# Patient Record
Sex: Male | Born: 1935 | Race: White | Hispanic: No | Marital: Married | Smoking: Former smoker
Health system: Southern US, Community
[De-identification: ages and names within clinical notes are randomized; demographics above are authoritative.]

## PROBLEM LIST (undated history)

## (undated) DIAGNOSIS — Z8619 Personal history of other infectious and parasitic diseases: Secondary | ICD-10-CM

## (undated) DIAGNOSIS — I10 Essential (primary) hypertension: Secondary | ICD-10-CM

## (undated) DIAGNOSIS — Z8781 Personal history of (healed) traumatic fracture: Secondary | ICD-10-CM

## (undated) DIAGNOSIS — K629 Disease of anus and rectum, unspecified: Secondary | ICD-10-CM

## (undated) DIAGNOSIS — Z0000 Encounter for general adult medical examination without abnormal findings: Principal | ICD-10-CM

## (undated) DIAGNOSIS — K219 Gastro-esophageal reflux disease without esophagitis: Secondary | ICD-10-CM

## (undated) DIAGNOSIS — T7840XA Allergy, unspecified, initial encounter: Secondary | ICD-10-CM

## (undated) DIAGNOSIS — Z86010 Personal history of colonic polyps: Secondary | ICD-10-CM

## (undated) DIAGNOSIS — C61 Malignant neoplasm of prostate: Secondary | ICD-10-CM

## (undated) DIAGNOSIS — E785 Hyperlipidemia, unspecified: Secondary | ICD-10-CM

## (undated) HISTORY — DX: Encounter for general adult medical examination without abnormal findings: Z00.00

## (undated) HISTORY — DX: Hyperlipidemia, unspecified: E78.5

## (undated) HISTORY — DX: Personal history of other infectious and parasitic diseases: Z86.19

## (undated) HISTORY — DX: Gastro-esophageal reflux disease without esophagitis: K21.9

## (undated) HISTORY — DX: Personal history of (healed) traumatic fracture: Z87.81

## (undated) HISTORY — DX: Personal history of colonic polyps: Z86.010

## (undated) HISTORY — DX: Essential (primary) hypertension: I10

## (undated) HISTORY — PX: TONSILLECTOMY AND ADENOIDECTOMY: SHX28

## (undated) HISTORY — DX: Malignant neoplasm of prostate: C61

## (undated) HISTORY — DX: Disease of anus and rectum, unspecified: K62.9

## (undated) HISTORY — DX: Allergy, unspecified, initial encounter: T78.40XA

---

## 2004-02-03 LAB — HM COLONOSCOPY: HM Colonoscopy: NORMAL

## 2004-05-19 ENCOUNTER — Encounter: Payer: Self-pay | Admitting: Internal Medicine

## 2005-09-23 ENCOUNTER — Ambulatory Visit: Payer: Self-pay | Admitting: Internal Medicine

## 2006-03-19 ENCOUNTER — Ambulatory Visit: Payer: Self-pay | Admitting: Internal Medicine

## 2006-08-26 DIAGNOSIS — Z860100 Personal history of colon polyps, unspecified: Secondary | ICD-10-CM | POA: Insufficient documentation

## 2006-08-26 DIAGNOSIS — K629 Disease of anus and rectum, unspecified: Secondary | ICD-10-CM

## 2006-08-26 HISTORY — DX: Personal history of colonic polyps: Z86.010

## 2006-08-26 HISTORY — DX: Disease of anus and rectum, unspecified: K62.9

## 2006-08-26 HISTORY — DX: Personal history of colon polyps, unspecified: Z86.0100

## 2006-09-20 ENCOUNTER — Ambulatory Visit: Payer: Self-pay | Admitting: Internal Medicine

## 2006-09-20 LAB — CONVERTED CEMR LAB
ALT: 20 units/L (ref 0–53)
AST: 21 units/L (ref 0–37)
Albumin: 3.9 g/dL (ref 3.5–5.2)
Alkaline Phosphatase: 56 units/L (ref 39–117)
BUN: 7 mg/dL (ref 6–23)
Basophils Absolute: 0 10*3/uL (ref 0.0–0.1)
Basophils Relative: 0.4 % (ref 0.0–1.0)
Bilirubin Urine: NEGATIVE
Bilirubin, Direct: 0.1 mg/dL (ref 0.0–0.3)
CO2: 31 meq/L (ref 19–32)
Calcium: 9.8 mg/dL (ref 8.4–10.5)
Chloride: 107 meq/L (ref 96–112)
Cholesterol: 274 mg/dL (ref 0–200)
Creatinine, Ser: 0.8 mg/dL (ref 0.4–1.5)
Direct LDL: 204.1 mg/dL
Eosinophils Absolute: 0.3 10*3/uL (ref 0.0–0.6)
Eosinophils Relative: 4.7 % (ref 0.0–5.0)
GFR calc Af Amer: 123 mL/min
GFR calc non Af Amer: 102 mL/min
Glucose, Bld: 122 mg/dL — ABNORMAL HIGH (ref 70–99)
Glucose, Urine, Semiquant: NEGATIVE
HCT: 43.4 % (ref 39.0–52.0)
HDL: 42.7 mg/dL (ref >39.0)
Hemoglobin: 14.9 g/dL (ref 13.0–17.0)
Ketones, urine, test strip: NEGATIVE
Lymphocytes Relative: 24.3 % (ref 12.0–46.0)
MCHC: 34.4 g/dL (ref 30.0–36.0)
MCV: 91.7 fL (ref 78.0–100.0)
Monocytes Absolute: 0.6 10*3/uL (ref 0.2–0.7)
Monocytes Relative: 10 % (ref 3.0–11.0)
Neutro Abs: 3.3 10*3/uL (ref 1.4–7.7)
Neutrophils Relative %: 60.6 % (ref 43.0–77.0)
Nitrite: NEGATIVE
PSA: 3.68 ng/mL (ref 0.10–4.00)
Platelets: 190 10*3/uL (ref 150–400)
Potassium: 3.8 meq/L (ref 3.5–5.1)
Protein, U semiquant: NEGATIVE
RBC: 4.73 M/uL (ref 4.22–5.81)
RDW: 12.6 % (ref 11.5–14.6)
Sodium: 143 meq/L (ref 135–145)
Specific Gravity, Urine: 1.015
TSH: 1.55 microintl units/mL (ref 0.35–5.50)
Total Bilirubin: 0.8 mg/dL (ref 0.3–1.2)
Total CHOL/HDL Ratio: 6.4
Total Protein: 6.6 g/dL (ref 6.0–8.3)
Triglycerides: 95 mg/dL (ref 0–149)
Urobilinogen, UA: NEGATIVE
VLDL: 19 mg/dL (ref 0–40)
WBC Urine, dipstick: NEGATIVE
WBC: 5.5 10*3/uL (ref 4.5–10.5)
pH: 7

## 2006-09-27 ENCOUNTER — Ambulatory Visit: Payer: Self-pay | Admitting: Internal Medicine

## 2006-09-28 ENCOUNTER — Ambulatory Visit: Payer: Self-pay | Admitting: Internal Medicine

## 2006-09-28 LAB — CONVERTED CEMR LAB
ALT: 25 units/L (ref 0–53)
AST: 28 units/L (ref 0–37)
Albumin: 3.7 g/dL (ref 3.5–5.2)
Alkaline Phosphatase: 49 units/L (ref 39–117)
BUN: 8 mg/dL (ref 6–23)
Bilirubin, Direct: 0.1 mg/dL (ref 0.0–0.3)
CO2: 30 meq/L (ref 19–32)
Calcium: 9.4 mg/dL (ref 8.4–10.5)
Chloride: 107 meq/L (ref 96–112)
Cholesterol: 266 mg/dL (ref 0–200)
Creatinine, Ser: 0.8 mg/dL (ref 0.4–1.5)
Direct LDL: 192.4 mg/dL
GFR calc Af Amer: 123 mL/min
GFR calc non Af Amer: 102 mL/min
Glucose, Bld: 111 mg/dL — ABNORMAL HIGH (ref 70–99)
HDL: 40.9 mg/dL (ref >39.0)
Potassium: 3.6 meq/L (ref 3.5–5.1)
Sodium: 143 meq/L (ref 135–145)
Total Bilirubin: 0.7 mg/dL (ref 0.3–1.2)
Total CHOL/HDL Ratio: 6.5
Total Protein: 6.4 g/dL (ref 6.0–8.3)
Triglycerides: 186 mg/dL — ABNORMAL HIGH (ref 0–149)
VLDL: 37 mg/dL (ref 0–40)

## 2006-09-29 ENCOUNTER — Encounter: Payer: Self-pay | Admitting: Internal Medicine

## 2006-11-03 ENCOUNTER — Ambulatory Visit: Payer: Self-pay | Admitting: Internal Medicine

## 2006-11-03 LAB — CONVERTED CEMR LAB
ALT: 19 units/L (ref 0–53)
AST: 20 units/L (ref 0–37)
Albumin: 3.8 g/dL (ref 3.5–5.2)
Alkaline Phosphatase: 60 units/L (ref 39–117)
Bilirubin, Direct: 0.2 mg/dL (ref 0.0–0.3)
Cholesterol: 216 mg/dL (ref 0–200)
Direct LDL: 151.6 mg/dL
HDL: 46.3 mg/dL (ref >39.0)
Total Bilirubin: 0.9 mg/dL (ref 0.3–1.2)
Total CHOL/HDL Ratio: 4.7
Total Protein: 6.4 g/dL (ref 6.0–8.3)
Triglycerides: 96 mg/dL (ref 0–149)
VLDL: 19 mg/dL (ref 0–40)

## 2006-11-09 ENCOUNTER — Ambulatory Visit: Payer: Self-pay | Admitting: Internal Medicine

## 2006-11-09 DIAGNOSIS — E785 Hyperlipidemia, unspecified: Secondary | ICD-10-CM

## 2006-11-09 HISTORY — DX: Hyperlipidemia, unspecified: E78.5

## 2006-11-22 ENCOUNTER — Telehealth: Payer: Self-pay | Admitting: Internal Medicine

## 2007-02-02 ENCOUNTER — Telehealth: Payer: Self-pay | Admitting: Internal Medicine

## 2007-02-03 HISTORY — PX: PROSTATECTOMY: SHX69

## 2007-02-15 ENCOUNTER — Telehealth: Payer: Self-pay | Admitting: Internal Medicine

## 2007-02-23 ENCOUNTER — Ambulatory Visit: Payer: Self-pay | Admitting: Internal Medicine

## 2007-04-18 ENCOUNTER — Ambulatory Visit: Payer: Self-pay | Admitting: Internal Medicine

## 2007-04-18 LAB — CONVERTED CEMR LAB
Bilirubin, Direct: 0.1 mg/dL (ref 0.0–0.3)
Cholesterol: 206 mg/dL (ref 0–200)
Direct LDL: 148.1 mg/dL
HDL: 38.2 mg/dL — ABNORMAL LOW (ref >39.0)
Total CHOL/HDL Ratio: 5.4
Total Protein: 6.3 g/dL (ref 6.0–8.3)
Triglycerides: 89 mg/dL (ref 0–149)
VLDL: 18 mg/dL (ref 0–40)

## 2007-04-25 ENCOUNTER — Ambulatory Visit: Payer: Self-pay | Admitting: Internal Medicine

## 2007-04-25 DIAGNOSIS — K219 Gastro-esophageal reflux disease without esophagitis: Secondary | ICD-10-CM

## 2007-04-25 HISTORY — DX: Gastro-esophageal reflux disease without esophagitis: K21.9

## 2007-04-27 ENCOUNTER — Telehealth: Payer: Self-pay | Admitting: Internal Medicine

## 2007-05-03 ENCOUNTER — Telehealth: Payer: Self-pay | Admitting: Internal Medicine

## 2007-05-12 ENCOUNTER — Ambulatory Visit: Payer: Self-pay | Admitting: Internal Medicine

## 2007-05-17 LAB — CONVERTED CEMR LAB: PSA: 4.61 ng/mL — ABNORMAL HIGH (ref 0.10–4.00)

## 2007-05-24 ENCOUNTER — Encounter: Payer: Self-pay | Admitting: Internal Medicine

## 2007-09-26 ENCOUNTER — Encounter: Payer: Self-pay | Admitting: Internal Medicine

## 2007-10-11 ENCOUNTER — Ambulatory Visit: Admission: RE | Admit: 2007-10-11 | Discharge: 2007-10-18 | Payer: Self-pay | Admitting: Radiation Oncology

## 2007-10-12 ENCOUNTER — Encounter: Payer: Self-pay | Admitting: Internal Medicine

## 2007-10-19 ENCOUNTER — Ambulatory Visit: Payer: Self-pay | Admitting: Internal Medicine

## 2007-10-19 LAB — CONVERTED CEMR LAB
AST: 17 units/L (ref 0–37)
Albumin: 3.9 g/dL (ref 3.5–5.2)
Alkaline Phosphatase: 61 units/L (ref 39–117)
Bilirubin, Direct: 0.1 mg/dL (ref 0.0–0.3)
Cholesterol: 191 mg/dL (ref 0–200)
Total Protein: 6.4 g/dL (ref 6.0–8.3)

## 2007-10-26 ENCOUNTER — Ambulatory Visit: Payer: Self-pay | Admitting: Internal Medicine

## 2007-10-26 DIAGNOSIS — C61 Malignant neoplasm of prostate: Secondary | ICD-10-CM

## 2007-10-26 DIAGNOSIS — Z8546 Personal history of malignant neoplasm of prostate: Secondary | ICD-10-CM | POA: Insufficient documentation

## 2007-10-26 HISTORY — DX: Malignant neoplasm of prostate: C61

## 2007-12-07 ENCOUNTER — Inpatient Hospital Stay (HOSPITAL_COMMUNITY): Admission: RE | Admit: 2007-12-07 | Discharge: 2007-12-08 | Payer: Self-pay | Admitting: Urology

## 2007-12-07 ENCOUNTER — Encounter (INDEPENDENT_AMBULATORY_CARE_PROVIDER_SITE_OTHER): Payer: Self-pay | Admitting: Urology

## 2007-12-08 ENCOUNTER — Emergency Department (HOSPITAL_BASED_OUTPATIENT_CLINIC_OR_DEPARTMENT_OTHER): Admission: EM | Admit: 2007-12-08 | Discharge: 2007-12-08 | Payer: Self-pay | Admitting: Emergency Medicine

## 2008-01-09 ENCOUNTER — Telehealth: Payer: Self-pay | Admitting: Internal Medicine

## 2008-03-26 ENCOUNTER — Telehealth: Payer: Self-pay | Admitting: Internal Medicine

## 2008-03-27 ENCOUNTER — Ambulatory Visit: Payer: Self-pay | Admitting: Internal Medicine

## 2008-04-04 ENCOUNTER — Ambulatory Visit: Payer: Self-pay | Admitting: Internal Medicine

## 2008-04-06 LAB — CONVERTED CEMR LAB
ALT: 29 units/L (ref 0–53)
AST: 26 units/L (ref 0–37)
Bilirubin, Direct: 0.2 mg/dL (ref 0.0–0.3)
Direct LDL: 164.3 mg/dL
HDL: 42.2 mg/dL (ref >39.0)
Total Bilirubin: 0.8 mg/dL (ref 0.3–1.2)
Total CHOL/HDL Ratio: 5.3
Triglycerides: 67 mg/dL (ref 0–149)

## 2008-06-19 ENCOUNTER — Encounter: Payer: Self-pay | Admitting: Internal Medicine

## 2008-07-17 ENCOUNTER — Ambulatory Visit: Payer: Self-pay | Admitting: Internal Medicine

## 2008-07-17 LAB — CONVERTED CEMR LAB
ALT: 19 units/L (ref 0–53)
Bilirubin, Direct: 0.1 mg/dL (ref 0.0–0.3)
Cholesterol: 177 mg/dL (ref 0–200)
HDL: 36.5 mg/dL — ABNORMAL LOW (ref >39.00)
LDL Cholesterol: 115 mg/dL — ABNORMAL HIGH (ref 0–99)
Total Protein: 7 g/dL (ref 6.0–8.3)
VLDL: 25.2 mg/dL (ref 0.0–40.0)

## 2008-07-24 ENCOUNTER — Ambulatory Visit: Payer: Self-pay | Admitting: Internal Medicine

## 2008-08-01 ENCOUNTER — Telehealth: Payer: Self-pay | Admitting: Internal Medicine

## 2008-10-12 ENCOUNTER — Ambulatory Visit: Payer: Self-pay | Admitting: Internal Medicine

## 2009-01-11 ENCOUNTER — Ambulatory Visit: Payer: Self-pay | Admitting: Internal Medicine

## 2009-01-14 LAB — CONVERTED CEMR LAB
ALT: 19 units/L (ref 0–53)
Alkaline Phosphatase: 74 units/L (ref 39–117)
Bilirubin, Direct: 0 mg/dL (ref 0.0–0.3)
CO2: 31 meq/L (ref 19–32)
Chloride: 106 meq/L (ref 96–112)
Creatinine, Ser: 1 mg/dL (ref 0.4–1.5)
LDL Cholesterol: 127 mg/dL — ABNORMAL HIGH (ref 0–99)
Potassium: 4.2 meq/L (ref 3.5–5.1)
Sodium: 142 meq/L (ref 135–145)
Total Bilirubin: 0.7 mg/dL (ref 0.3–1.2)
Total CHOL/HDL Ratio: 5

## 2009-03-04 ENCOUNTER — Telehealth: Payer: Self-pay | Admitting: Internal Medicine

## 2009-03-06 ENCOUNTER — Ambulatory Visit: Payer: Self-pay | Admitting: Internal Medicine

## 2009-04-08 ENCOUNTER — Encounter: Payer: Self-pay | Admitting: Internal Medicine

## 2009-04-10 ENCOUNTER — Telehealth: Payer: Self-pay | Admitting: Internal Medicine

## 2009-04-12 ENCOUNTER — Encounter: Payer: Self-pay | Admitting: Internal Medicine

## 2009-04-23 ENCOUNTER — Encounter: Payer: Self-pay | Admitting: Internal Medicine

## 2009-04-24 ENCOUNTER — Ambulatory Visit: Payer: Self-pay | Admitting: Internal Medicine

## 2009-05-13 ENCOUNTER — Telehealth: Payer: Self-pay | Admitting: Internal Medicine

## 2009-07-18 ENCOUNTER — Encounter: Payer: Self-pay | Admitting: Internal Medicine

## 2009-08-28 ENCOUNTER — Telehealth: Payer: Self-pay | Admitting: Internal Medicine

## 2009-08-29 ENCOUNTER — Ambulatory Visit: Payer: Self-pay | Admitting: Internal Medicine

## 2009-09-01 ENCOUNTER — Emergency Department (HOSPITAL_BASED_OUTPATIENT_CLINIC_OR_DEPARTMENT_OTHER): Admission: EM | Admit: 2009-09-01 | Discharge: 2009-09-01 | Payer: Self-pay | Admitting: Emergency Medicine

## 2009-09-01 ENCOUNTER — Ambulatory Visit: Payer: Self-pay | Admitting: Diagnostic Radiology

## 2009-10-30 ENCOUNTER — Ambulatory Visit: Payer: Self-pay | Admitting: Internal Medicine

## 2009-11-01 LAB — CONVERTED CEMR LAB
ALT: 18 units/L (ref 0–53)
AST: 18 units/L (ref 0–37)
Basophils Relative: 0.5 % (ref 0.0–3.0)
Cholesterol: 187 mg/dL (ref 0–200)
HCT: 43.4 % (ref 39.0–52.0)
Hemoglobin: 14.9 g/dL (ref 13.0–17.0)
LDL Cholesterol: 117 mg/dL — ABNORMAL HIGH (ref 0–99)
Lymphocytes Relative: 15.8 % (ref 12.0–46.0)
MCHC: 34.3 g/dL (ref 30.0–36.0)
Monocytes Relative: 8.1 % (ref 3.0–12.0)
Neutro Abs: 6.4 10*3/uL (ref 1.4–7.7)
RBC: 4.84 M/uL (ref 4.22–5.81)
Total Bilirubin: 0.8 mg/dL (ref 0.3–1.2)
Total CHOL/HDL Ratio: 4
Total Protein: 7 g/dL (ref 6.0–8.3)

## 2010-01-20 ENCOUNTER — Encounter: Payer: Self-pay | Admitting: Internal Medicine

## 2010-02-24 ENCOUNTER — Ambulatory Visit
Admission: RE | Admit: 2010-02-24 | Discharge: 2010-02-24 | Payer: Self-pay | Source: Home / Self Care | Attending: Internal Medicine | Admitting: Internal Medicine

## 2010-02-25 ENCOUNTER — Ambulatory Visit: Admit: 2010-02-25 | Payer: Self-pay | Admitting: Internal Medicine

## 2010-02-26 ENCOUNTER — Encounter: Payer: Self-pay | Admitting: Internal Medicine

## 2010-02-26 ENCOUNTER — Ambulatory Visit
Admission: RE | Admit: 2010-02-26 | Discharge: 2010-02-26 | Payer: Self-pay | Source: Home / Self Care | Attending: Internal Medicine | Admitting: Internal Medicine

## 2010-03-01 ENCOUNTER — Encounter: Payer: Self-pay | Admitting: Internal Medicine

## 2010-03-04 NOTE — Assessment & Plan Note (Signed)
Summary: 3 month rov/njr/pt rescd from bump//ccm   Vital Signs:  Patient profile:   75 year old male Height:      68 inches Weight:      196 pounds BMI:     29.91 Temp:     96.0 degrees F oral BP sitting:   160 / 90  (left arm) Cuff size:   regular  Vitals Entered By: Kern Reap CMA Duncan Dull) (April 24, 2009 9:52 AM)  Serial Vital Signs/Assessments:  Time      Position  BP       Pulse  Resp  Temp     By                     138/88                         Birdie Sons MD  CC: follow-up visit Is Patient Diabetic? No   CC:  follow-up visit.  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 One By Mouth Daily 2)  Simvastatin 40 Mg Tabs (Simvastatin) .... Take One Tablet At Bedtime 3)  Omeprazole 20 Mg Cpdr (Omeprazole) .... One By Mouth Daily 4)  Vitamin C 500 Mg Tabs (Ascorbic Acid) .... Once Daily 5)  Meloxicam 15 Mg Tabs (Meloxicam) .... Take One Tab Once Daily  Allergies (verified): 1)  ! Penicillin 2)  ! Erythromycin 3)  ! Sulfa 4)  ! Levaquin 5)  ! Tetracycline Hcl (Tetracycline Hcl) 6)  ! * Mycins  Past History:  Past Medical History: Last updated: 04/25/2007 Colonic polyps, hx of Hyperlipidemia GERD  Past Surgical History: Last updated: 01/11/2009 Tonsillectomy, adenoidectomy limb fx Prostatectomy---2009  Family History: Last updated: 09/27/2006 father RMSF mother--96 yo--dementia  Social History: Last updated: 09/27/2006 wife with aphasia Married  Risk Factors: Smoking Status: quit > 6 months  (03/06/2009)  Physical Exam  General:  alert, well-developed, well-nourished, and well-hydrated, no distress Head:  normocephalic and atraumatic.   Eyes:  pupils equal and pupils round.   Ears:  R ear normal and L ear normal.   Neck:  supple, full ROM, and no masses.   Lungs:  normal respiratory effort.   Heart:  normal rate and regular rhythm.   Abdomen:  soft, non-tender, normal bowel sounds, no distention, no masses, no guarding, no rigidity, and no rebound tenderness.   Msk:  normal ROM and no joint tenderness.   Neurologic:  cranial nerves II-XII intact and gait normal.     Impression & Recommendations:  Problem # 1:  HYPERLIPIDEMIA (ICD-272.4) well controlled previously labs next visit.  His updated medication list for this problem includes:    Simvastatin 40 Mg Tabs (Simvastatin) .Marland Kitchen... Take one tablet at bedtime  Labs Reviewed: SGOT: 19 (01/11/2009)   SGPT: 19 (01/11/2009)   HDL:34.60 (01/11/2009), 36.50 (07/17/2008)  LDL:127 (01/11/2009), 115 (07/17/2008)  Chol:181 (01/11/2009), 177 (07/17/2008)  Trig:99.0 (01/11/2009), 126.0 (07/17/2008)  Problem # 2:  GERD (ICD-530.81) well controlled.  His updated medication list for this problem includes:    Omeprazole  20 Mg Cpdr (Omeprazole) ..... One by mouth daily  Labs Reviewed: Hgb: 14.9 (09/20/2006)   Hct: 43.4 (09/20/2006)  Problem # 3:  ADENOCARCINOMA, PROSTATE (ICD-185) no known recurrence sees urology regularly recent uri--taking pseudofed. ? related to BP---see serial assessment.   Complete Medication List: 1)  Diasense Multivitamin Tabs (Multiple vitamins-minerals) .... One by mouth daily 2)  Simvastatin 40 Mg Tabs (Simvastatin) .... Take one tablet at bedtime 3)  Omeprazole 20 Mg Cpdr (Omeprazole) .... One by mouth daily 4)  Vitamin C 500 Mg Tabs (Ascorbic acid) .... Once daily 5)  Meloxicam 15 Mg Tabs (Meloxicam) .... Take one tab once daily

## 2010-03-04 NOTE — Letter (Signed)
Summary: [facility]  [facility]   Imported By: Maryln Gottron 04/30/2009 14:21:03  _____________________________________________________________________  External Attachment:    Type:   Image     Comment:   External Document

## 2010-03-04 NOTE — Progress Notes (Signed)
Summary: request  Phone Note Other Incoming Call back at [phone] x [phone]   Caller: Ed Blalock, [facility] live call Summary of Call: Pt called them for PT for right hand pain and right knee pain.  Request order for same faxed to [phone] Initial call taken by: Gladis Riffle, RN,  March 04, 2009 3:47 PM  Follow-up for Phone Call        called pt.  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  He describes this as dull, constant pain x 2 years.  Wonders if has twisted right knee from kicking ball to dog.  Now too painful to walk treadmill and has been x 3-4 weeks. Follow-up by: Gladis Riffle, RN,  March 04, 2009 3:52 PM  Additional Follow-up for Phone Call Additional follow up Details #1::        sounds like he should be seen. i'll see him at 65 on Wed Additional Follow-up by: Birdie Sons MD,  March 04, 2009 8:50 PM    Additional Follow-up for Phone Call Additional follow up Details #2::    appt made with pt as directed Follow-up by: Gladis Riffle, RN,  March 05, 2009 8:22 AM

## 2010-03-04 NOTE — Assessment & Plan Note (Signed)
Summary: right hand and knee pain/et--coming at 7:45 per MD   Vital Signs:  Patient profile:   75 year old male Weight:      196 pounds Temp:     97.5 degrees F Pulse rate:   72 / minute Resp:     12 per minute BP sitting:   140 / 84  (left arm)  Vitals Entered By: Gladis Riffle, RN (March 06, 2009 7:58 AM) CC: c/o right thumb pain and right knee pain, requests PT Is Patient Diabetic? No   CC:  c/o right thumb pain and right knee pain and requests PT.  History of Present Illness: base r thumb pain--progressive for 3 years---he thinks related to fly fishing. no swelling, no erythema  Right knee pain---ongoing for months---progressively worse...has noted since kicking ball with medial aspect of foot. no swelling , no erythema. he has noted that after 15 minute walk on treadmill caused significant r knee pain.  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 One By Mouth Daily 2)  Simvastatin 40 Mg Tabs (Simvastatin) .... Take One Tablet At Bedtime 3)  Omeprazole 20 Mg Cpdr (Omeprazole) .... One By Mouth Daily 4)  Vitamin C 500 Mg Tabs (Ascorbic Acid) .... Once Daily  Current Medications (verified): 1)  Diasense Multivitamin   Tabs (Multiple Vitamins-Minerals) .... One By Mouth Daily 2)  Simvastatin 40 Mg Tabs (Simvastatin) .... Take One Tablet At Bedtime 3)  Omeprazole 20 Mg Cpdr (Omeprazole) .... One By Mouth Daily 4)  Vitamin C 500 Mg Tabs (Ascorbic Acid) .... Once Daily  Allergies: 1)  ! Penicillin 2)  ! Erythromycin 3)  ! Sulfa 4)   ! Levaquin 5)  ! Tetracycline Hcl (Tetracycline Hcl) 6)  ! * Mycins  Comments:  Nurse/Medical Assistant: c/o right thumb and right knee pain  The patient's medications and allergies were reviewed with the patient and were updated in the Medication and Allergy Lists. Gladis Riffle, RN (March 06, 2009 8:00 AM)  Past History:  Past Medical History: Last updated: 04/25/2007 Colonic polyps, hx of Hyperlipidemia GERD  Past Surgical History: Last updated: 01/11/2009 Tonsillectomy, adenoidectomy limb fx Prostatectomy---2009  Family History: Last updated: 09/27/2006 father RMSF mother--96 yo--dementia  Social History: Last updated: 09/27/2006 wife with aphasia Married  Risk Factors: Smoking Status: quit > 6 months (03/06/2009)  Review of Systems       All other systems reviewed and were negative   Physical Exam  General:  alert, well-developed, well-nourished, and well-hydrated, no distress Head:  normocephalic and atraumatic.   Msk:  FROM both knees without effusion  FROM both wrists and thumbs no swelling or erythema Pulses:  R radial normal and L radial normal.   Extremities:  No clubbing, cyanosis, edema, or deformity noted  Neurologic:  cranial nerves II-XII intact and gait normal.     Impression & Recommendations:  Problem # 1:  HAND PAIN, RIGHT (ICD-729.5) suspect tendonitis---ok to go to PT Orders: T-Knee Right 2 view ([id number]) Prescription Created Electronically [id number]) Physical Therapy Referral (  PT)  Problem # 2:  KNEE PAIN, RIGHT (ICD-719.46) ? cause check xray order PT side effects of meds discussed Orders: T-Knee Right 2 view ([id number]) Physical Therapy Referral (PT)  His updated medication list for this problem includes:    Meloxicam 7.5 Mg Tabs (Meloxicam) ..... One by mouth daily with food  Complete Medication List: 1)  Diasense Multivitamin Tabs (Multiple vitamins-minerals) .... One by mouth daily 2)  Simvastatin 40 Mg Tabs  (Simvastatin) .... Take one tablet at bedtime 3)  Omeprazole 20 Mg Cpdr (Omeprazole) .... One by mouth daily 4)  Vitamin C 500 Mg Tabs (Ascorbic acid) .... Once daily 5)  Meloxicam 7.5 Mg Tabs (Meloxicam) .... One by mouth daily with food Prescriptions: MELOXICAM 7.5 MG  TABS (MELOXICAM) one by mouth daily with food  #30 x 0   Entered and Authorized by:   Birdie Sons MD   Signed by:   Birdie Sons MD on 03/06/2009   Method used:   Electronically to        [facility] [address] (retail)       [address]       [place], [place]  [address]       Ph: [phone] or [phone]       Fax: [phone]   RxID:   [id number]

## 2010-03-04 NOTE — Progress Notes (Signed)
Summary: [facility] 08/28/2009  Phone Note Call from Patient   Caller: Patient Call For: Birdie Sons MD Summary of Call: Pt is complaining of chronic hoarseness.  LMTCB Initial call taken by: Lynann Beaver CMA,  August 28, 2009 1:11 PM  Follow-up for Phone Call        Appt scheduled with Dr. Kirtland Bouchard tomorrow. Follow-up by: Lynann Beaver CMA,  August 28, 2009 1:18 PM

## 2010-03-04 NOTE — Progress Notes (Signed)
Summary: referral  Phone Note Call from Patient   Caller: Patient Call For: Birdie Sons MD Reason for Call: Talk to Doctor Summary of Call: has been going to PT x 2 weeks for knee problem. Had xrays. PT is recommending ortho because of a ligament problem. Asking for referral to Dr Darden Palmer in [facility] or anyone you would recommend. Initial call taken by: Raechel Ache, RN,  April 10, 2009 1:37 PM  Follow-up for Phone Call        i am not familiar with that orthopedist but it is ok with me if he goes.   i would recommend beane, supple, landau, caffrey, lucey, [facility]field Follow-up by: Birdie Sons MD,  April 10, 2009 2:01 PM  Additional Follow-up for Phone Call Additional follow up Details #1::        Phone Call Completed, sent to Terri Additional Follow-up by: Raechel Ache, RN,  April 10, 2009 2:13 PM  New Problems: KNEE PAIN, RIGHT (ICD-719.46)   New Problems: KNEE PAIN, RIGHT (ICD-719.46)

## 2010-03-04 NOTE — Assessment & Plan Note (Signed)
Summary: hoarseness/dm   Vital Signs:  Patient profile:   75 year old male Weight:      194 pounds Temp:     98.4 degrees F oral BP sitting:   118 / 70  (right arm) Cuff size:   regular  Vitals Entered By: Duard Brady LPN (August 29, 2009 4:15 PM) CC: c/o hoarsness , post nasal drip Is Patient Diabetic? No   CC:  c/o hoarsness  and post nasal drip.  History of Present Illness: 75 year old patient who presents with the chief complaint of hoarseness and postnasal drip.  this has been an intermittent problem for some time.  He states in the spring.  He also has mild related symptoms.  He feels that his present symptoms are often exacerbated by the heating and cooling system at his retirement facility.  He denies any fever, productive cough, headache, shortness or breath.  Preventive Screening-Counseling & Management  Alcohol-Tobacco     Smoking Status: quit  Allergies: 1)  ! Penicillin 2)  ! Erythromycin 3)  ! Sulfa 4)  ! Levaquin 5)  ! Tetracycline Hcl (Tetracycline Hcl) 6)  ! * Mycins  Past History:  Past Medical History: Reviewed history from 04/25/2007 and no changes required. Colonic polyps, hx of Hyperlipidemia GERD  Social History: Smoking Status:  quit  Review of Systems       The patient complains of hoarseness.  The patient denies anorexia, fever, weight loss, weight gain, vision loss, decreased hearing, chest pain, syncope, dyspnea on exertion, peripheral edema, prolonged cough, headaches, hemoptysis, abdominal pain, melena, hematochezia, severe indigestion/heartburn, hematuria, incontinence, genital sores, muscle weakness, suspicious skin lesions, transient blindness, difficulty walking, depression, unusual weight change, abnormal bleeding, enlarged lymph nodes, angioedema, breast masses, and testicular masses.    Physical Exam  General:  Well-developed,well-nourished,in no acute distress; alert,appropriate and cooperative throughout examination Head:   Normocephalic and atraumatic without obvious abnormalities. No apparent alopecia or balding. Eyes:  No corneal or conjunctival inflammation noted. EOMI. Perrla. Funduscopic exam benign, without hemorrhages, exudates or papilledema. Vision grossly normal. Ears:  External ear exam shows no significant lesions or deformities.  Otoscopic examination reveals clear canals, tympanic membranes are intact bilaterally without bulging, retraction, inflammation or discharge. Hearing is grossly normal bilaterally. Nose:  External nasal examination shows no deformity or inflammation. Nasal mucosa are pink and moist without lesions or exudates. Mouth:  Oral mucosa and oropharynx without lesions or exudates.  Teeth in good repair. Neck:  No deformities, masses, or tenderness noted. Lungs:  Normal respiratory effort, chest expands symmetrically. Lungs are clear to auscultation, no crackles or wheezes. Heart:  Normal rate and regular rhythm. S1 and S2 normal without gallop, murmur, click, rub or other extra sounds.   Impression & Recommendations:  Problem # 1:  HOARSENESS (ICD-784.42) salve for Clarinex, and Nasonex, dispensed  Complete Medication List: 1)  Diasense Multivitamin Tabs (Multiple vitamins-minerals) .... One by mouth daily 2)  Simvastatin 40 Mg Tabs (Simvastatin) .... Take one tablet at bedtime 3)  Omeprazole 20 Mg Cpdr (Omeprazole) .... One by mouth daily 4)  Vitamin C 500 Mg Tabs (Ascorbic acid) .... Once daily  Patient Instructions: 1)  take medications as directed 2)  call if  unimproved 3)  saline irrigation as needed

## 2010-03-04 NOTE — Miscellaneous (Signed)
Summary: Order,Evaluation, Discharge/[facility], Discharge/[facility]   Imported By: Maryln Gottron 04/30/2009 14:18:20  _____________________________________________________________________  External Attachment:    Type:   Image     Comment:   External Document

## 2010-03-04 NOTE — Progress Notes (Signed)
  Phone Note Call from Patient   Caller: Patient Call For: Birdie Sons MD Summary of Call: Pt is complaining of seasnal allergies and would like an OTC recommendatons.  Suggested Zyrtec, Claritin, Alegra, etc per Dr. Cato Mulligan. Initial call taken by: Lynann Beaver CMA,  May 13, 2009 9:57 AM

## 2010-03-04 NOTE — Miscellaneous (Signed)
Summary: Orders, Initial Evaluation/[facility]  Orders, Initial Evaluation/[facility]   Imported By: Maryln Gottron 04/11/2009 13:27:25  _____________________________________________________________________  External Attachment:    Type:   Image     Comment:   External Document

## 2010-03-04 NOTE — Assessment & Plan Note (Signed)
Summary: 6 month rov/njr rsc bmp/njr   Vital Signs:  Patient profile:   75 year old male Height:      68 inches (172.72 cm) Weight:      200 pounds (90.91 kg) BMI:     30.52 Temp:     98.5 degrees F (36.94 degrees C) oral BP sitting:   140 / 90  (left arm) Cuff size:   regular  Vitals Entered By: Lucious Groves CMA (October 30, 2009 8:52 AM) CC: 6 mo rtn ov./kb Is Patient Diabetic? No Pain Assessment Patient in pain? no      Comments No refills needed at this time./kb   CC:  6 mo rtn ov./kb.  History of Present Illness:  Follow-Up Visit      This is a 75 year old man who presents for Follow-up visit.  The patient denies chest pain and palpitations.  Since the last visit the patient notes no new problems or concerns.  The patient reports taking meds as prescribed.  When questioned about possible medication side effects, the patient notes none.  Sxs of GERD resolved on PPI  All other systems reviewed and were negative   Current Problems (verified): 1)  Adenocarcinoma, Prostate  (ICD-185) 2)  Gerd  (ICD-530.81) 3)  Hyperlipidemia  (ICD-272.4) 4)  Well Adult Exam  (ICD-V70.0) 5)  Colonic Polyps, Hx of  (ICD-V12.72)  Current Medications (verified): 1)  Diasense Multivitamin   Tabs (Multiple Vitamins-Minerals) .... One By Mouth Daily 2)  Simvastatin 40 Mg Tabs (Simvastatin) .... Take One Tablet At Bedtime 3)  Omeprazole 20 Mg Cpdr (Omeprazole) .... One By Mouth Daily 4)  Vitamin C 500 Mg Tabs (Ascorbic Acid) .... Once Daily  Allergies (verified): 1)  ! Penicillin 2)  ! Erythromycin 3)  ! Sulfa 4)  ! Levaquin 5)  ! Tetracycline Hcl (Tetracycline Hcl) 6)  ! * Mycins  Past History:  Past Medical History: Last updated: 04/25/2007 Colonic polyps, hx of Hyperlipidemia GERD  Past Surgical History: Last updated: 01/11/2009 Tonsillectomy, adenoidectomy limb fx Prostatectomy---2009  Family History: Last updated: 09/27/2006 father RMSF mother--96  yo--dementia  Social History: Last updated: 09/27/2006 wife with aphasia Married  Risk Factors: Smoking Status: quit (08/29/2009)  Physical Exam  General:  alert and well-developed.   Head:  normocephalic and atraumatic.   Eyes:  pupils equal and pupils round.   Neck:  No deformities, masses, or tenderness noted. Chest Wall:  No deformities, masses, tenderness or gynecomastia noted. Lungs:  Normal respiratory effort, chest expands symmetrically. Lungs are clear to auscultation, no crackles or wheezes. Heart:  normal rate and regular rhythm.   Abdomen:  soft, non-tender, normal bowel sounds, no distention, no masses, no guarding, no rigidity, and no rebound tenderness.   Skin:  turgor normal and color normal.   Psych:  good eye contact and not anxious appearing.     Impression & Recommendations:  Problem # 1:  GERD (ICD-530.81)  controlled His updated medication list for this problem includes:    Omeprazole 20 Mg Cpdr (Omeprazole) ..... One by mouth daily  Labs Reviewed: Hgb: 14.9 (09/20/2006)   Hct: 43.4 (09/20/2006)  Orders: TLB-CBC Platelet - w/Differential (85025-CBCD)  Problem # 2:  HYPERLIPIDEMIA (ICD-272.4)  continue current medications  His updated medication list for this problem includes:    Simvastatin 40 Mg Tabs (Simvastatin) .Marland Kitchen... Take one tablet at bedtime  Labs Reviewed: SGOT: 19 (01/11/2009)   SGPT: 19 (01/11/2009)   HDL:34.60 (01/11/2009), 36.50 (07/17/2008)  LDL:127 (01/11/2009), 115 (07/17/2008)  Chol:181 (01/11/2009), 177 (07/17/2008)  Trig:99.0 (01/11/2009), 126.0 (07/17/2008)  Orders: Venipuncture (16109) TLB-Hepatic/Liver Function Pnl (80076-HEPATIC) TLB-Lipid Panel (80061-LIPID)  Problem # 3:  ADENOCARCINOMA, PROSTATE (ICD-185) has f/u with urology note BP  will follow  Complete Medication List: 1)  Diasense Multivitamin Tabs (Multiple vitamins-minerals) .... One by mouth daily 2)  Simvastatin 40 Mg Tabs (Simvastatin) .... Take one  tablet at bedtime 3)  Omeprazole 20 Mg Cpdr (Omeprazole) .... One by mouth daily 4)  Vitamin C 500 Mg Tabs (Ascorbic acid) .... Once daily  Patient Instructions: 1)  Please schedule a follow-up appointment in 4 months.  Preventive Care Screening  Last Flu Shot:    Date:  09/02/2009    Results:  Historical    Appended Document: Orders Update     Clinical Lists Changes  Orders: Added new Service order of Specimen Handling ([id number]) - Signed

## 2010-03-04 NOTE — Letter (Signed)
Summary: [facility]  [facility]   Imported By: Maryln Gottron 07/29/2009 14:54:40  _____________________________________________________________________  External Attachment:    Type:   Image     Comment:   External Document

## 2010-03-05 ENCOUNTER — Ambulatory Visit (INDEPENDENT_AMBULATORY_CARE_PROVIDER_SITE_OTHER): Payer: Medicare Other | Admitting: Internal Medicine

## 2010-03-05 ENCOUNTER — Encounter: Payer: Self-pay | Admitting: Internal Medicine

## 2010-03-05 DIAGNOSIS — K219 Gastro-esophageal reflux disease without esophagitis: Secondary | ICD-10-CM

## 2010-03-05 DIAGNOSIS — C61 Malignant neoplasm of prostate: Secondary | ICD-10-CM

## 2010-03-05 DIAGNOSIS — E785 Hyperlipidemia, unspecified: Secondary | ICD-10-CM

## 2010-03-05 LAB — BASIC METABOLIC PANEL WITH GFR
BUN: 15 mg/dL (ref 6–23)
CO2: 32 meq/L (ref 19–32)
Calcium: 9.9 mg/dL (ref 8.4–10.5)
Chloride: 106 meq/L (ref 96–112)
Creatinine, Ser: 0.9 mg/dL (ref 0.4–1.5)
GFR: 92.76 mL/min
Glucose, Bld: 113 mg/dL — ABNORMAL HIGH (ref 70–99)
Potassium: 4.9 meq/L (ref 3.5–5.1)
Sodium: 143 meq/L (ref 135–145)

## 2010-03-05 LAB — CBC WITH DIFFERENTIAL/PLATELET
Basophils Absolute: 0 10*3/uL (ref 0.0–0.1)
Eosinophils Absolute: 0.2 10*3/uL (ref 0.0–0.7)
HCT: 43.3 % (ref 39.0–52.0)
Hemoglobin: 14.7 g/dL (ref 13.0–17.0)
Lymphs Abs: 1.6 10*3/uL (ref 0.7–4.0)
MCHC: 33.9 g/dL (ref 30.0–36.0)
Monocytes Absolute: 0.6 10*3/uL (ref 0.1–1.0)
Neutro Abs: 4.5 10*3/uL (ref 1.4–7.7)
RDW: 14.2 % (ref 11.5–14.6)

## 2010-03-05 LAB — HEPATIC FUNCTION PANEL
ALT: 19 U/L (ref 0–53)
AST: 20 U/L (ref 0–37)
Alkaline Phosphatase: 68 U/L (ref 39–117)
Bilirubin, Direct: 0.1 mg/dL (ref 0.0–0.3)
Total Protein: 6.9 g/dL (ref 6.0–8.3)

## 2010-03-05 LAB — LIPID PANEL
Cholesterol: 203 mg/dL — ABNORMAL HIGH (ref 0–200)
HDL: 46.4 mg/dL
Total CHOL/HDL Ratio: 4
Triglycerides: 128 mg/dL (ref 0.0–149.0)
VLDL: 25.6 mg/dL (ref 0.0–40.0)

## 2010-03-05 LAB — LDL CHOLESTEROL, DIRECT: Direct LDL: 143.7 mg/dL

## 2010-03-05 NOTE — Assessment & Plan Note (Signed)
Patient  With history of gastroesophageal reflux disease. Well controlled on current proton pump inhibitor. We'll check CBC.

## 2010-03-05 NOTE — Assessment & Plan Note (Signed)
Reviewed flow sheet of lipid panels. Needs followup lipid and liver panel. We'll draw that today. I'll call him a couple of days.

## 2010-03-05 NOTE — Progress Notes (Signed)
  Subjective:    Patient ID: Matthew [name], male    DOB: 1935/04/15, 75 y.o.   MRN: [id number]  HPI  patient is here for followup. He has a history of adenocarcinoma of the prostate. He is followed at [facility].    patient has hyperlipidemia. Needs followup. He is tolerating medications without difficulty.  Patient has gastroesophageal reflux disease. He is doing well on current proton pump inhibitor. No GI blood loss.   Review of Systems  patient denies any chest pain, shortness of breath, PND, abdominal pain, change in appetite. No other complaints on complete review of systems. He remains active with walking daily.    Objective:   Physical Exam Well-developed male in no acute distress. HEENT exam atraumatic, normocephalic, extraocular muscles are intact. Conjunctivae are pink without exudate. Neck is supple without thyromegaly, jugular venous distention. Chest is clear to auscultation without increased work of breathing. Cardiac exam S1-S2 are regular. The PMI is normal. No significant murmurs or gallops. Abdominal exam active bowel sounds, soft, nontender. No abdominal bruits. Extremities no  edema. Peripheral pulses are normal without bruits. Neurologic exam alert and oriented without any motor or sensory deficits.      Assessment & Plan:

## 2010-03-05 NOTE — Assessment & Plan Note (Signed)
He has regular followup with [facility]. He sees Dr. Earlene Plater every 6 months.

## 2010-03-06 NOTE — Assessment & Plan Note (Addendum)
Summary: tb reading//ccm   Nurse Visit   Allergies: 1)  ! Penicillin 2)  ! Erythromycin 3)  ! Sulfa 4)  ! Levaquin 5)  ! Tetracycline Hcl (Tetracycline Hcl) 6)  ! * Mycins  PPD Results    Date of reading: 02/26/2010    Results: < 5mm    Interpretation: negative

## 2010-03-06 NOTE — Letter (Signed)
Summary: [facility]  [facility]   Imported By: Maryln Gottron 01/29/2010 10:35:53  _____________________________________________________________________  External Attachment:    Type:   Image     Comment:   External Document

## 2010-03-06 NOTE — Assessment & Plan Note (Addendum)
Summary: tb skin test//ccm   Nurse Visit   Allergies: 1)  ! Penicillin 2)  ! Erythromycin 3)  ! Sulfa 4)  ! Levaquin 5)  ! Tetracycline Hcl (Tetracycline Hcl) 6)  ! * Mycins   Appended Document: tb skin test//ccm     Nurse Visit   Allergies: 1)  ! Penicillin 2)  ! Erythromycin 3)  ! Sulfa 4)  ! Levaquin 5)  ! Tetracycline Hcl (Tetracycline Hcl) 6)  ! * Mycins  Immunizations Administered:  PPD Skin Test:    Vaccine Type: PPD    Site: left forearm    Mfr: Sanofi Pasteur    Dose: 0.1 ml    Route: ID    Given by: Alfred Levins, CMA    Exp. Date: 10/04/2011    Lot #: [id number]  Orders Added: 1)  TB Skin Test [86580] 2)  Admin 1st Vaccine [phone]

## 2010-03-12 NOTE — Miscellaneous (Signed)
Summary: TB Skin Test  TB Skin Test   Imported By: Maryln Gottron 03/05/2010 11:03:04  _____________________________________________________________________  External Attachment:    Type:   Image     Comment:   External Document

## 2010-06-17 NOTE — Discharge Summary (Signed)
NAMECOREE, BRAME                ACCOUNT NO.:  [id number]   MEDICAL RECORD NO.:  [id number]          PATIENT TYPE:  INP   LOCATION:  1432                         FACILITY:  [facility]   PHYSICIAN:  Ronald L. Earlene Plater, M.D.  DATE OF BIRTH:  Jun 12, 1935   DATE OF ADMISSION:  12/07/2007  DATE OF DISCHARGE:  12/08/2007                               DISCHARGE SUMMARY   ADMISSION DIAGNOSIS:  Adenocarcinoma of prostate.   DISCHARGE DIAGNOSIS:  Adenocarcinoma of prostate.   PROCEDURES:  1. Robotic-assisted laparoscopic radical prostatectomy, bilateral      nerve sparing.  2. Bilateral pelvic lymphadenectomy.   HISTORY AND PHYSICAL:  For full details, please see admission history  and physical.  Briefly, Mr. Matthew [name] is a 75 year old white male who  was found to have clinically localized adenocarcinoma of the prostate.  After careful consideration regarding management options for treatment,  he elected to proceed with surgical therapy and robotic-assisted  laparoscopic radical prostatectomy.   HOSPITAL COURSE:  On December 07, 2007, he was taken to the operating  room and underwent a robotic-assisted laparoscopic radical prostatectomy  which he tolerated well and without complications.  Postoperatively, he  was able to be transferred to a regular hospital room following recovery  from anesthesia.  He was able to begin ambulation that evening.  He  remained hemodynamically stable.  His postoperative hematocrit was 41.5.  On the morning of postoperative day one, his hematocrit was also found  to be stable at 36.8.  He maintained excellent urine output.  He was  placed on a clear liquid diet and continued to ambulate.  He was re-  evaluated on the afternoon of postoperative day one.  His urine output,  blood pressure and pulse all remained stable.  His pelvic drain was  removed.  He was also able to tolerate his clear liquid diet without  nausea or vomiting.  Therefore, he was felt to be stable  for discharge  and had met all discharge criteria.   DISPOSITION:  Home.   DISCHARGE MEDICATIONS:  He was instructed to resume his home medications  consisting of simvastatin and potassium chloride.  He was instructed to  hold his multivitamins and over-the-counter herbal supplements for 10  days.  In addition, he was provided a prescription for Vicodin to take  as needed for pain and was told to use Colace as a stool softener.  He  was also provided a prescription for Cipro.   DISCHARGE INSTRUCTIONS:  He was instructed to be ambulatory but was  specifically told to refrain from heavy lifting, strenuous activity or  driving.  He was given instructions on routine Foley catheter care.   FOLLOWUP:  He will follow up in one week for removal of skin staples and  Foley catheter.      Delia Chimes, NP      Lucrezia Starch. Earlene Plater, M.D.  Electronically Signed    MA/MEDQ  D:  12/08/2007  T:  12/09/2007  Job:  [id number]

## 2010-06-17 NOTE — Op Note (Signed)
Matthew [name], Matthew [name]                ACCOUNT NO.:  [id number]   MEDICAL RECORD NO.:  [id number]          PATIENT TYPE:  INP   LOCATION:  1432                         FACILITY:  [facility]   PHYSICIAN:  Ronald L. Earlene Plater, M.D.  DATE OF BIRTH:  11/20/1935   DATE OF PROCEDURE:  12/07/2007  DATE OF DISCHARGE:                               OPERATIVE REPORT   DIAGNOSIS:  Adenocarcinoma of the prostate.   OPERATIVE PROCEDURE:  Robotic assisted laparoscopic radical  prostatectomy with bilateral pelvic lymphadenectomy.   SURGEON:  Dr. Gaynelle Arabian.   ASSISTANT:  Delia Chimes, NPC.   ANESTHESIA:  General endotracheal.   ESTIMATED BLOOD LOSS:  250 mL.   TUBES:  A 20-French coude catheter, a large round Blake drain.   COMPLICATIONS:  None.   INDICATIONS FOR PROCEDURE:  Matthew [name] is a very nice 75 year old white  male who was found have an elevated PSA of 5.8.  He subsequently  underwent ultrasound biopsy of the prostate which revealed a Gleason  score 7, which was 3+4 adenocarcinoma and 10% of the biopsy from the  right lateral base, from 30% of the biopsy from the right mid prostate,  from 30% of the biopsy from the right lateral apex and from 40% of the  biopsy from left lateral apex mid.  His metastatic workup was negative.  After understanding risks, benefits and alternatives, he has elected to  proceed with the above procedure.   PROCEDURE IN DETAIL:  The patient was placed in a supine position.  After proper general endotracheal anesthesia, he was placed in the  dorsal lithotomy position, exaggerated lithotomy and prepped and draped  with Betadine in a sterile fashion.  A 24-French 30 mL balloon Foley  catheter was placed and the bladder was drained.  A periumbilical 12-mm  port was placed under direct vision with an incision, and the abdomen  was insufflated with carbon dioxide.  Inspection revealed there were no  significant abnormalities.  Under direct vision, the right and left  robotic arm ports were placed with 8 mm ports.  After it had been  localized with the needle, a fourth arm port was placed to the left and  5 mm and 12 mm working ports were placed on the right side of the  abdomen in appropriate position.  Dissection was begun.  The median and  medial umbilical ligaments were taken down sharply.  The space of  Retzius was entered and dissection was begun utilizing both ERBE bipolar  and hot shears on the right side.  The endopelvic fascia was taken down  bilaterally.  The dorsal vein complex was clipped with an endovascular  stapler and the prostate was mobilized.  It was noted that there were  two large lateral lobes.  The bladder neck was taken from the prostate  carefully and significant bilobar hypertrophy was noted and was taken  down posteriorly.  A 30 degree down lens was used at this point.  The  seminal vesicles were then dissected free.  Good hemostasis was  obtained.  The ampulla of the vas deferens were incised bilaterally  and  taken down posteriorly.  Denonvilliers fascia was then taken down to the  apex of prostate.  A bilateral nerve spare was performed well away from  the prostate, however.  The vascular pedicles were taken in serial  packets, clipped with Hem-o-lok clips and dissection was carried down to  the apex of the prostate.  The urethra was then sharply incised.  The  prostate and structures were removed and placed in the right lower  quadrant.  A bilateral lymphadenectomy was then performed.  Both the  obturator and external iliac lymph nodes were taken down.  The obturator  nerve was identified and protected bilaterally.  All lymphatics were  clipped with Hem-o-lok clips.  Next, the anastomosis was approached.  The bladder neck was somewhat widened, therefore a figure-of-eight 4-0  Vicryl suture was placed in the 12 o'clock position to tighten it  slightly.  Indigo carmine had been given IV, and there was good dye  color urine  exiting from the bladder.  A holding stitch of 2-0 Vicryl  was placed in the 6 o'clock position and the urethra was approximated to  the bladder neck.  The anastomosis was then performed with a 3-0  Monocryl suture with dyed and undyed suture.  The suture tied  posteriorly onto the bladder neck.  A running anastomosis was performed  and tied anteriorly.  A 20 French coude catheter was passed and inflated  with sterile water, and the bladder was noted to irrigate clear.  Good  hemostasis was noted to be present.  A large round Blake drain was  placed through the fourth arm port.  Reinspection of the abdomen  revealed there was good hemostasis and no significant abnormalities.  The specimen was captured with an EndoCatch bag through the camera port.  The 12 mm right working port was closed under direct vision with a  suture passer and 2-0 Vicryl suture.  All ports were removed under  direct vision and there was good hemostasis noted.  The specimen was  then removed from the periumbilical port in the fascia.  This incision  was closed with a running 2-0 Vicryl suture.  All wounds were irrigated  and injected with quarter-percent Marcaine and closed with skin staples.  The large round Blake drain was sutured in place with nylon.  The wound  was dressed sterilely.  The bladder was then irrigated and noted to be  clear with blue dye.  The patient tolerated the procedure well with no  complications and taken to the recovery room second.      Ronald L. Earlene Plater, M.D.  Electronically Signed     RLD/MEDQ  D:  12/07/2007  T:  12/07/2007  Job:  [id number]

## 2010-09-02 ENCOUNTER — Encounter: Payer: Self-pay | Admitting: Internal Medicine

## 2010-09-05 ENCOUNTER — Ambulatory Visit (INDEPENDENT_AMBULATORY_CARE_PROVIDER_SITE_OTHER): Payer: Medicare Other | Admitting: Internal Medicine

## 2010-09-05 ENCOUNTER — Encounter: Payer: Self-pay | Admitting: Internal Medicine

## 2010-09-05 VITALS — BP 144/90 | HR 72 | Temp 98.4°F | Ht 68.0 in | Wt 195.0 lb

## 2010-09-05 DIAGNOSIS — M549 Dorsalgia, unspecified: Secondary | ICD-10-CM

## 2010-09-05 DIAGNOSIS — C61 Malignant neoplasm of prostate: Secondary | ICD-10-CM

## 2010-09-05 LAB — POCT URINALYSIS DIPSTICK
Blood, UA: NEGATIVE
Ketones, UA: NEGATIVE
Protein, UA: NEGATIVE
Spec Grav, UA: 1.015
pH, UA: 7

## 2010-09-05 NOTE — Assessment & Plan Note (Signed)
Suspect mechanical but will check for nephrolithiasis (UA)  Will refer to [facility] for PT

## 2010-09-05 NOTE — Progress Notes (Signed)
  Subjective:    Patient ID: Matthew [name], male    DOB: 11-04-1935, 75 y.o.   MRN: [id number]  HPI Main complaint is left sided mid back/flank pain. No hematuria. He states that pain happens every a.m. About 430 a.m. No radiation of pain. No rash.   GERD---sxs controlled  Prostate CA---followed by urology.   Past Medical History  Diagnosis Date  . ADENOCARCINOMA, PROSTATE 10/26/2007  . HYPERLIPIDEMIA 11/09/2006  . GERD 04/25/2007  . COLONIC POLYPS, HX OF 08/26/2006   Past Surgical History  Procedure Date  . Tonsillectomy and adenoidectomy   . Prostatectomy 2009    reports that he has quit smoking. His smoking use included Pipe. He does not have any smokeless tobacco history on file. His alcohol and drug histories not on file. family history includes Dementia in his mother. Allergies  Allergen Reactions  . Erythromycin     REACTION: hives  . Levofloxacin     REACTION: joint swelling  . Penicillins   . Sulfonamide Derivatives     REACTION: swelling  . Tetracycline Hcl     REACTION: unspecified      Review of Systems  patient denies chest pain, shortness of breath, orthopnea. Denies lower extremity edema, abdominal pain, change in appetite, change in bowel movements. Patient denies rashes, musculoskeletal complaints. No other specific complaints in a complete review of systems.      Objective:   Physical Exam  well-developed well-nourished male in no acute distress. HEENT exam atraumatic, normocephalic, neck supple without jugular venous distention. Chest clear to auscultation cardiac exam S1-S2 are regular. Abdominal exam overweight with bowel sounds, soft and nontender. Extremities no edema. Neurologic exam is alert with a normal gait.        Assessment & Plan:

## 2010-09-16 ENCOUNTER — Telehealth: Payer: Self-pay | Admitting: *Deleted

## 2010-09-16 NOTE — Telephone Encounter (Signed)
Pt is asking for his Urine results, and wants to know today.  Says it has been 10 days and he is worried.

## 2010-09-16 NOTE — Telephone Encounter (Signed)
LMTCB

## 2010-09-17 NOTE — Telephone Encounter (Signed)
Pt aware.

## 2010-09-18 NOTE — Assessment & Plan Note (Signed)
Patient has regular followup with urology.

## 2010-11-04 LAB — URINE CULTURE: Colony Count: NO GROWTH

## 2010-11-04 LAB — COMPREHENSIVE METABOLIC PANEL
ALT: 19
Albumin: 4.1
Alkaline Phosphatase: 70
GFR calc Af Amer: >60
Potassium: 3.9
Sodium: 137
Total Protein: 7

## 2010-11-04 LAB — CBC
HCT: 36.8 — ABNORMAL LOW
Hemoglobin: 12.4 — ABNORMAL LOW
Hemoglobin: 14
MCV: 90.9
Platelets: 173
Platelets: 195
RBC: 4.05 — ABNORMAL LOW
RDW: 12.9
RDW: 13.1
WBC: 10.7 — ABNORMAL HIGH

## 2010-11-04 LAB — URINALYSIS, ROUTINE W REFLEX MICROSCOPIC
Bilirubin Urine: NEGATIVE
Glucose, UA: 100 — AB
Glucose, UA: NEGATIVE
Hgb urine dipstick: NEGATIVE
Ketones, ur: 40 — AB
Ketones, ur: NEGATIVE
Protein, ur: 100 — AB
Specific Gravity, Urine: 1.01
pH: 5
pH: 6

## 2010-11-04 LAB — DIFFERENTIAL
Basophils Absolute: 0
Eosinophils Absolute: 0.1
Eosinophils Relative: 1
Lymphocytes Relative: 7 — ABNORMAL LOW
Lymphocytes Relative: 9 — ABNORMAL LOW
Lymphs Abs: 0.9
Monocytes Absolute: 0.4
Monocytes Relative: 9
Neutro Abs: 11.7 — ABNORMAL HIGH
Neutrophils Relative %: 89 — ABNORMAL HIGH

## 2010-11-04 LAB — BASIC METABOLIC PANEL
Calcium: 8.3 — ABNORMAL LOW
Chloride: 102
GFR calc Af Amer: >60
GFR calc non Af Amer: >60
Glucose, Bld: 144 — ABNORMAL HIGH
Potassium: 3.5
Sodium: 133 — ABNORMAL LOW
Sodium: 134 — ABNORMAL LOW

## 2010-11-04 LAB — URINE MICROSCOPIC-ADD ON

## 2010-11-04 LAB — TYPE AND SCREEN: Antibody Screen: NEGATIVE

## 2010-11-04 LAB — PROTIME-INR: INR: 0.9

## 2010-11-07 ENCOUNTER — Telehealth: Payer: Self-pay | Admitting: *Deleted

## 2010-11-07 NOTE — Telephone Encounter (Signed)
Pt is wanting to know if he and his wife can Sensa.

## 2010-11-09 NOTE — Telephone Encounter (Signed)
I would not recommend it.

## 2010-11-10 NOTE — Telephone Encounter (Signed)
Notified pt. 

## 2011-02-25 ENCOUNTER — Ambulatory Visit (INDEPENDENT_AMBULATORY_CARE_PROVIDER_SITE_OTHER): Payer: Medicare Other | Admitting: Internal Medicine

## 2011-02-25 DIAGNOSIS — Z111 Encounter for screening for respiratory tuberculosis: Secondary | ICD-10-CM | POA: Diagnosis not present

## 2011-03-09 ENCOUNTER — Telehealth: Payer: Self-pay | Admitting: Internal Medicine

## 2011-03-09 MED ORDER — SIMVASTATIN 40 MG PO TABS: 40.0000 mg | ORAL_TABLET | Freq: Every day | ORAL | Status: DC

## 2011-03-09 NOTE — Telephone Encounter (Signed)
rx sent in electronically 

## 2011-03-09 NOTE — Telephone Encounter (Signed)
Refill Simvastatin 40mg  1qd to [facility].

## 2011-05-11 ENCOUNTER — Encounter: Payer: Self-pay | Admitting: Internal Medicine

## 2011-05-11 ENCOUNTER — Ambulatory Visit (INDEPENDENT_AMBULATORY_CARE_PROVIDER_SITE_OTHER): Payer: Medicare Other | Admitting: Internal Medicine

## 2011-05-11 VITALS — BP 148/90 | HR 80 | Temp 98.4°F | Ht 68.0 in | Wt 200.0 lb

## 2011-05-11 DIAGNOSIS — R7309 Other abnormal glucose: Secondary | ICD-10-CM

## 2011-05-11 DIAGNOSIS — I10 Essential (primary) hypertension: Secondary | ICD-10-CM | POA: Diagnosis not present

## 2011-05-11 DIAGNOSIS — E785 Hyperlipidemia, unspecified: Secondary | ICD-10-CM | POA: Diagnosis not present

## 2011-05-11 DIAGNOSIS — R739 Hyperglycemia, unspecified: Secondary | ICD-10-CM | POA: Insufficient documentation

## 2011-05-11 DIAGNOSIS — K219 Gastro-esophageal reflux disease without esophagitis: Secondary | ICD-10-CM

## 2011-05-11 LAB — CBC WITH DIFFERENTIAL/PLATELET
Basophils Absolute: 0 10*3/uL (ref 0.0–0.1)
Basophils Relative: 0.5 % (ref 0.0–3.0)
Eosinophils Relative: 2.7 % (ref 0.0–5.0)
HCT: 43.5 % (ref 39.0–52.0)
Hemoglobin: 14.7 g/dL (ref 13.0–17.0)
Lymphocytes Relative: 17.1 % (ref 12.0–46.0)
Lymphs Abs: 1.2 10*3/uL (ref 0.7–4.0)
Monocytes Relative: 7.9 % (ref 3.0–12.0)
Neutro Abs: 4.9 10*3/uL (ref 1.4–7.7)
RBC: 4.9 Mil/uL (ref 4.22–5.81)
RDW: 13.8 % (ref 11.5–14.6)
WBC: 6.9 10*3/uL (ref 4.5–10.5)

## 2011-05-11 LAB — HEPATIC FUNCTION PANEL
ALT: 25 U/L (ref 0–53)
AST: 21 U/L (ref 0–37)
Bilirubin, Direct: 0 mg/dL (ref 0.0–0.3)
Total Bilirubin: 0.3 mg/dL (ref 0.3–1.2)
Total Protein: 7 g/dL (ref 6.0–8.3)

## 2011-05-11 LAB — LIPID PANEL
LDL Cholesterol: 132 mg/dL — ABNORMAL HIGH (ref 0–99)
Total CHOL/HDL Ratio: 4
Triglycerides: 110 mg/dL (ref 0.0–149.0)

## 2011-05-11 LAB — BASIC METABOLIC PANEL
BUN: 14 mg/dL (ref 6–23)
CO2: 25 mEq/L (ref 19–32)
Chloride: 105 mEq/L (ref 96–112)
Creatinine, Ser: 0.9 mg/dL (ref 0.4–1.5)
Glucose, Bld: 111 mg/dL — ABNORMAL HIGH (ref 70–99)
Potassium: 3.9 mEq/L (ref 3.5–5.1)

## 2011-05-11 MED ORDER — LISINOPRIL 20 MG PO TABS: 20.0000 mg | ORAL_TABLET | Freq: Every day | ORAL | Status: DC

## 2011-05-11 MED ORDER — OMEPRAZOLE 20 MG PO CPDR: 20.0000 mg | DELAYED_RELEASE_CAPSULE | Freq: Two times a day (BID) | ORAL | Status: DC

## 2011-05-11 NOTE — Assessment & Plan Note (Signed)
Check labs today.

## 2011-05-11 NOTE — Assessment & Plan Note (Signed)
i have reviewed previous BPs the majority of which are elevated.  Start lisinopril 20 mg po qd.

## 2011-05-11 NOTE — Progress Notes (Signed)
Patient ID: Matthew [name], male   DOB: February 26, 1935, 76 y.o.   MRN: [id number]  HTN--currently not on any medications  Lipids- needs f/u  Hyperglycemia---needs f/u   Past Medical History  Diagnosis Date  . ADENOCARCINOMA, PROSTATE 10/26/2007  . HYPERLIPIDEMIA 11/09/2006  . GERD 04/25/2007  . COLONIC POLYPS, HX OF 08/26/2006    History   Social History  . Marital Status: Married    Spouse Name: N/A    Number of Children: N/A  . Years of Education: N/A   Occupational History  . Not on file.   Social History Main Topics  . Smoking status: Former Smoker    Types: Pipe  . Smokeless tobacco: Not on file  . Alcohol Use: Not on file  . Drug Use: Not on file  . Sexually Active: Not on file   Other Topics Concern  . 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 Tonsillectomy and adenoidectomy   . Prostatectomy 2009    Family History  Problem Relation Age of Onset  . Dementia Mother     Allergies  Allergen Reactions  . Erythromycin     REACTION: hives  . Levofloxacin     REACTION: joint swelling  . Penicillins   . Sulfonamide Derivatives     REACTION: swelling  . Tetracycline Hcl     REACTION: unspecified    Current Outpatient Prescriptions on File Prior to Visit  Medication Sig Dispense Refill  . Potassium Gluconate 550 MG TABS Take 1 tablet by mouth daily.      . Ascorbic Acid (VITAMIN C) 500 MG tablet Take 500 mg by mouth daily.        . multivitamin (THERAGRAN) per tablet Take 1 tablet by mouth daily.        Marland Kitchen omeprazole (PRILOSEC) 20 MG capsule Take 20 mg by mouth daily.        . simvastatin (ZOCOR) 40 MG tablet Take 1 tablet (40 mg total) by mouth at bedtime.  90 tablet  3     patient denies chest pain, shortness of breath, orthopnea. Denies lower extremity edema, abdominal pain, change in appetite, change in bowel movements. Patient denies rashes,  musculoskeletal complaints. No other specific complaints in a complete review of systems.   BP 148/90  Pulse 80  Temp(Src) 98.4 F (36.9 C) (Oral)  Ht 5\' 8"  (1.727 m)  Wt 200 lb (90.719 kg)  BMI 30.41 kg/m2  Well-developed well-nourished male in no acute distress. HEENT exam atraumatic, normocephalic, extraocular muscles are intact. Neck is supple. No jugular venous distention no thyromegaly. Chest clear to auscultation without increased work of breathing. Cardiac exam S1 and S2 are regular. Abdominal exam active bowel sounds, soft, nontender. Extremities no edema. Neurologic exam she is alert without any motor sensory deficits. Gait is normal.

## 2011-05-11 NOTE — Assessment & Plan Note (Addendum)
Not as well controlled Increase omeprazole to bid

## 2011-05-11 NOTE — Assessment & Plan Note (Signed)
Needs f/u labs 

## 2011-05-18 ENCOUNTER — Telehealth: Payer: Self-pay | Admitting: *Deleted

## 2011-05-18 DIAGNOSIS — H2510 Age-related nuclear cataract, unspecified eye: Secondary | ICD-10-CM | POA: Diagnosis not present

## 2011-05-18 NOTE — Telephone Encounter (Signed)
Pt aware.

## 2011-05-18 NOTE — Telephone Encounter (Signed)
Increase lisinopril to 40 mg once daily. Monitor blood pressure at home and have him call and report blood pressures in one week.

## 2011-05-18 NOTE — Telephone Encounter (Signed)
Pt's blood pressure this weekend is 172/73, 184/91, 196/97.  Pt is taking lisinopril 20 mg once daily

## 2011-05-25 ENCOUNTER — Telehealth: Payer: Self-pay | Admitting: *Deleted

## 2011-05-25 MED ORDER — AMLODIPINE BESYLATE 5 MG PO TABS: 5.0000 mg | ORAL_TABLET | Freq: Every day | ORAL | Status: DC

## 2011-05-25 MED ORDER — LISINOPRIL 40 MG PO TABS: 40.0000 mg | ORAL_TABLET | Freq: Every day | ORAL | Status: DC

## 2011-05-25 NOTE — Telephone Encounter (Signed)
Dr Cato Mulligan, I updated the med list but the Lisinopril 20 mg was already increased to 40 mg last Monday.  Do you want to just add the amlodipine do you want to change the Lisinopril

## 2011-05-26 MED ORDER — AMLODIPINE BESYLATE 5 MG PO TABS: 5.0000 mg | ORAL_TABLET | Freq: Every day | ORAL | Status: DC

## 2011-05-26 NOTE — Telephone Encounter (Signed)
Per Dr Cato Mulligan, just add amlodipine 5 mg, pt aware, rx sent in to [facility], pt aware

## 2011-06-01 ENCOUNTER — Telehealth: Payer: Self-pay | Admitting: *Deleted

## 2011-06-01 NOTE — Telephone Encounter (Signed)
Patient is calling because his blood pressure reading have been elevated.  He is taking Lisinopril 40 and Amlodipine 5 daily.  His reading have been 179/91, 195/101, and 182/94.  Patient requested to speak with Dr Cato Mulligan at his wife's appointment at 8 am tomorrow.  I have scheduled him at 9:30.  Patient is concerned because his wife has an appointment at 10 am in [place].  Suggestions?

## 2011-06-02 NOTE — Telephone Encounter (Signed)
appt scheduled for tomorrow 06/03/11

## 2011-06-02 NOTE — Telephone Encounter (Signed)
Do a nurse bp check, compare his cuff to our reading

## 2011-06-03 ENCOUNTER — Ambulatory Visit (INDEPENDENT_AMBULATORY_CARE_PROVIDER_SITE_OTHER): Payer: Medicare Other | Admitting: Internal Medicine

## 2011-06-03 ENCOUNTER — Encounter: Payer: Self-pay | Admitting: Internal Medicine

## 2011-06-03 VITALS — BP 135/75 | HR 76 | Temp 98.3°F | Wt 197.0 lb

## 2011-06-03 DIAGNOSIS — I10 Essential (primary) hypertension: Secondary | ICD-10-CM | POA: Diagnosis not present

## 2011-06-03 NOTE — Progress Notes (Signed)
Patient ID: Matthew [name], male   DOB: September 28, 1935, 76 y.o.   MRN: [id number] htn-- home bps 160-180/90s Tolerating meds without difficulty.  Past Medical History  Diagnosis Date  . ADENOCARCINOMA, PROSTATE 10/26/2007  . HYPERLIPIDEMIA 11/09/2006  . GERD 04/25/2007  . COLONIC POLYPS, HX OF 08/26/2006    History   Social History  . Marital Status: Married    Spouse Name: N/A    Number of Children: N/A  . Years of Education: N/A   Occupational History  . Not on file.   Social History Main Topics  . Smoking status: Former Smoker    Types: Pipe  . Smokeless tobacco: Not on file  . Alcohol Use: Not on file  . Drug Use: Not on file  . Sexually Active: Not on file   Other Topics Concern  . 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 Tonsillectomy and adenoidectomy   . Prostatectomy 2009    Family History  Problem Relation Age of Onset  . Dementia Mother     Allergies  Allergen Reactions  . Erythromycin     REACTION: hives  . Levofloxacin     REACTION: joint swelling  . Penicillins   . Sulfonamide Derivatives     REACTION: swelling  . Tetracycline Hcl     REACTION: unspecified    Current Outpatient Prescriptions on File Prior to Visit  Medication Sig Dispense Refill  . amLODipine (NORVASC) 5 MG tablet Take 1 tablet (5 mg total) by mouth daily.  90 tablet  1  . lisinopril (PRINIVIL,ZESTRIL) 40 MG tablet Take 1 tablet (40 mg total) by mouth daily.  30 tablet  0  . omeprazole (PRILOSEC) 20 MG capsule Take 1 capsule (20 mg total) by mouth 2 (two) times daily.  180 capsule  3  . simvastatin (ZOCOR) 40 MG tablet Take 1 tablet (40 mg total) by mouth at bedtime.  90 tablet  3  . Ascorbic Acid (VITAMIN C) 500 MG tablet Take 500 mg by mouth daily.        . multivitamin (THERAGRAN) per tablet Take 1 tablet by mouth daily.           patient denies chest pain, shortness  of breath, orthopnea. Denies lower extremity edema, abdominal pain, change in appetite, change in bowel movements. Patient denies rashes, musculoskeletal complaints. No other specific complaints in a complete review of systems.   BP 146/88  Pulse 76  Temp(Src) 98.3 F (36.8 C) (Oral)  Wt 197 lb (89.359 kg)  well-developed well-nourished male in no acute distress. HEENT exam atraumatic, normocephalic, neck supple without jugular venous distention. Chest clear to auscultation cardiac exam S1-S2 are regular. Abdominal exam overweight with bowel sounds, soft and nontender.

## 2011-06-03 NOTE — Assessment & Plan Note (Signed)
Repeat bp is ok and very different than home BPS i will have him come back in the next two weeks and repeat bp with his cuff and compare to our cuff.

## 2011-06-10 ENCOUNTER — Ambulatory Visit: Payer: BC Managed Care – PPO | Admitting: Internal Medicine

## 2011-06-17 ENCOUNTER — Ambulatory Visit (INDEPENDENT_AMBULATORY_CARE_PROVIDER_SITE_OTHER): Payer: Medicare Other | Admitting: Internal Medicine

## 2011-06-17 VITALS — BP 167/88 | HR 68

## 2011-06-17 DIAGNOSIS — I10 Essential (primary) hypertension: Secondary | ICD-10-CM

## 2011-06-18 NOTE — Progress Notes (Signed)
  Subjective:    Patient ID: Matthew [name], male    DOB: [birth year], 76 y.o.   MRN: [id number]  HPI    Review of Systems     Objective:   Physical Exam        Assessment & Plan:  bp check only

## 2011-06-30 ENCOUNTER — Other Ambulatory Visit: Payer: Self-pay | Admitting: *Deleted

## 2011-06-30 DIAGNOSIS — K219 Gastro-esophageal reflux disease without esophagitis: Secondary | ICD-10-CM

## 2011-06-30 MED ORDER — OMEPRAZOLE 20 MG PO CPDR: 20.0000 mg | DELAYED_RELEASE_CAPSULE | Freq: Two times a day (BID) | ORAL | Status: DC

## 2011-06-30 MED ORDER — LISINOPRIL 40 MG PO TABS: 40.0000 mg | ORAL_TABLET | Freq: Every day | ORAL | Status: DC

## 2011-06-30 MED ORDER — AMLODIPINE BESYLATE 5 MG PO TABS: 5.0000 mg | ORAL_TABLET | Freq: Every day | ORAL | Status: DC

## 2011-07-24 DIAGNOSIS — C61 Malignant neoplasm of prostate: Secondary | ICD-10-CM | POA: Diagnosis not present

## 2011-07-28 ENCOUNTER — Other Ambulatory Visit: Payer: Self-pay | Admitting: Dermatology

## 2011-07-28 DIAGNOSIS — D0470 Carcinoma in situ of skin of unspecified lower limb, including hip: Secondary | ICD-10-CM | POA: Diagnosis not present

## 2011-07-28 DIAGNOSIS — D045 Carcinoma in situ of skin of trunk: Secondary | ICD-10-CM | POA: Diagnosis not present

## 2011-07-28 DIAGNOSIS — L219 Seborrheic dermatitis, unspecified: Secondary | ICD-10-CM | POA: Diagnosis not present

## 2011-07-28 DIAGNOSIS — D0460 Carcinoma in situ of skin of unspecified upper limb, including shoulder: Secondary | ICD-10-CM | POA: Diagnosis not present

## 2011-08-14 ENCOUNTER — Telehealth: Payer: Self-pay | Admitting: *Deleted

## 2011-08-14 ENCOUNTER — Encounter: Payer: Self-pay | Admitting: Internal Medicine

## 2011-08-14 ENCOUNTER — Ambulatory Visit (INDEPENDENT_AMBULATORY_CARE_PROVIDER_SITE_OTHER): Payer: Medicare Other | Admitting: Internal Medicine

## 2011-08-14 VITALS — BP 130/80 | HR 68 | Temp 98.3°F | Wt 200.0 lb

## 2011-08-14 DIAGNOSIS — E785 Hyperlipidemia, unspecified: Secondary | ICD-10-CM | POA: Diagnosis not present

## 2011-08-14 DIAGNOSIS — I10 Essential (primary) hypertension: Secondary | ICD-10-CM | POA: Diagnosis not present

## 2011-08-14 DIAGNOSIS — R7309 Other abnormal glucose: Secondary | ICD-10-CM | POA: Diagnosis not present

## 2011-08-14 DIAGNOSIS — R739 Hyperglycemia, unspecified: Secondary | ICD-10-CM

## 2011-08-14 MED ORDER — AMLODIPINE BESYLATE 10 MG PO TABS: 10.0000 mg | ORAL_TABLET | Freq: Every day | ORAL | Status: DC

## 2011-08-14 NOTE — Assessment & Plan Note (Signed)
Not controlled Increase amlodipine Will switch statins

## 2011-08-14 NOTE — Progress Notes (Signed)
Patient ID: Matthew [name], male   DOB: 11/25/1935, 76 y.o.   MRN: [id number] htn- home BPs 140-163/71-90  Lipids- tolerating meds  GERD-- no sxs on PPI  Past Medical History  Diagnosis Date  . ADENOCARCINOMA, PROSTATE 10/26/2007  . HYPERLIPIDEMIA 11/09/2006  . GERD 04/25/2007  . COLONIC POLYPS, HX OF 08/26/2006    History   Social History  . Marital Status: Married    Spouse Name: N/A    Number of Children: N/A  . Years of Education: N/A   Occupational History  . Not on file.   Social History Main Topics  . Smoking status: Former Smoker    Types: Pipe  . Smokeless tobacco: Not on file  . Alcohol Use: Not on file  . Drug Use: Not on file  . Sexually Active: Not on file   Other Topics Concern  . 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 Tonsillectomy and adenoidectomy   . Prostatectomy 2009    Family History  Problem Relation Age of Onset  . Dementia Mother     Allergies  Allergen Reactions  . Erythromycin     REACTION: hives  . Levofloxacin     REACTION: joint swelling  . Penicillins   . Sulfonamide Derivatives     REACTION: swelling  . Tetracycline Hcl     REACTION: unspecified    Current Outpatient Prescriptions on File Prior to Visit  Medication Sig Dispense Refill  . amLODipine (NORVASC) 5 MG tablet Take 1 tablet (5 mg total) by mouth daily.  90 tablet  3  . Ascorbic Acid (VITAMIN C) 500 MG tablet Take 500 mg by mouth daily.        Marland Kitchen lisinopril (PRINIVIL,ZESTRIL) 40 MG tablet Take 1 tablet (40 mg total) by mouth daily.  90 tablet  3  . multivitamin (THERAGRAN) per tablet Take 1 tablet by mouth daily.        Marland Kitchen omeprazole (PRILOSEC) 20 MG capsule Take 1 capsule (20 mg total) by mouth 2 (two) times daily.  180 capsule  3  . potassium chloride (KLOR-CON) 20 MEQ packet Take 20 mEq by mouth daily.      . simvastatin (ZOCOR) 40 MG tablet Take 1 tablet  (40 mg total) by mouth at bedtime.  90 tablet  3     patient denies chest pain, shortness of breath, orthopnea. Denies lower extremity edema, abdominal pain, change in appetite, change in bowel movements. Patient denies rashes, musculoskeletal complaints. No other specific complaints in a complete review of systems.   BP 130/80  Pulse 68  Temp 98.3 F (36.8 C) (Oral)  Wt 200 lb (90.719 kg)  well-developed well-nourished male in no acute distress. HEENT exam atraumatic, normocephalic, neck supple without jugular venous distention. Chest clear to auscultation cardiac exam S1-S2 are regular. Abdominal exam overweight with bowel sounds, soft and nontender. Extremities no edema. Neurologic exam is alert with a normal gait.

## 2011-08-14 NOTE — Assessment & Plan Note (Signed)
Lab Results  Component Value Date   HGBA1C 6.1 05/11/2011

## 2011-08-14 NOTE — Assessment & Plan Note (Signed)
Given use of amlodipine, i will switch statin to lipitor

## 2011-08-14 NOTE — Telephone Encounter (Signed)
Pt was calling about lipitor not being sent in to pharmacy.  I don't see the mg on the office note.  [facility] mail order and [facility] for 30 days temporary

## 2011-08-16 MED ORDER — ATORVASTATIN CALCIUM 10 MG PO TABS: 10.0000 mg | ORAL_TABLET | Freq: Every day | ORAL | Status: DC

## 2011-08-16 NOTE — Telephone Encounter (Signed)
Sent in meds to mail order, He can wait untiil he gets it from that pharmacy

## 2011-08-17 NOTE — Telephone Encounter (Signed)
Pt aware.

## 2011-08-18 ENCOUNTER — Other Ambulatory Visit: Payer: Self-pay | Admitting: *Deleted

## 2011-08-18 MED ORDER — ATORVASTATIN CALCIUM 10 MG PO TABS: 10.0000 mg | ORAL_TABLET | Freq: Every day | ORAL | Status: DC

## 2011-09-07 DIAGNOSIS — C61 Malignant neoplasm of prostate: Secondary | ICD-10-CM | POA: Diagnosis not present

## 2011-09-14 ENCOUNTER — Ambulatory Visit (INDEPENDENT_AMBULATORY_CARE_PROVIDER_SITE_OTHER): Payer: Medicare Other | Admitting: Internal Medicine

## 2011-09-14 VITALS — BP 142/74

## 2011-09-14 DIAGNOSIS — I10 Essential (primary) hypertension: Secondary | ICD-10-CM | POA: Diagnosis not present

## 2011-09-17 NOTE — Progress Notes (Signed)
Pt comes in for a BP check.  Per Dr Cato Mulligan, BP is ok stay on current meds, pt aware

## 2011-09-30 ENCOUNTER — Telehealth: Payer: Self-pay | Admitting: Internal Medicine

## 2011-09-30 NOTE — Telephone Encounter (Signed)
Patient states he would like to see Dr. Cato Mulligan before the middle of September due to  fu on bp due to swelling and tingling in feet and legs as he is going out of town. Please assist.

## 2011-10-01 NOTE — Telephone Encounter (Signed)
Pt is coming in at 8:30 on 9/4

## 2011-10-07 ENCOUNTER — Encounter: Payer: Self-pay | Admitting: Internal Medicine

## 2011-10-07 ENCOUNTER — Ambulatory Visit (INDEPENDENT_AMBULATORY_CARE_PROVIDER_SITE_OTHER): Payer: Medicare Other | Admitting: Internal Medicine

## 2011-10-07 VITALS — BP 138/82 | HR 84 | Temp 98.0°F | Wt 203.0 lb

## 2011-10-07 DIAGNOSIS — I10 Essential (primary) hypertension: Secondary | ICD-10-CM | POA: Diagnosis not present

## 2011-10-07 MED ORDER — AMLODIPINE BESYLATE 5 MG PO TABS: 5.0000 mg | ORAL_TABLET | Freq: Every day | ORAL | Status: DC

## 2011-10-07 MED ORDER — HYDROCHLOROTHIAZIDE 25 MG PO TABS: 25.0000 mg | ORAL_TABLET | Freq: Every day | ORAL | Status: DC

## 2011-10-07 NOTE — Progress Notes (Signed)
Patient ID: Matthew [name], male   DOB: [birth year], 76 y.o.   MRN: [id number]  Edema of lower extremities- htn- on amlodipine  Past Medical History  Diagnosis Date  . ADENOCARCINOMA, PROSTATE 10/26/2007  . HYPERLIPIDEMIA 11/09/2006  . GERD 04/25/2007  . COLONIC POLYPS, HX OF 08/26/2006    History   Social History  . Marital Status: Married    Spouse Name: N/A    Number of Children: N/A  . Years of Education: N/A   Occupational History  . Not on file.   Social History Main Topics  . Smoking status: Former Smoker    Types: Pipe  . Smokeless tobacco: Not on file  . Alcohol Use: Not on file  . Drug Use: Not on file  . Sexually Active: Not on file   Other Topics Concern  . 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 Tonsillectomy and adenoidectomy   . Prostatectomy 2009    Family History  Problem Relation Age of Onset  . Dementia Mother     Allergies  Allergen Reactions  . Erythromycin     REACTION: hives  . Levofloxacin     REACTION: joint swelling  . Penicillins   . Sulfonamide Derivatives     REACTION: swelling  . Tetracycline Hcl     REACTION: unspecified    Current Outpatient Prescriptions on File Prior to Visit  Medication Sig Dispense Refill  . amLODipine (NORVASC) 10 MG tablet Take 1 tablet (10 mg total) by mouth daily.  90 tablet  3  . Ascorbic Acid (VITAMIN C) 500 MG tablet Take 500 mg by mouth daily.        Marland Kitchen atorvastatin (LIPITOR) 10 MG tablet Take 1 tablet (10 mg total) by mouth daily.  90 tablet  3  . lisinopril (PRINIVIL,ZESTRIL) 40 MG tablet Take 1 tablet (40 mg total) by mouth daily.  90 tablet  3  . multivitamin (THERAGRAN) per tablet Take 1 tablet by mouth daily.        Marland Kitchen omeprazole (PRILOSEC) 20 MG capsule Take 1 capsule (20 mg total) by mouth 2 (two) times daily.  180 capsule  3  . Potassium Gluconate 550 MG TABS Take 1 tablet by mouth  daily.         patient denies chest pain, shortness of breath, orthopnea. Denies lower extremity edema, abdominal pain, change in appetite, change in bowel movements. Patient denies rashes, musculoskeletal complaints. No other specific complaints in a complete review of systems.   BP 138/82  Pulse 84  Temp 98 F (36.7 C) (Oral)  Wt 203 lb (92.08 kg)  well-developed well-nourished male in no acute distress. HEENT exam atraumatic, normocephalic, neck supple without jugular venous distention. Chest clear to auscultation cardiac exam S1-S2 are regular. Abdominal exam overweight with bowel sounds, soft and nontender.extr- 1+ edema.

## 2011-10-07 NOTE — Assessment & Plan Note (Signed)
BP Readings from Last 3 Encounters:  10/07/11 138/82  09/14/11 142/74  08/14/11 130/80   Fair control- on amlodipine, now with edema Decrease amlodipine to 5 mg and add hctz

## 2011-10-29 DIAGNOSIS — Z23 Encounter for immunization: Secondary | ICD-10-CM | POA: Diagnosis not present

## 2011-11-05 ENCOUNTER — Other Ambulatory Visit: Payer: Self-pay | Admitting: Dermatology

## 2011-11-05 DIAGNOSIS — D0460 Carcinoma in situ of skin of unspecified upper limb, including shoulder: Secondary | ICD-10-CM | POA: Diagnosis not present

## 2011-11-05 DIAGNOSIS — D045 Carcinoma in situ of skin of trunk: Secondary | ICD-10-CM | POA: Diagnosis not present

## 2011-11-05 DIAGNOSIS — C44519 Basal cell carcinoma of skin of other part of trunk: Secondary | ICD-10-CM | POA: Diagnosis not present

## 2011-12-17 DIAGNOSIS — Z881 Allergy status to other antibiotic agents status: Secondary | ICD-10-CM | POA: Diagnosis not present

## 2011-12-17 DIAGNOSIS — Z882 Allergy status to sulfonamides status: Secondary | ICD-10-CM | POA: Diagnosis not present

## 2011-12-17 DIAGNOSIS — I10 Essential (primary) hypertension: Secondary | ICD-10-CM | POA: Diagnosis not present

## 2011-12-17 DIAGNOSIS — R040 Epistaxis: Secondary | ICD-10-CM | POA: Diagnosis not present

## 2011-12-17 DIAGNOSIS — Z79899 Other long term (current) drug therapy: Secondary | ICD-10-CM | POA: Diagnosis not present

## 2011-12-17 DIAGNOSIS — Z880 Allergy status to penicillin: Secondary | ICD-10-CM | POA: Diagnosis not present

## 2011-12-19 DIAGNOSIS — Z4800 Encounter for change or removal of nonsurgical wound dressing: Secondary | ICD-10-CM | POA: Diagnosis not present

## 2011-12-19 DIAGNOSIS — R040 Epistaxis: Secondary | ICD-10-CM | POA: Diagnosis not present

## 2011-12-22 ENCOUNTER — Encounter: Payer: Self-pay | Admitting: Internal Medicine

## 2011-12-22 ENCOUNTER — Ambulatory Visit (INDEPENDENT_AMBULATORY_CARE_PROVIDER_SITE_OTHER): Payer: Medicare Other | Admitting: Internal Medicine

## 2011-12-22 VITALS — BP 132/74 | Temp 98.3°F | Wt 200.0 lb

## 2011-12-22 DIAGNOSIS — R040 Epistaxis: Secondary | ICD-10-CM

## 2011-12-22 DIAGNOSIS — R252 Cramp and spasm: Secondary | ICD-10-CM | POA: Diagnosis not present

## 2011-12-22 DIAGNOSIS — E785 Hyperlipidemia, unspecified: Secondary | ICD-10-CM | POA: Diagnosis not present

## 2011-12-22 LAB — CBC WITH DIFFERENTIAL/PLATELET
Basophils Relative: 0.1 % (ref 0.0–3.0)
Eosinophils Absolute: 0.1 10*3/uL (ref 0.0–0.7)
HCT: 40.4 % (ref 39.0–52.0)
Hemoglobin: 13.6 g/dL (ref 13.0–17.0)
Lymphocytes Relative: 10.1 % — ABNORMAL LOW (ref 12.0–46.0)
Lymphs Abs: 1 10*3/uL (ref 0.7–4.0)
MCHC: 33.8 g/dL (ref 30.0–36.0)
Neutro Abs: 7.6 10*3/uL (ref 1.4–7.7)
RBC: 4.6 Mil/uL (ref 4.22–5.81)
RDW: 12.9 % (ref 11.5–14.6)

## 2011-12-22 LAB — BASIC METABOLIC PANEL
BUN: 12 mg/dL (ref 6–23)
CO2: 26 mEq/L (ref 19–32)
Chloride: 87 mEq/L — ABNORMAL LOW (ref 96–112)
GFR: 97.51 mL/min (ref >60.00)
Glucose, Bld: 112 mg/dL — ABNORMAL HIGH (ref 70–99)
Potassium: 4 mEq/L (ref 3.5–5.1)
Sodium: 122 mEq/L — ABNORMAL LOW (ref 135–145)

## 2011-12-22 LAB — LIPID PANEL
HDL: 33.4 mg/dL — ABNORMAL LOW (ref >39.00)
Total CHOL/HDL Ratio: 6
VLDL: 30.8 mg/dL (ref 0.0–40.0)

## 2011-12-22 NOTE — Progress Notes (Signed)
Patient ID: Matthew [name], male   DOB: Nov 21, 1935, 76 y.o.   MRN: [id number] Epistaxis-- "packed" in ED-- sxs resolved Has had "URI" sxs for 4 days-- resolving  Another complaint- he has multiple cramps at night (legs, hands, "all over")  Reviewed pmh, psh, soc hx   patient denies chest pain, shortness of breath, orthopnea. Denies lower extremity edema, abdominal pain, change in appetite, change in bowel movements. Patient denies rashes, musculoskeletal complaints.    well-developed well-nourished male in no acute distress. HEENT exam atraumatic, normocephalic, neck supple without jugular venous distention. Chest clear to auscultation cardiac exam S1-S2 are regular. Abdominal exam overweight with bowel sounds, soft and nontender. Extremities no edema. Neurologic exam is alert with a normal gait.   A/p- epistaxis- resolved  "cramping"- check labs Will stop hctz and lipitor for 4 weeks and see if sxs resolve- if they do he will resume lipitor

## 2011-12-22 NOTE — Patient Instructions (Signed)
Stop hctz and lipitor for 4 weeks. Resume lipitor in 4 weeks.

## 2011-12-23 DIAGNOSIS — H2510 Age-related nuclear cataract, unspecified eye: Secondary | ICD-10-CM | POA: Diagnosis not present

## 2011-12-24 DIAGNOSIS — Z79899 Other long term (current) drug therapy: Secondary | ICD-10-CM | POA: Diagnosis not present

## 2011-12-24 DIAGNOSIS — I10 Essential (primary) hypertension: Secondary | ICD-10-CM | POA: Diagnosis not present

## 2011-12-24 DIAGNOSIS — Z882 Allergy status to sulfonamides status: Secondary | ICD-10-CM | POA: Diagnosis not present

## 2011-12-24 DIAGNOSIS — Z880 Allergy status to penicillin: Secondary | ICD-10-CM | POA: Diagnosis not present

## 2011-12-24 DIAGNOSIS — R040 Epistaxis: Secondary | ICD-10-CM | POA: Diagnosis not present

## 2011-12-24 DIAGNOSIS — Z881 Allergy status to other antibiotic agents status: Secondary | ICD-10-CM | POA: Diagnosis not present

## 2011-12-25 ENCOUNTER — Other Ambulatory Visit (INDEPENDENT_AMBULATORY_CARE_PROVIDER_SITE_OTHER): Payer: Medicare Other

## 2011-12-25 DIAGNOSIS — E785 Hyperlipidemia, unspecified: Secondary | ICD-10-CM

## 2011-12-25 LAB — BASIC METABOLIC PANEL
Calcium: 9 mg/dL (ref 8.4–10.5)
GFR: 92.29 mL/min (ref >60.00)
Potassium: 4.3 mEq/L (ref 3.5–5.1)
Sodium: 124 mEq/L — ABNORMAL LOW (ref 135–145)

## 2011-12-29 DIAGNOSIS — R040 Epistaxis: Secondary | ICD-10-CM | POA: Diagnosis not present

## 2011-12-30 ENCOUNTER — Telehealth: Payer: Self-pay | Admitting: *Deleted

## 2011-12-30 NOTE — Telephone Encounter (Signed)
Pt wanted DR Swords to know that they took the nasal packing out of his nose and its fine.  His BP is 127/74, and after d/c the lipitor his cramping has stopped.  He is drinking 4 8 oz glasses of water a day and feels great.  He is reluctant on starting the lipitor back.  Please advise

## 2011-12-31 DIAGNOSIS — Z880 Allergy status to penicillin: Secondary | ICD-10-CM | POA: Diagnosis not present

## 2011-12-31 DIAGNOSIS — R040 Epistaxis: Secondary | ICD-10-CM | POA: Diagnosis not present

## 2011-12-31 DIAGNOSIS — I10 Essential (primary) hypertension: Secondary | ICD-10-CM | POA: Diagnosis not present

## 2011-12-31 DIAGNOSIS — K219 Gastro-esophageal reflux disease without esophagitis: Secondary | ICD-10-CM | POA: Diagnosis not present

## 2011-12-31 DIAGNOSIS — R609 Edema, unspecified: Secondary | ICD-10-CM | POA: Diagnosis not present

## 2011-12-31 DIAGNOSIS — Z882 Allergy status to sulfonamides status: Secondary | ICD-10-CM | POA: Diagnosis not present

## 2011-12-31 NOTE — Telephone Encounter (Signed)
Stay offlipitor until next visit

## 2012-01-01 ENCOUNTER — Other Ambulatory Visit (INDEPENDENT_AMBULATORY_CARE_PROVIDER_SITE_OTHER): Payer: Medicare Other

## 2012-01-01 DIAGNOSIS — I10 Essential (primary) hypertension: Secondary | ICD-10-CM | POA: Diagnosis not present

## 2012-01-01 LAB — BASIC METABOLIC PANEL
CO2: 30 mEq/L (ref 19–32)
Chloride: 97 mEq/L (ref 96–112)
Potassium: 4.9 mEq/L (ref 3.5–5.1)

## 2012-01-01 NOTE — Telephone Encounter (Signed)
Called and spoke with pt and pt is aware to stop the Lipitor until next visit with Dr. Cato Mulligan.

## 2012-01-01 NOTE — Telephone Encounter (Signed)
VM from [place] morning stating pt woke up at 5 am with another nosebleed and had to go to the ER for more packing. Pt states he will be in the office on 01/01/12 at 9 am for bloodwork.

## 2012-01-05 DIAGNOSIS — R040 Epistaxis: Secondary | ICD-10-CM | POA: Diagnosis not present

## 2012-01-13 DIAGNOSIS — L820 Inflamed seborrheic keratosis: Secondary | ICD-10-CM | POA: Diagnosis not present

## 2012-01-29 ENCOUNTER — Other Ambulatory Visit (INDEPENDENT_AMBULATORY_CARE_PROVIDER_SITE_OTHER): Payer: Medicare Other

## 2012-01-29 DIAGNOSIS — I10 Essential (primary) hypertension: Secondary | ICD-10-CM | POA: Diagnosis not present

## 2012-01-29 LAB — BASIC METABOLIC PANEL
CO2: 26 mEq/L (ref 19–32)
Chloride: 101 mEq/L (ref 96–112)
Glucose, Bld: 110 mg/dL — ABNORMAL HIGH (ref 70–99)
Potassium: 4.5 mEq/L (ref 3.5–5.1)
Sodium: 137 mEq/L (ref 135–145)

## 2012-02-05 DIAGNOSIS — N529 Male erectile dysfunction, unspecified: Secondary | ICD-10-CM | POA: Diagnosis not present

## 2012-02-05 DIAGNOSIS — C61 Malignant neoplasm of prostate: Secondary | ICD-10-CM | POA: Diagnosis not present

## 2012-02-05 DIAGNOSIS — N393 Stress incontinence (female) (male): Secondary | ICD-10-CM | POA: Diagnosis not present

## 2012-02-15 ENCOUNTER — Ambulatory Visit (INDEPENDENT_AMBULATORY_CARE_PROVIDER_SITE_OTHER): Payer: Medicare Other | Admitting: Internal Medicine

## 2012-02-15 ENCOUNTER — Encounter: Payer: Self-pay | Admitting: Internal Medicine

## 2012-02-15 VITALS — BP 162/94 | HR 72 | Temp 98.1°F | Ht 68.0 in | Wt 202.0 lb

## 2012-02-15 DIAGNOSIS — E785 Hyperlipidemia, unspecified: Secondary | ICD-10-CM

## 2012-02-15 DIAGNOSIS — E669 Obesity, unspecified: Secondary | ICD-10-CM | POA: Diagnosis not present

## 2012-02-15 DIAGNOSIS — R739 Hyperglycemia, unspecified: Secondary | ICD-10-CM

## 2012-02-15 DIAGNOSIS — I10 Essential (primary) hypertension: Secondary | ICD-10-CM

## 2012-02-15 DIAGNOSIS — R7309 Other abnormal glucose: Secondary | ICD-10-CM | POA: Diagnosis not present

## 2012-02-15 LAB — BASIC METABOLIC PANEL
BUN: 13 mg/dL (ref 6–23)
CO2: 28 mEq/L (ref 19–32)
Chloride: 104 mEq/L (ref 96–112)
Creatinine, Ser: 0.9 mg/dL (ref 0.4–1.5)
Glucose, Bld: 114 mg/dL — ABNORMAL HIGH (ref 70–99)

## 2012-02-15 LAB — LIPID PANEL
Total CHOL/HDL Ratio: 8
Triglycerides: 138 mg/dL (ref 0.0–149.0)

## 2012-02-15 MED ORDER — AMLODIPINE BESYLATE 5 MG PO TABS: 5.0000 mg | ORAL_TABLET | Freq: Every day | ORAL | Status: DC

## 2012-02-15 NOTE — Patient Instructions (Signed)
Go home and watch "forks over knives"

## 2012-02-15 NOTE — Progress Notes (Signed)
Patient ID: Matthew [name], male   DOB: 1936/01/11, 77 y.o.   MRN: [id number] htn-- no home bps recently. Says bp was "normal" at ENT 125/75  Epistaxis- resolved with mupirocin/saline nasal spray  Reviewed pmh, psh, sochx   patient denies chest pain, shortness of breath, orthopnea. Denies lower extremity edema, abdominal pain, change in appetite, change in bowel movements. Patient denies rashes, musculoskeletal complaints. No other specific complaints in a complete review of systems.    well-developed well-nourished male in no acute distress. HEENT exam atraumatic, normocephalic, neck supple without jugular venous distention. Chest clear to auscultation cardiac exam S1-S2 are regular. Abdominal exam overweight with bowel sounds, soft and nontender. Extremities no edema. Neurologic exam is alert with a normal gait.

## 2012-02-15 NOTE — Assessment & Plan Note (Signed)
Needs f/u Desperately need sot lose weight He should follow a vegan diet-- it would likely take care of BP, blood sugar, gerd, etc..Marland Kitchen

## 2012-02-15 NOTE — Assessment & Plan Note (Addendum)
Not controlled initially Repeat bp 138/83 i had a long term discussion with patient about his health. If his desire is to stay healthy fo rthe rest of his life he needs to start taking care of himself. He should stop eating all animal fat. He should aim for a goal weight of 165 pounds.  Daily exercise

## 2012-02-15 NOTE — Assessment & Plan Note (Signed)
Intolerant to statins-- Recommend vegan diet

## 2012-02-16 DIAGNOSIS — E669 Obesity, unspecified: Secondary | ICD-10-CM | POA: Insufficient documentation

## 2012-02-16 NOTE — Assessment & Plan Note (Signed)
Discussed aggressive weight loss and daily exercise

## 2012-02-17 ENCOUNTER — Other Ambulatory Visit: Payer: Self-pay | Admitting: Dermatology

## 2012-02-17 DIAGNOSIS — D0470 Carcinoma in situ of skin of unspecified lower limb, including hip: Secondary | ICD-10-CM | POA: Diagnosis not present

## 2012-02-17 DIAGNOSIS — D485 Neoplasm of uncertain behavior of skin: Secondary | ICD-10-CM | POA: Diagnosis not present

## 2012-02-17 DIAGNOSIS — L821 Other seborrheic keratosis: Secondary | ICD-10-CM | POA: Diagnosis not present

## 2012-02-17 DIAGNOSIS — C4441 Basal cell carcinoma of skin of scalp and neck: Secondary | ICD-10-CM | POA: Diagnosis not present

## 2012-02-17 DIAGNOSIS — Z85828 Personal history of other malignant neoplasm of skin: Secondary | ICD-10-CM | POA: Diagnosis not present

## 2012-02-17 DIAGNOSIS — D0460 Carcinoma in situ of skin of unspecified upper limb, including shoulder: Secondary | ICD-10-CM | POA: Diagnosis not present

## 2012-02-17 DIAGNOSIS — C44319 Basal cell carcinoma of skin of other parts of face: Secondary | ICD-10-CM | POA: Diagnosis not present

## 2012-02-17 DIAGNOSIS — L570 Actinic keratosis: Secondary | ICD-10-CM | POA: Diagnosis not present

## 2012-03-07 ENCOUNTER — Other Ambulatory Visit: Payer: Self-pay | Admitting: *Deleted

## 2012-03-07 DIAGNOSIS — I10 Essential (primary) hypertension: Secondary | ICD-10-CM

## 2012-03-07 MED ORDER — AMLODIPINE BESYLATE 5 MG PO TABS: 5.0000 mg | ORAL_TABLET | Freq: Every day | ORAL | Status: DC

## 2012-03-24 DIAGNOSIS — N393 Stress incontinence (female) (male): Secondary | ICD-10-CM | POA: Diagnosis not present

## 2012-03-24 DIAGNOSIS — N529 Male erectile dysfunction, unspecified: Secondary | ICD-10-CM | POA: Diagnosis not present

## 2012-03-24 DIAGNOSIS — Z8546 Personal history of malignant neoplasm of prostate: Secondary | ICD-10-CM | POA: Diagnosis not present

## 2012-04-25 ENCOUNTER — Encounter: Payer: Self-pay | Admitting: Internal Medicine

## 2012-04-25 ENCOUNTER — Ambulatory Visit (INDEPENDENT_AMBULATORY_CARE_PROVIDER_SITE_OTHER): Payer: Medicare Other | Admitting: Internal Medicine

## 2012-04-25 VITALS — BP 132/74 | HR 74 | Temp 97.4°F | Wt 196.0 lb

## 2012-04-25 DIAGNOSIS — R7309 Other abnormal glucose: Secondary | ICD-10-CM

## 2012-04-25 DIAGNOSIS — I10 Essential (primary) hypertension: Secondary | ICD-10-CM

## 2012-04-25 DIAGNOSIS — R739 Hyperglycemia, unspecified: Secondary | ICD-10-CM

## 2012-04-25 NOTE — Assessment & Plan Note (Signed)
Well controlled Continue meds 

## 2012-04-25 NOTE — Progress Notes (Signed)
Patient ID: Matthew [name], male   DOB: 12-17-1935, 77 y.o.   MRN: [id number] htn-- home bps- 120s/70s after exercise  He feels well He is exercising some  Reviewed pmh, psh, sochx   patient denies chest pain, shortness of breath, orthopnea. Denies lower extremity edema, abdominal pain, change in appetite, change in bowel movements. Patient denies rashes, musculoskeletal complaints. No other specific complaints in a complete review of systems.    well-developed well-nourished male in no acute distress. HEENT exam atraumatic, normocephalic, neck supple without jugular venous distention. Chest clear to auscultation cardiac exam S1-S2 are regular. Abdominal exam overweight with bowel sounds, soft and nontender. Extremities no edema. Neurologic exam is alert with a normal gait.

## 2012-04-25 NOTE — Assessment & Plan Note (Signed)
Lab Results  Component Value Date   HGBA1C 5.9 02/15/2012   Previously controlled  Needs continued weight loss

## 2012-04-26 DIAGNOSIS — N529 Male erectile dysfunction, unspecified: Secondary | ICD-10-CM | POA: Diagnosis not present

## 2012-04-26 DIAGNOSIS — N393 Stress incontinence (female) (male): Secondary | ICD-10-CM | POA: Diagnosis not present

## 2012-04-26 DIAGNOSIS — Z8546 Personal history of malignant neoplasm of prostate: Secondary | ICD-10-CM | POA: Diagnosis not present

## 2012-05-05 DIAGNOSIS — H2510 Age-related nuclear cataract, unspecified eye: Secondary | ICD-10-CM | POA: Diagnosis not present

## 2012-05-16 DIAGNOSIS — N529 Male erectile dysfunction, unspecified: Secondary | ICD-10-CM | POA: Diagnosis not present

## 2012-05-16 DIAGNOSIS — I10 Essential (primary) hypertension: Secondary | ICD-10-CM | POA: Diagnosis not present

## 2012-05-16 DIAGNOSIS — N393 Stress incontinence (female) (male): Secondary | ICD-10-CM | POA: Diagnosis not present

## 2012-05-16 DIAGNOSIS — Z01818 Encounter for other preprocedural examination: Secondary | ICD-10-CM | POA: Diagnosis not present

## 2012-05-16 DIAGNOSIS — I498 Other specified cardiac arrhythmias: Secondary | ICD-10-CM | POA: Diagnosis not present

## 2012-05-16 DIAGNOSIS — C61 Malignant neoplasm of prostate: Secondary | ICD-10-CM | POA: Diagnosis not present

## 2012-05-30 DIAGNOSIS — IMO0002 Reserved for concepts with insufficient information to code with codable children: Secondary | ICD-10-CM | POA: Diagnosis not present

## 2012-05-30 DIAGNOSIS — I10 Essential (primary) hypertension: Secondary | ICD-10-CM | POA: Diagnosis not present

## 2012-05-30 DIAGNOSIS — R32 Unspecified urinary incontinence: Secondary | ICD-10-CM | POA: Diagnosis not present

## 2012-05-30 DIAGNOSIS — N39498 Other specified urinary incontinence: Secondary | ICD-10-CM | POA: Diagnosis not present

## 2012-05-30 DIAGNOSIS — N393 Stress incontinence (female) (male): Secondary | ICD-10-CM | POA: Diagnosis not present

## 2012-05-30 DIAGNOSIS — N529 Male erectile dysfunction, unspecified: Secondary | ICD-10-CM | POA: Diagnosis not present

## 2012-05-31 DIAGNOSIS — N529 Male erectile dysfunction, unspecified: Secondary | ICD-10-CM | POA: Diagnosis not present

## 2012-05-31 DIAGNOSIS — I10 Essential (primary) hypertension: Secondary | ICD-10-CM | POA: Diagnosis not present

## 2012-05-31 DIAGNOSIS — R32 Unspecified urinary incontinence: Secondary | ICD-10-CM | POA: Diagnosis not present

## 2012-06-03 DIAGNOSIS — R339 Retention of urine, unspecified: Secondary | ICD-10-CM | POA: Diagnosis not present

## 2012-06-07 DIAGNOSIS — N393 Stress incontinence (female) (male): Secondary | ICD-10-CM | POA: Diagnosis not present

## 2012-06-24 DIAGNOSIS — N393 Stress incontinence (female) (male): Secondary | ICD-10-CM | POA: Diagnosis not present

## 2012-07-13 ENCOUNTER — Other Ambulatory Visit: Payer: Self-pay | Admitting: Dermatology

## 2012-07-13 DIAGNOSIS — D485 Neoplasm of uncertain behavior of skin: Secondary | ICD-10-CM | POA: Diagnosis not present

## 2012-07-13 DIAGNOSIS — Z85828 Personal history of other malignant neoplasm of skin: Secondary | ICD-10-CM | POA: Diagnosis not present

## 2012-07-13 DIAGNOSIS — D045 Carcinoma in situ of skin of trunk: Secondary | ICD-10-CM | POA: Diagnosis not present

## 2012-07-13 DIAGNOSIS — L821 Other seborrheic keratosis: Secondary | ICD-10-CM | POA: Diagnosis not present

## 2012-07-13 DIAGNOSIS — L820 Inflamed seborrheic keratosis: Secondary | ICD-10-CM | POA: Diagnosis not present

## 2012-08-08 DIAGNOSIS — C61 Malignant neoplasm of prostate: Secondary | ICD-10-CM | POA: Diagnosis not present

## 2012-09-19 ENCOUNTER — Other Ambulatory Visit: Payer: Self-pay | Admitting: *Deleted

## 2012-09-19 MED ORDER — LISINOPRIL 40 MG PO TABS: 40.0000 mg | ORAL_TABLET | Freq: Every day | ORAL | Status: DC

## 2012-10-04 DIAGNOSIS — N393 Stress incontinence (female) (male): Secondary | ICD-10-CM | POA: Diagnosis not present

## 2012-10-04 DIAGNOSIS — Z8546 Personal history of malignant neoplasm of prostate: Secondary | ICD-10-CM | POA: Diagnosis not present

## 2012-10-04 DIAGNOSIS — N529 Male erectile dysfunction, unspecified: Secondary | ICD-10-CM | POA: Diagnosis not present

## 2012-10-24 ENCOUNTER — Ambulatory Visit (INDEPENDENT_AMBULATORY_CARE_PROVIDER_SITE_OTHER): Payer: Medicare Other | Admitting: Internal Medicine

## 2012-10-24 ENCOUNTER — Encounter: Payer: Self-pay | Admitting: Internal Medicine

## 2012-10-24 VITALS — BP 140/80 | HR 68 | Temp 98.0°F | Wt 190.0 lb

## 2012-10-24 DIAGNOSIS — R7309 Other abnormal glucose: Secondary | ICD-10-CM | POA: Diagnosis not present

## 2012-10-24 DIAGNOSIS — K219 Gastro-esophageal reflux disease without esophagitis: Secondary | ICD-10-CM | POA: Diagnosis not present

## 2012-10-24 DIAGNOSIS — R739 Hyperglycemia, unspecified: Secondary | ICD-10-CM

## 2012-10-24 DIAGNOSIS — Z23 Encounter for immunization: Secondary | ICD-10-CM

## 2012-10-24 DIAGNOSIS — I10 Essential (primary) hypertension: Secondary | ICD-10-CM | POA: Diagnosis not present

## 2012-10-24 DIAGNOSIS — E785 Hyperlipidemia, unspecified: Secondary | ICD-10-CM

## 2012-10-24 LAB — HEPATIC FUNCTION PANEL
ALT: 19 U/L (ref 0–53)
Albumin: 3.8 g/dL (ref 3.5–5.2)
Total Protein: 6.9 g/dL (ref 6.0–8.3)

## 2012-10-24 LAB — LIPID PANEL
HDL: 40.4 mg/dL (ref >39.00)
Triglycerides: 117 mg/dL (ref 0.0–149.0)
VLDL: 23.4 mg/dL (ref 0.0–40.0)

## 2012-10-24 LAB — BASIC METABOLIC PANEL
BUN: 15 mg/dL (ref 6–23)
CO2: 29 mEq/L (ref 19–32)
Calcium: 9.5 mg/dL (ref 8.4–10.5)
Chloride: 103 mEq/L (ref 96–112)
Creatinine, Ser: 0.8 mg/dL (ref 0.4–1.5)
Glucose, Bld: 109 mg/dL — ABNORMAL HIGH (ref 70–99)

## 2012-10-24 LAB — HEMOGLOBIN A1C: Hgb A1c MFr Bld: 6.4 % (ref 4.6–6.5)

## 2012-10-24 LAB — LDL CHOLESTEROL, DIRECT: Direct LDL: 223.3 mg/dL

## 2012-10-24 LAB — TSH: TSH: 1.23 u[IU]/mL (ref 0.35–5.50)

## 2012-10-24 NOTE — Assessment & Plan Note (Signed)
No sxs on PPI 

## 2012-10-24 NOTE — Assessment & Plan Note (Signed)
Will be checking other labs today so will check bmet

## 2012-10-24 NOTE — Assessment & Plan Note (Signed)
Check lipids He needs to exercise daily Not able to take statins

## 2012-10-24 NOTE — Progress Notes (Signed)
htn- tolerating meds. Regularly checks home bps  gerd- no sxs on ppi  Lipids and hyperglycemia- may need f/u Lab Results  Component Value Date   HGBA1C 5.9 02/15/2012   Lipid Panel     Component Value Date/Time   CHOL 247* 02/15/2012 0919   TRIG 138.0 02/15/2012 0919   HDL 32.90* 02/15/2012 0919   CHOLHDL 8 02/15/2012 0919   VLDL 27.6 02/15/2012 0919   LDLCALC 128* 12/22/2011 0911    Reviewed pmh, psh, sochx, meds sochx- they are moving locations within [employer].  Ros- pt remains active and has no specific c/o in a complete ROS  Exam: reviewed vitals  well-developed well-nourished male in no acute distress. HEENT exam atraumatic, normocephalic, neck supple without jugular venous distention. Chest clear to auscultation cardiac exam S1-S2 are regular. Abdominal exam overweight with bowel sounds, soft and nontender. Extremities no edema. Neurologic exam is alert with a normal gait.

## 2012-10-24 NOTE — Assessment & Plan Note (Signed)
Check a1c today

## 2012-10-25 ENCOUNTER — Other Ambulatory Visit: Payer: Self-pay | Admitting: Internal Medicine

## 2012-10-25 DIAGNOSIS — E785 Hyperlipidemia, unspecified: Secondary | ICD-10-CM

## 2012-10-25 NOTE — Addendum Note (Signed)
Addended by: Alfred Levins D on: 10/25/2012 08:10 AM   Modules accepted: Orders, Medications

## 2012-11-21 ENCOUNTER — Other Ambulatory Visit: Payer: Self-pay | Admitting: Dermatology

## 2012-11-21 DIAGNOSIS — D485 Neoplasm of uncertain behavior of skin: Secondary | ICD-10-CM | POA: Diagnosis not present

## 2012-11-21 DIAGNOSIS — C44519 Basal cell carcinoma of skin of other part of trunk: Secondary | ICD-10-CM | POA: Diagnosis not present

## 2012-11-21 DIAGNOSIS — C44611 Basal cell carcinoma of skin of unspecified upper limb, including shoulder: Secondary | ICD-10-CM | POA: Diagnosis not present

## 2012-11-21 DIAGNOSIS — Z85828 Personal history of other malignant neoplasm of skin: Secondary | ICD-10-CM | POA: Diagnosis not present

## 2012-11-21 DIAGNOSIS — L570 Actinic keratosis: Secondary | ICD-10-CM | POA: Diagnosis not present

## 2012-11-21 DIAGNOSIS — L821 Other seborrheic keratosis: Secondary | ICD-10-CM | POA: Diagnosis not present

## 2013-01-16 ENCOUNTER — Other Ambulatory Visit: Payer: Self-pay | Admitting: *Deleted

## 2013-01-16 DIAGNOSIS — K219 Gastro-esophageal reflux disease without esophagitis: Secondary | ICD-10-CM

## 2013-01-16 MED ORDER — OMEPRAZOLE 20 MG PO CPDR: 20.0000 mg | DELAYED_RELEASE_CAPSULE | Freq: Two times a day (BID) | ORAL | Status: DC

## 2013-01-18 ENCOUNTER — Other Ambulatory Visit: Payer: Self-pay | Admitting: *Deleted

## 2013-01-18 DIAGNOSIS — K219 Gastro-esophageal reflux disease without esophagitis: Secondary | ICD-10-CM

## 2013-01-18 MED ORDER — OMEPRAZOLE 20 MG PO CPDR: 20.0000 mg | DELAYED_RELEASE_CAPSULE | Freq: Two times a day (BID) | ORAL | Status: DC

## 2013-01-18 NOTE — Addendum Note (Signed)
Addended by: Alfred Levins D on: 01/18/2013 03:46 PM   Modules accepted: Orders

## 2013-01-24 ENCOUNTER — Encounter: Payer: Self-pay | Admitting: Internal Medicine

## 2013-01-24 ENCOUNTER — Ambulatory Visit (INDEPENDENT_AMBULATORY_CARE_PROVIDER_SITE_OTHER): Payer: Medicare Other | Admitting: Internal Medicine

## 2013-01-24 VITALS — BP 140/78 | HR 72 | Temp 98.1°F | Ht 68.0 in | Wt 204.0 lb

## 2013-01-24 DIAGNOSIS — R7309 Other abnormal glucose: Secondary | ICD-10-CM | POA: Diagnosis not present

## 2013-01-24 DIAGNOSIS — I10 Essential (primary) hypertension: Secondary | ICD-10-CM

## 2013-01-24 DIAGNOSIS — R739 Hyperglycemia, unspecified: Secondary | ICD-10-CM

## 2013-01-24 DIAGNOSIS — E785 Hyperlipidemia, unspecified: Secondary | ICD-10-CM | POA: Diagnosis not present

## 2013-01-24 NOTE — Assessment & Plan Note (Signed)
Basic Metabolic Panel:    Component Value Date/Time   NA 137 10/24/2012 0739   K 4.5 10/24/2012 0739   CL 103 10/24/2012 0739   CO2 29 10/24/2012 0739   BUN 15 10/24/2012 0739   CREATININE 0.8 10/24/2012 0739   GLUCOSE 109* 10/24/2012 0739   CALCIUM 9.5 10/24/2012 0739   Lab Results  Component Value Date   HGBA1C 6.4 10/24/2012   Will continue to follow

## 2013-01-24 NOTE — Progress Notes (Signed)
htn- tolerating meds Home BPs: 124-150/70-84  Lipids- unable to tolerate statins  He is exercising regularly and he admits to eating too much.   Past Medical History  Diagnosis Date  . ADENOCARCINOMA, PROSTATE 10/26/2007  . HYPERLIPIDEMIA 11/09/2006  . GERD 04/25/2007  . COLONIC POLYPS, HX OF 08/26/2006  . Epistaxis 12/2011    [facility] ED- records not available    History   Social History  . Marital Status: Married    Spouse Name: N/A    Number of Children: N/A  . Years of Education: N/A   Occupational History  . Not on file.   Social History Main Topics  . Smoking status: Former Smoker    Types: Pipe  . Smokeless tobacco: Not on file  . Alcohol Use: Not on file  . Drug Use: Not on file  . Sexual Activity: Not on file   Other Topics Concern  . 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 Tonsillectomy and adenoidectomy    . Prostatectomy  2009    Family History  Problem Relation Age of Onset  . Dementia Mother     Allergies  Allergen Reactions  . Atorvastatin Other (See Comments)    Muscle cramps  . Erythromycin     REACTION: hives  . Levofloxacin     REACTION: joint swelling  . Penicillins   . Sulfonamide Derivatives     REACTION: swelling  . Tetracycline Hcl     REACTION: unspecified    Current Outpatient Prescriptions on File Prior to Visit  Medication Sig Dispense Refill  . Ascorbic Acid (VITAMIN C) 500 MG tablet Take 500 mg by mouth daily.        Marland Kitchen lisinopril (PRINIVIL,ZESTRIL) 40 MG tablet Take 1 tablet (40 mg total) by mouth daily.  90 tablet  3  . multivitamin (THERAGRAN) per tablet Take 1 tablet by mouth daily.        Marland Kitchen omeprazole (PRILOSEC) 20 MG capsule Take 1 capsule (20 mg total) by mouth 2 (two) times daily.  180 capsule  3   No current facility-administered medications on file prior to visit.     patient denies  chest pain, shortness of breath, orthopnea. Denies lower extremity edema, abdominal pain, change in appetite, change in bowel movements. Patient denies rashes, musculoskeletal complaints. No other specific complaints in a complete review of systems.   BP 154/82  Pulse 72  Temp(Src) 98.1 F (36.7 C) (Oral)  Ht 5\' 8"  (1.727 m)  Wt 204 lb (92.534 kg)  BMI 31.03 kg/m2    well-developed well-nourished male in no acute distress. HEENT exam atraumatic, normocephalic, neck supple without jugular venous distention. Chest clear to auscultation cardiac exam S1-S2 are regular. Abdominal exam overweight with bowel sounds, soft and nontender. Extremities no edema. Neurologic exam is alert with a normal gait.   HYPERLIPIDEMIA Lipid Panel     Component Value Date/Time   CHOL 288* 10/24/2012 0739   TRIG 117.0 10/24/2012 0739   HDL 40.40 10/24/2012 0739   CHOLHDL 7 10/24/2012 0739   VLDL 23.4 10/24/2012 0739   LDLCALC 128* 12/22/2011 0911  unable to tolerate meds    Hyperglycemia Basic Metabolic Panel:    Component Value Date/Time   NA 137 10/24/2012 0739   K 4.5 10/24/2012 0739   CL 103 10/24/2012 0739   CO2 29 10/24/2012 0739   BUN 15  10/24/2012 0739   CREATININE 0.8 10/24/2012 0739   GLUCOSE 109* 10/24/2012 0739   CALCIUM 9.5 10/24/2012 0739   Lab Results  Component Value Date   HGBA1C 6.4 10/24/2012   Will continue to follow  HTN (hypertension) Reasonable control Continue meds

## 2013-01-24 NOTE — Assessment & Plan Note (Signed)
Reasonable control  Continue meds 

## 2013-01-24 NOTE — Assessment & Plan Note (Signed)
Lipid Panel     Component Value Date/Time   CHOL 288* 10/24/2012 0739   TRIG 117.0 10/24/2012 0739   HDL 40.40 10/24/2012 0739   CHOLHDL 7 10/24/2012 0739   VLDL 23.4 10/24/2012 0739   LDLCALC 128* 12/22/2011 0911  unable to tolerate meds

## 2013-01-24 NOTE — Progress Notes (Signed)
Pre visit review using our clinic review tool, if applicable. No additional management support is needed unless otherwise documented below in the visit note. 

## 2013-02-09 ENCOUNTER — Telehealth: Payer: Self-pay | Admitting: *Deleted

## 2013-02-09 NOTE — Telephone Encounter (Signed)
Pt called and left voicemail stating that he takes amlodipine 5 mg not 10 mg daily.  Med list updated

## 2013-02-13 DIAGNOSIS — I10 Essential (primary) hypertension: Secondary | ICD-10-CM | POA: Diagnosis not present

## 2013-02-13 DIAGNOSIS — C61 Malignant neoplasm of prostate: Secondary | ICD-10-CM | POA: Diagnosis not present

## 2013-02-13 DIAGNOSIS — N529 Male erectile dysfunction, unspecified: Secondary | ICD-10-CM | POA: Diagnosis not present

## 2013-02-13 DIAGNOSIS — N393 Stress incontinence (female) (male): Secondary | ICD-10-CM | POA: Diagnosis not present

## 2013-04-11 DIAGNOSIS — Z0000 Encounter for general adult medical examination without abnormal findings: Secondary | ICD-10-CM | POA: Diagnosis not present

## 2013-04-18 DIAGNOSIS — Z8546 Personal history of malignant neoplasm of prostate: Secondary | ICD-10-CM | POA: Diagnosis not present

## 2013-04-18 DIAGNOSIS — N486 Induration penis plastica: Secondary | ICD-10-CM | POA: Diagnosis not present

## 2013-04-18 DIAGNOSIS — N393 Stress incontinence (female) (male): Secondary | ICD-10-CM | POA: Diagnosis not present

## 2013-05-24 ENCOUNTER — Telehealth: Payer: Self-pay | Admitting: Internal Medicine

## 2013-05-24 MED ORDER — AMLODIPINE BESYLATE 5 MG PO TABS: 5.0000 mg | ORAL_TABLET | Freq: Every day | ORAL | Status: DC

## 2013-05-24 NOTE — Telephone Encounter (Signed)
rx sent in electronically 

## 2013-05-24 NOTE — Telephone Encounter (Signed)
[facility] (MAIL ORDER) [facility] - [place], [address] requesting re-fill on amLODipine (NORVASC) 5 MG tablet

## 2013-05-31 ENCOUNTER — Telehealth: Payer: Self-pay | Admitting: *Deleted

## 2013-05-31 NOTE — Telephone Encounter (Signed)
Pt wants to switch to Padonda Campbell from Dr Swords.  Ok per Dr Swords and Padonda 

## 2013-07-26 ENCOUNTER — Ambulatory Visit: Payer: Medicare Other | Admitting: Internal Medicine

## 2013-07-26 ENCOUNTER — Ambulatory Visit (INDEPENDENT_AMBULATORY_CARE_PROVIDER_SITE_OTHER): Payer: Medicare Other | Admitting: Internal Medicine

## 2013-07-26 ENCOUNTER — Encounter: Payer: Self-pay | Admitting: Internal Medicine

## 2013-07-26 VITALS — BP 124/72 | HR 76 | Temp 98.0°F | Ht 68.0 in | Wt 207.0 lb

## 2013-07-26 DIAGNOSIS — R7309 Other abnormal glucose: Secondary | ICD-10-CM | POA: Diagnosis not present

## 2013-07-26 DIAGNOSIS — E785 Hyperlipidemia, unspecified: Secondary | ICD-10-CM

## 2013-07-26 DIAGNOSIS — I10 Essential (primary) hypertension: Secondary | ICD-10-CM | POA: Diagnosis not present

## 2013-07-26 DIAGNOSIS — R739 Hyperglycemia, unspecified: Secondary | ICD-10-CM

## 2013-07-26 DIAGNOSIS — C61 Malignant neoplasm of prostate: Secondary | ICD-10-CM | POA: Diagnosis not present

## 2013-07-26 LAB — BASIC METABOLIC PANEL
BUN: 15 mg/dL (ref 6–23)
CALCIUM: 9.5 mg/dL (ref 8.4–10.5)
CO2: 30 meq/L (ref 19–32)
CREATININE: 0.9 mg/dL (ref 0.4–1.5)
Chloride: 103 mEq/L (ref 96–112)
GFR: 89.49 mL/min (ref >60.00)
GLUCOSE: 108 mg/dL — AB (ref 70–99)
Potassium: 4 mEq/L (ref 3.5–5.1)
Sodium: 138 mEq/L (ref 135–145)

## 2013-07-26 LAB — HEMOGLOBIN A1C: Hgb A1c MFr Bld: 6.1 % (ref 4.6–6.5)

## 2013-07-26 NOTE — Assessment & Plan Note (Signed)
Lab Results  Component Value Date   HGBA1C 6.4 10/24/2012   Should recheck today- especially given weight gain

## 2013-07-26 NOTE — Progress Notes (Signed)
Pre visit review using our clinic review tool, if applicable. No additional management support is needed unless otherwise documented below in the visit note. 

## 2013-07-26 NOTE — Progress Notes (Signed)
htn-- tolerating meds  Prostate CA- has f/u with urology  Lipids- unable to tolerate statins  GERD- controlled on PPI  Past Medical History  Diagnosis Date  . ADENOCARCINOMA, PROSTATE 10/26/2007  . HYPERLIPIDEMIA 11/09/2006  . GERD 04/25/2007  . COLONIC POLYPS, HX OF 08/26/2006  . Epistaxis 12/2011    [facility] ED- records not available    History   Social History  . Marital Status: Married    Spouse Name: N/A    Number of Children: N/A  . Years of Education: N/A   Occupational History  . Not on file.   Social History Main Topics  . Smoking status: Former Smoker    Types: Pipe  . Smokeless tobacco: Not on file  . Alcohol Use: Not on file  . Drug Use: Not on file  . Sexual Activity: Not on file   Other Topics Concern  . 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 Tonsillectomy and adenoidectomy    . Prostatectomy  2009    Family History  Problem Relation Age of Onset  . Dementia Mother     Allergies  Allergen Reactions  . Atorvastatin Other (See Comments)    Muscle cramps  . Erythromycin     REACTION: hives  . Levofloxacin     REACTION: joint swelling  . Penicillins   . Sulfonamide Derivatives     REACTION: swelling  . Tetracycline Hcl     REACTION: unspecified    Current Outpatient Prescriptions on File Prior to Visit  Medication Sig Dispense Refill  . amLODipine (NORVASC) 5 MG tablet Take 1 tablet (5 mg total) by mouth daily.  90 tablet  3  . Ascorbic Acid (VITAMIN C) 500 MG tablet Take 500 mg by mouth daily.        Marland Kitchen lisinopril (PRINIVIL,ZESTRIL) 40 MG tablet Take 1 tablet (40 mg total) by mouth daily.  90 tablet  3  . loratadine (CLARITIN) 10 MG tablet Take 10 mg by mouth daily.      . multivitamin (THERAGRAN) per tablet Take 1 tablet by mouth daily.        Marland Kitchen omeprazole (PRILOSEC) 20 MG capsule Take 1 capsule (20 mg total) by  mouth 2 (two) times daily.  180 capsule  3  . potassium phosphate, monobasic, (K-PHOS ORIGINAL) 500 MG tablet Take 500 mg by mouth daily.       No current facility-administered medications on file prior to visit.     patient denies chest pain, shortness of breath, orthopnea. Denies lower extremity edema, abdominal pain, change in appetite, change in bowel movements. Patient denies rashes, musculoskeletal complaints. No other specific complaints in a complete review of systems.   BP 124/72  Pulse 76  Temp(Src) 98 F (36.7 C) (Oral)  Ht 5\' 8"  (1.727 m)  Wt 207 lb (93.895 kg)  BMI 31.48 kg/m2  well-developed well-nourished male in no acute distress. HEENT exam atraumatic, normocephalic, neck supple without jugular venous distention. Chest clear to auscultation cardiac exam S1-S2 are regular. Abdominal exam overweight with bowel sounds, soft and nontender. Extremities no edema. Neurologic exam is alert with a normal gait.  HTN (hypertension) Well controlled Continue same meds  GERD Controlled PPI continue  ADENOCARCINOMA, PROSTATE Has regular f/u with urology  Hyperglycemia Lab Results  Component Value Date   HGBA1C 6.4 10/24/2012   Should recheck today- especially given weight  gain  HYPERLIPIDEMIA Unable to tolerate statins

## 2013-07-26 NOTE — Assessment & Plan Note (Signed)
Unable to tolerate statins 

## 2013-07-26 NOTE — Assessment & Plan Note (Signed)
Has regular f/u with urology

## 2013-07-26 NOTE — Assessment & Plan Note (Signed)
Controlled PPI continue

## 2013-07-26 NOTE — Assessment & Plan Note (Signed)
Well controlled Continue same meds 

## 2013-08-21 DIAGNOSIS — Z888 Allergy status to other drugs, medicaments and biological substances status: Secondary | ICD-10-CM | POA: Diagnosis not present

## 2013-08-21 DIAGNOSIS — N529 Male erectile dysfunction, unspecified: Secondary | ICD-10-CM | POA: Diagnosis not present

## 2013-08-21 DIAGNOSIS — C61 Malignant neoplasm of prostate: Secondary | ICD-10-CM | POA: Diagnosis not present

## 2013-08-21 DIAGNOSIS — Z8546 Personal history of malignant neoplasm of prostate: Secondary | ICD-10-CM | POA: Diagnosis not present

## 2013-08-21 DIAGNOSIS — Z880 Allergy status to penicillin: Secondary | ICD-10-CM | POA: Diagnosis not present

## 2013-08-21 DIAGNOSIS — Z87891 Personal history of nicotine dependence: Secondary | ICD-10-CM | POA: Diagnosis not present

## 2013-09-18 ENCOUNTER — Other Ambulatory Visit: Payer: Self-pay

## 2013-09-18 MED ORDER — LISINOPRIL 40 MG PO TABS: 40.0000 mg | ORAL_TABLET | Freq: Every day | ORAL | Status: DC

## 2013-09-19 DIAGNOSIS — M6281 Muscle weakness (generalized): Secondary | ICD-10-CM | POA: Diagnosis not present

## 2013-09-19 DIAGNOSIS — M545 Low back pain, unspecified: Secondary | ICD-10-CM | POA: Diagnosis not present

## 2013-09-20 DIAGNOSIS — M545 Low back pain, unspecified: Secondary | ICD-10-CM | POA: Diagnosis not present

## 2013-09-20 DIAGNOSIS — M6281 Muscle weakness (generalized): Secondary | ICD-10-CM | POA: Diagnosis not present

## 2013-09-21 DIAGNOSIS — M6281 Muscle weakness (generalized): Secondary | ICD-10-CM | POA: Diagnosis not present

## 2013-09-21 DIAGNOSIS — M545 Low back pain, unspecified: Secondary | ICD-10-CM | POA: Diagnosis not present

## 2013-09-26 DIAGNOSIS — M545 Low back pain, unspecified: Secondary | ICD-10-CM | POA: Diagnosis not present

## 2013-09-26 DIAGNOSIS — M6281 Muscle weakness (generalized): Secondary | ICD-10-CM | POA: Diagnosis not present

## 2013-09-27 DIAGNOSIS — M545 Low back pain, unspecified: Secondary | ICD-10-CM | POA: Diagnosis not present

## 2013-09-27 DIAGNOSIS — M6281 Muscle weakness (generalized): Secondary | ICD-10-CM | POA: Diagnosis not present

## 2013-09-28 DIAGNOSIS — M6281 Muscle weakness (generalized): Secondary | ICD-10-CM | POA: Diagnosis not present

## 2013-09-28 DIAGNOSIS — M545 Low back pain, unspecified: Secondary | ICD-10-CM | POA: Diagnosis not present

## 2013-10-03 DIAGNOSIS — M545 Low back pain, unspecified: Secondary | ICD-10-CM | POA: Diagnosis not present

## 2013-10-03 DIAGNOSIS — M6281 Muscle weakness (generalized): Secondary | ICD-10-CM | POA: Diagnosis not present

## 2013-10-04 DIAGNOSIS — M545 Low back pain, unspecified: Secondary | ICD-10-CM | POA: Diagnosis not present

## 2013-10-04 DIAGNOSIS — M6281 Muscle weakness (generalized): Secondary | ICD-10-CM | POA: Diagnosis not present

## 2013-10-05 DIAGNOSIS — M545 Low back pain, unspecified: Secondary | ICD-10-CM | POA: Diagnosis not present

## 2013-10-05 DIAGNOSIS — M6281 Muscle weakness (generalized): Secondary | ICD-10-CM | POA: Diagnosis not present

## 2013-10-31 DIAGNOSIS — H2510 Age-related nuclear cataract, unspecified eye: Secondary | ICD-10-CM | POA: Diagnosis not present

## 2013-11-01 DIAGNOSIS — Z23 Encounter for immunization: Secondary | ICD-10-CM | POA: Diagnosis not present

## 2013-11-15 ENCOUNTER — Other Ambulatory Visit: Payer: Self-pay | Admitting: Dermatology

## 2013-11-15 DIAGNOSIS — Z85828 Personal history of other malignant neoplasm of skin: Secondary | ICD-10-CM | POA: Diagnosis not present

## 2013-11-15 DIAGNOSIS — D0461 Carcinoma in situ of skin of right upper limb, including shoulder: Secondary | ICD-10-CM | POA: Diagnosis not present

## 2013-11-15 DIAGNOSIS — D045 Carcinoma in situ of skin of trunk: Secondary | ICD-10-CM | POA: Diagnosis not present

## 2013-11-15 DIAGNOSIS — L918 Other hypertrophic disorders of the skin: Secondary | ICD-10-CM | POA: Diagnosis not present

## 2013-11-15 DIAGNOSIS — L570 Actinic keratosis: Secondary | ICD-10-CM | POA: Diagnosis not present

## 2013-11-15 DIAGNOSIS — D485 Neoplasm of uncertain behavior of skin: Secondary | ICD-10-CM | POA: Diagnosis not present

## 2013-11-15 DIAGNOSIS — L821 Other seborrheic keratosis: Secondary | ICD-10-CM | POA: Diagnosis not present

## 2013-11-28 DIAGNOSIS — M5136 Other intervertebral disc degeneration, lumbar region: Secondary | ICD-10-CM | POA: Diagnosis not present

## 2013-11-28 DIAGNOSIS — M47816 Spondylosis without myelopathy or radiculopathy, lumbar region: Secondary | ICD-10-CM | POA: Diagnosis not present

## 2013-11-28 DIAGNOSIS — M4124 Other idiopathic scoliosis, thoracic region: Secondary | ICD-10-CM | POA: Diagnosis not present

## 2013-11-28 DIAGNOSIS — M545 Low back pain: Secondary | ICD-10-CM | POA: Diagnosis not present

## 2013-11-28 DIAGNOSIS — M51369 Other intervertebral disc degeneration, lumbar region without mention of lumbar back pain or lower extremity pain: Secondary | ICD-10-CM | POA: Insufficient documentation

## 2013-12-15 ENCOUNTER — Ambulatory Visit (INDEPENDENT_AMBULATORY_CARE_PROVIDER_SITE_OTHER): Payer: Medicare Other

## 2013-12-15 DIAGNOSIS — Z23 Encounter for immunization: Secondary | ICD-10-CM | POA: Diagnosis not present

## 2013-12-18 LAB — TB SKIN TEST
Induration: 0 mm
TB Skin Test: NEGATIVE

## 2013-12-26 DIAGNOSIS — M5136 Other intervertebral disc degeneration, lumbar region: Secondary | ICD-10-CM | POA: Diagnosis not present

## 2013-12-26 DIAGNOSIS — M4124 Other idiopathic scoliosis, thoracic region: Secondary | ICD-10-CM | POA: Diagnosis not present

## 2013-12-26 DIAGNOSIS — M47814 Spondylosis without myelopathy or radiculopathy, thoracic region: Secondary | ICD-10-CM | POA: Diagnosis not present

## 2013-12-26 DIAGNOSIS — M47816 Spondylosis without myelopathy or radiculopathy, lumbar region: Secondary | ICD-10-CM | POA: Diagnosis not present

## 2013-12-27 ENCOUNTER — Ambulatory Visit (INDEPENDENT_AMBULATORY_CARE_PROVIDER_SITE_OTHER): Payer: Medicare Other | Admitting: Family

## 2013-12-27 ENCOUNTER — Encounter: Payer: Self-pay | Admitting: Family

## 2013-12-27 VITALS — BP 140/80 | HR 72 | Ht 67.0 in | Wt 205.3 lb

## 2013-12-27 DIAGNOSIS — R2232 Localized swelling, mass and lump, left upper limb: Secondary | ICD-10-CM | POA: Diagnosis not present

## 2013-12-27 DIAGNOSIS — R739 Hyperglycemia, unspecified: Secondary | ICD-10-CM | POA: Diagnosis not present

## 2013-12-27 DIAGNOSIS — Z23 Encounter for immunization: Secondary | ICD-10-CM | POA: Diagnosis not present

## 2013-12-27 DIAGNOSIS — I10 Essential (primary) hypertension: Secondary | ICD-10-CM | POA: Diagnosis not present

## 2013-12-27 DIAGNOSIS — J309 Allergic rhinitis, unspecified: Secondary | ICD-10-CM

## 2013-12-27 DIAGNOSIS — R7309 Other abnormal glucose: Secondary | ICD-10-CM | POA: Diagnosis not present

## 2013-12-27 DIAGNOSIS — K219 Gastro-esophageal reflux disease without esophagitis: Secondary | ICD-10-CM | POA: Diagnosis not present

## 2013-12-27 DIAGNOSIS — Z86010 Personal history of colonic polyps: Secondary | ICD-10-CM

## 2013-12-27 LAB — BASIC METABOLIC PANEL
BUN: 18 mg/dL (ref 6–23)
CO2: 30 mEq/L (ref 19–32)
Calcium: 9.9 mg/dL (ref 8.4–10.5)
Chloride: 99 mEq/L (ref 96–112)
Creat: 0.9 mg/dL (ref 0.50–1.35)
Glucose, Bld: 87 mg/dL (ref 70–99)
POTASSIUM: 4.8 meq/L (ref 3.5–5.3)
Sodium: 137 mEq/L (ref 135–145)

## 2013-12-27 LAB — HEPATIC FUNCTION PANEL
ALT: 37 U/L (ref 0–53)
AST: 31 U/L (ref 0–37)
Albumin: 4.3 g/dL (ref 3.5–5.2)
Alkaline Phosphatase: 72 U/L (ref 39–117)
BILIRUBIN DIRECT: 0.1 mg/dL (ref 0.0–0.3)
BILIRUBIN TOTAL: 0.5 mg/dL (ref 0.2–1.2)
Indirect Bilirubin: 0.4 mg/dL (ref 0.2–1.2)
Total Protein: 7 g/dL (ref 6.0–8.3)

## 2013-12-27 LAB — HEMOGLOBIN A1C
HEMOGLOBIN A1C: 5.8 % — AB (ref <5.7)
Mean Plasma Glucose: 120 mg/dL — ABNORMAL HIGH (ref <117)

## 2013-12-27 NOTE — Addendum Note (Signed)
Addended by: Joyce Gross R on: 12/27/2013 03:03 PM   Modules accepted: Orders

## 2013-12-27 NOTE — Progress Notes (Signed)
Pre visit review using our clinic review tool, if applicable. No additional management support is needed unless otherwise documented below in the visit note. 

## 2013-12-27 NOTE — Progress Notes (Signed)
Subjective:    Patient ID: Matthew [name], male    DOB: 03/08/1935, 78 y.o.   MRN: [id number]  HPI 78 year old white male, nonsmoker with a history of hyperlipidemia GERD, colon polyps, hypertension, hyperglycemia, and overweight is in today to be established. He is doing well. Exercises 3 times per week. Tries to follow healthy diet by decreasing sugars and starches. Last colonoscopy approximately 10 years ago. He has an active and energetic 78 year old male.  Patient has a firm mass to the medial left middle finger present times several months. Recently began to have pain. Pain worse with movement. Waxes and wanes in intensity.   Review of Systems  Constitutional: Negative.   HENT: Negative.   Respiratory: Negative.   Cardiovascular: Negative.   Gastrointestinal: Negative.   Endocrine: Negative.   Genitourinary: Negative.   Musculoskeletal: Negative for back pain, neck pain and neck stiffness.       Mass to the left middle finger  Skin: Negative.   Allergic/Immunologic: Negative.   Neurological: Negative.   Hematological: Negative.   Psychiatric/Behavioral: Negative.    Past Medical History  Diagnosis Date  . ADENOCARCINOMA, PROSTATE 10/26/2007  . HYPERLIPIDEMIA 11/09/2006  . GERD 04/25/2007  . COLONIC POLYPS, HX OF 08/26/2006  . Epistaxis 12/2011    [facility] ED- records not available    History   Social History  . Marital Status: Married    Spouse Name: N/A    Number of Children: N/A  . Years of Education: N/A   Occupational History  . Not on file.   Social History Main Topics  . Smoking status: Former Smoker    Types: Pipe  . Smokeless tobacco: Not on file  . Alcohol Use: Not on file  . Drug Use: Not on file  . Sexual Activity: Not on file   Other Topics Concern  . 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 Tonsillectomy and  adenoidectomy    . Prostatectomy  2009    Family History  Problem Relation Age of Onset  . Dementia Mother     Allergies  Allergen Reactions  . Atorvastatin Other (See Comments)    Muscle cramps  . Erythromycin     REACTION: hives  . Levofloxacin     REACTION: joint swelling  . Penicillins   . Sulfonamide Derivatives     REACTION: swelling  . Tetracycline Hcl     REACTION: unspecified    Current Outpatient Prescriptions on File Prior to Visit  Medication Sig Dispense Refill  . amLODipine (NORVASC) 5 MG tablet Take 1 tablet (5 mg total) by mouth daily. 90 tablet 3  . Ascorbic Acid (VITAMIN C) 500 MG tablet Take 500 mg by mouth daily.      Marland Kitchen lisinopril (PRINIVIL,ZESTRIL) 40 MG tablet Take 1 tablet (40 mg total) by mouth daily. 90 tablet 0  . loratadine (CLARITIN) 10 MG tablet Take 10 mg by mouth daily.    . multivitamin (THERAGRAN) per tablet Take 1 tablet by mouth daily.      Marland Kitchen omeprazole (PRILOSEC) 20 MG capsule Take 20 mg by mouth daily.    . potassium phosphate, monobasic, (K-PHOS ORIGINAL) 500 MG tablet Take 500 mg by mouth daily.     No current facility-administered medications on file prior to visit.    BP 140/80 mmHg  Pulse 72  Ht 5\' 7"  (1.702 m)  Wt 205 lb 4.8 oz (93.123 kg)  BMI 32.15 kg/m2chart    Objective:   Physical Exam  Constitutional: He is oriented to person, place, and time. He appears well-developed and well-nourished.  HENT:  Right Ear: External ear normal.  Left Ear: External ear normal.  Nose: Nose normal.  Mouth/Throat: Oropharynx is clear and moist.  Neck: Normal range of motion. Neck supple.  Cardiovascular: Normal rate, regular rhythm and normal heart sounds.   Pulmonary/Chest: Effort normal and breath sounds normal.  Abdominal: Soft. Bowel sounds are normal.  Musculoskeletal: Normal range of motion.  Left middle finger, firm, mobile mass noted to the medial aspect of the finger. Nontender to palpation. No erythema. Approximately 0.4  mm.  Neurological: He is alert and oriented to person, place, and time.  Skin: Skin is warm and dry.  Psychiatric: He has a normal mood and affect.          Assessment & Plan:  Matthew [name] was seen today for establish care.  Diagnoses and associated orders for this visit:  Essential hypertension, benign - Hepatic Function Panel - Hemoglobin A1c  Allergic rhinitis, unspecified allergic rhinitis type  Gastroesophageal reflux disease without esophagitis  Hyperglycemia - Basic Metabolic Panel - Hepatic Function Panel - Hemoglobin A1c  Need for prophylactic vaccination against Streptococcus pneumoniae (pneumococcus) - Pneumococcal conjugate vaccine 13-valent  Mass of finger of left hand  History of colonic polyps - Ambulatory referral to Gastroenterology    is Despite the guidelines, I believe he is a good candidate for colonoscopy screening therefore I will refer him over to have that performed. Continue current medications. Labs obtained today. Return for complete physical exam and 6 months and sooner as needed fasting.

## 2013-12-27 NOTE — Patient Instructions (Signed)

## 2014-01-02 ENCOUNTER — Other Ambulatory Visit: Payer: Self-pay

## 2014-01-02 MED ORDER — LISINOPRIL 40 MG PO TABS: 40.0000 mg | ORAL_TABLET | Freq: Every day | ORAL | Status: DC

## 2014-01-02 NOTE — Telephone Encounter (Signed)
Rx request for Lisinopril 40 mg. Rx sent to pharmacy for 6 months.

## 2014-01-03 ENCOUNTER — Telehealth: Payer: Self-pay | Admitting: Family

## 2014-01-03 NOTE — Telephone Encounter (Signed)
emmi mailed  °

## 2014-01-10 ENCOUNTER — Ambulatory Visit: Payer: Medicare Other | Admitting: Gastroenterology

## 2014-01-22 ENCOUNTER — Encounter: Payer: Self-pay | Admitting: Physician Assistant

## 2014-01-22 ENCOUNTER — Ambulatory Visit (INDEPENDENT_AMBULATORY_CARE_PROVIDER_SITE_OTHER): Payer: Medicare Other | Admitting: Physician Assistant

## 2014-01-22 VITALS — BP 125/70 | HR 84 | Temp 98.7°F | Resp 16 | Ht 67.0 in | Wt 207.2 lb

## 2014-01-22 DIAGNOSIS — B9689 Other specified bacterial agents as the cause of diseases classified elsewhere: Secondary | ICD-10-CM

## 2014-01-22 DIAGNOSIS — J208 Acute bronchitis due to other specified organisms: Principal | ICD-10-CM

## 2014-01-22 DIAGNOSIS — J00 Acute nasopharyngitis [common cold]: Secondary | ICD-10-CM | POA: Diagnosis not present

## 2014-01-22 MED ORDER — CEFDINIR 300 MG PO CAPS: 300.0000 mg | ORAL_CAPSULE | Freq: Two times a day (BID) | ORAL | Status: DC

## 2014-01-22 MED ORDER — BENZONATATE 100 MG PO CAPS: 100.0000 mg | ORAL_CAPSULE | Freq: Three times a day (TID) | ORAL | Status: DC | PRN

## 2014-01-22 NOTE — Assessment & Plan Note (Signed)
Will initiate antibiotic therapy.  Increase fluids.  Continue plain Mucinex. Humidifier in bedroom.  Tessalon for cough.  Call or return to clinic if symptoms are not improving.

## 2014-01-22 NOTE — Progress Notes (Signed)
Will start antibiotic therapy. Increase fluids.  Continue plain Mucinex. Rx Tessalon for cough.  Humidifier in bedroom.  Return precautions discussed with patient.  Past Medical History  Diagnosis Date  . ADENOCARCINOMA, PROSTATE 10/26/2007  . HYPERLIPIDEMIA 11/09/2006  . GERD 04/25/2007  . COLONIC POLYPS, HX OF 08/26/2006  . Epistaxis 12/2011    [facility]- records not available    Current Outpatient Prescriptions on File Prior to Visit  Medication Sig Dispense Refill  . amLODipine (NORVASC) 5 MG tablet Take 1 tablet (5 mg total) by mouth daily. 90 tablet 3  . Ascorbic Acid (VITAMIN C) 500 MG tablet Take 500 mg by mouth daily.      Marland Kitchen lisinopril (PRINIVIL,ZESTRIL) 40 MG tablet Take 1 tablet (40 mg total) by mouth daily. 90 tablet 1  . loratadine (CLARITIN) 10 MG tablet Take 10 mg by mouth daily as needed for allergies.     . meloxicam (MOBIC) 15 MG tablet   1  . multivitamin (THERAGRAN) per tablet Take 1 tablet by mouth daily.      Marland Kitchen omeprazole (PRILOSEC) 20 MG capsule Take 20 mg by mouth daily.    . potassium phosphate, monobasic, (K-PHOS ORIGINAL) 500 MG tablet Take 500 mg by mouth daily.     No current facility-administered medications on file prior to visit.    Allergies  Allergen Reactions  . Atorvastatin Other (See Comments)    Muscle cramps  . Erythromycin     REACTION: hives  . Levofloxacin     REACTION: joint swelling  . Penicillins   . Sulfonamide Derivatives     REACTION: swelling  . Tetracycline Hcl     REACTION: unspecified    Family History  Problem Relation Age of Onset  . Dementia Mother     History   Social History  . Marital Status: Married    Spouse Name: N/A    Number of Children: N/A  . Years of Education: N/A   Social History Main Topics  . Smoking status: Former Smoker    Types: Pipe  . Smokeless tobacco: None  . Alcohol Use: None  . Drug Use: None  . Sexual Activity: None   Other Topics Concern  . 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  All other ROS are negative.  BP 125/70 mmHg  Pulse 84  Temp(Src) 98.7 F (37.1 C) (Oral)  Resp 16  Ht 5\' 7"  (1.702 m)  Wt 207 lb 4 oz (94.008 kg)  BMI 32.45 kg/m2  SpO2 95%  Physical Exam  Constitutional: He is oriented to person, place, and time and well-developed, well-nourished, and in no distress.  HENT:  Head: Normocephalic and atraumatic.  Eyes: Conjunctivae are normal. Pupils are equal, round, and reactive to light.  Neck: Neck supple.  Cardiovascular: Normal rate, regular rhythm, normal heart sounds and intact distal pulses.   Pulmonary/Chest: Effort normal and breath sounds normal. No respiratory distress. He has no wheezes. He has no rales. He exhibits no tenderness.  Lymphadenopathy:    He has no cervical adenopathy.  Neurological: He is alert and oriented to person, place, and time.  Skin: Skin is warm and dry. No rash noted.  Psychiatric: Affect normal.  Vitals reviewed.   Recent Results (from the past 2160 hour(s))  TB Skin Test     Status: None   Collection Time: 12/18/13  3:00 PM  Result Value  Ref Range   TB Skin Test Negative    Induration 0 mm  Basic metabolic panel     Status: None   Collection Time: 12/27/13  2:57 PM  Result Value Ref Range   Sodium 137 135 - 145 mEq/L   Potassium 4.8 3.5 - 5.3 mEq/L   Chloride 99 96 - 112 mEq/L   CO2 30 19 - 32 mEq/L   Glucose, Bld 87 70 - 99 mg/dL   BUN 18 6 - 23 mg/dL   Creat 0.90 0.50 - 1.35 mg/dL   Calcium 9.9 8.4 - 10.5 mg/dL  Hepatic function panel     Status: None   Collection Time: 12/27/13  2:57 PM  Result Value Ref Range   Total Bilirubin 0.5 0.2 - 1.2 mg/dL   Bilirubin, Direct 0.1 0.0 - 0.3 mg/dL   Indirect Bilirubin 0.4 0.2 - 1.2 mg/dL   Alkaline Phosphatase 72 39 - 117 U/L   AST 31 0 - 37 U/L   ALT 37 0 - 53 U/L   Total Protein 7.0 6.0 - 8.3 g/dL   Albumin 4.3 3.5 - 5.2 g/dL  Hemoglobin  A1c     Status: Abnormal   Collection Time: 12/27/13  2:57 PM  Result Value Ref Range   Hgb A1c MFr Bld 5.8 (H) <5.7 %    Comment:                                                                        According to the ADA Clinical Practice Recommendations for 2011, when HbA1c is used as a screening test:     >=6.5%   Diagnostic of Diabetes Mellitus            (if abnormal result is confirmed)   5.7-6.4%   Increased risk of developing Diabetes Mellitus   References:Diagnosis and Classification of Diabetes Mellitus,Diabetes AGTX,6468,03(OZYYQ 1):S62-S69 and Standards of Medical Care in         Diabetes - 2011,Diabetes MGNO,0370,48 (Suppl 1):S11-S61.      Mean Plasma Glucose 120 (H) <117 mg/dL    Assessment/Plan: Acute bacterial bronchitis Will initiate antibiotic therapy.  Increase fluids.  Continue plain Mucinex. Humidifier in bedroom.  Tessalon for cough.  Call or return to clinic if symptoms are not improving.

## 2014-01-22 NOTE — Progress Notes (Signed)
Pre visit review using our clinic review tool, if applicable. No additional management support is needed unless otherwise documented below in the visit note/SLS  

## 2014-01-22 NOTE — Patient Instructions (Signed)
Please take antibiotic (Cefdinir) as directed.  Increase fluids.  Get plenty of rest.  Continue Mucinex as directed.  Place a humidifier in bedroom.  Use the Tessalon Perles for cough.  Call or return to clinic if symptoms are not improving.  Acute Bronchitis Bronchitis is inflammation of the airways that extend from the windpipe into the lungs (bronchi). The inflammation often causes mucus to develop. This leads to a cough, which is the most common symptom of bronchitis.  In acute bronchitis, the condition usually develops suddenly and goes away over time, usually in a couple weeks. Smoking, allergies, and asthma can make bronchitis worse. Repeated episodes of bronchitis may cause further lung problems.  CAUSES Acute bronchitis is most often caused by the same virus that causes a cold. The virus can spread from person to person (contagious) through coughing, sneezing, and touching contaminated objects. SIGNS AND SYMPTOMS   Cough.   Fever.   Coughing up mucus.   Body aches.   Chest congestion.   Chills.   Shortness of breath.   Sore throat.  DIAGNOSIS  Acute bronchitis is usually diagnosed through a physical exam. Your health care provider will also ask you questions about your medical history. Tests, such as chest X-rays, are sometimes done to rule out other conditions.  TREATMENT  Acute bronchitis usually goes away in a couple weeks. Oftentimes, no medical treatment is necessary. Medicines are sometimes given for relief of fever or cough. Antibiotic medicines are usually not needed but may be prescribed in certain situations. In some cases, an inhaler may be recommended to help reduce shortness of breath and control the cough. A cool mist vaporizer may also be used to help thin bronchial secretions and make it easier to clear the chest.  HOME CARE INSTRUCTIONS  Get plenty of rest.   Drink enough fluids to keep your urine clear or pale yellow (unless you have a medical  condition that requires fluid restriction). Increasing fluids may help thin your respiratory secretions (sputum) and reduce chest congestion, and it will prevent dehydration.   Take medicines only as directed by your health care provider.  If you were prescribed an antibiotic medicine, finish it all even if you start to feel better.  Avoid smoking and secondhand smoke. Exposure to cigarette smoke or irritating chemicals will make bronchitis worse. If you are a smoker, consider using nicotine gum or skin patches to help control withdrawal symptoms. Quitting smoking will help your lungs heal faster.   Reduce the chances of another bout of acute bronchitis by washing your hands frequently, avoiding people with cold symptoms, and trying not to touch your hands to your mouth, nose, or eyes.   Keep all follow-up visits as directed by your health care provider.  SEEK MEDICAL CARE IF: Your symptoms do not improve after 1 week of treatment.  SEEK IMMEDIATE MEDICAL CARE IF:  You develop an increased fever or chills.   You have chest pain.   You have severe shortness of breath.  You have bloody sputum.   You develop dehydration.  You faint or repeatedly feel like you are going to pass out.  You develop repeated vomiting.  You develop a severe headache. MAKE SURE YOU:   Understand these instructions.  Will watch your condition.  Will get help right away if you are not doing well or get worse. Document Released: 02/27/2004 Document Revised: 06/05/2013 Document Reviewed: 07/12/2012 [employer] Patient Information 2015 [place], [place]. This information is not intended to replace advice given  to you by your health care provider. Make sure you discuss any questions you have with your health care provider.  

## 2014-02-01 ENCOUNTER — Telehealth: Payer: Self-pay | Admitting: Family

## 2014-02-01 DIAGNOSIS — M25849 Other specified joint disorders, unspecified hand: Secondary | ICD-10-CM

## 2014-02-01 NOTE — Telephone Encounter (Signed)
Pt states he saw Padonda a few weeks ago regarding a growth on is knuckle on his left hand between his middle and index fingers.  Pt is calling to see if referral was sent to the hand center.  Advised pt no referral was sent.  Pt requesting referral to hand specialist for the growth on his hand as he is not unable to close his hand completely due to this.

## 2014-02-02 LAB — PSA

## 2014-02-05 NOTE — Telephone Encounter (Signed)
Hand specialist referral done

## 2014-02-12 DIAGNOSIS — C61 Malignant neoplasm of prostate: Secondary | ICD-10-CM | POA: Diagnosis not present

## 2014-02-12 DIAGNOSIS — N5231 Erectile dysfunction following radical prostatectomy: Secondary | ICD-10-CM | POA: Diagnosis not present

## 2014-02-13 DIAGNOSIS — D2112 Benign neoplasm of connective and other soft tissue of left upper limb, including shoulder: Secondary | ICD-10-CM | POA: Diagnosis not present

## 2014-02-28 ENCOUNTER — Telehealth: Payer: Self-pay | Admitting: Family

## 2014-02-28 ENCOUNTER — Other Ambulatory Visit: Payer: Self-pay | Admitting: Orthopedic Surgery

## 2014-02-28 DIAGNOSIS — D481 Neoplasm of uncertain behavior of connective and other soft tissue: Secondary | ICD-10-CM | POA: Diagnosis not present

## 2014-02-28 DIAGNOSIS — D2112 Benign neoplasm of connective and other soft tissue of left upper limb, including shoulder: Secondary | ICD-10-CM | POA: Diagnosis not present

## 2014-02-28 NOTE — Telephone Encounter (Signed)
Pt had flu shot at [facility]

## 2014-03-01 NOTE — Telephone Encounter (Signed)
Already documented.

## 2014-05-08 DIAGNOSIS — Z8546 Personal history of malignant neoplasm of prostate: Secondary | ICD-10-CM | POA: Diagnosis not present

## 2014-05-08 DIAGNOSIS — N528 Other male erectile dysfunction: Secondary | ICD-10-CM | POA: Diagnosis not present

## 2014-05-08 DIAGNOSIS — N393 Stress incontinence (female) (male): Secondary | ICD-10-CM | POA: Diagnosis not present

## 2014-05-23 ENCOUNTER — Other Ambulatory Visit: Payer: Self-pay | Admitting: Dermatology

## 2014-05-23 DIAGNOSIS — D485 Neoplasm of uncertain behavior of skin: Secondary | ICD-10-CM | POA: Diagnosis not present

## 2014-05-23 DIAGNOSIS — C44519 Basal cell carcinoma of skin of other part of trunk: Secondary | ICD-10-CM | POA: Diagnosis not present

## 2014-05-23 DIAGNOSIS — L821 Other seborrheic keratosis: Secondary | ICD-10-CM | POA: Diagnosis not present

## 2014-05-23 DIAGNOSIS — L570 Actinic keratosis: Secondary | ICD-10-CM | POA: Diagnosis not present

## 2014-05-23 DIAGNOSIS — Z85828 Personal history of other malignant neoplasm of skin: Secondary | ICD-10-CM | POA: Diagnosis not present

## 2014-06-04 ENCOUNTER — Other Ambulatory Visit: Payer: Self-pay

## 2014-06-04 MED ORDER — AMLODIPINE BESYLATE 5 MG PO TABS
5.0000 mg | ORAL_TABLET | Freq: Every day | ORAL | Status: DC
Start: 2014-06-04 — End: 2014-08-31

## 2014-06-25 ENCOUNTER — Encounter: Payer: Medicare Other | Admitting: Family

## 2014-07-03 ENCOUNTER — Telehealth: Payer: Self-pay | Admitting: Family

## 2014-07-03 MED ORDER — LISINOPRIL 40 MG PO TABS: 40.0000 mg | ORAL_TABLET | Freq: Every day | ORAL | Status: DC

## 2014-07-03 NOTE — Telephone Encounter (Signed)
Done

## 2014-07-03 NOTE — Telephone Encounter (Signed)
Patient is requesting a re-fill on lisinopril (PRINIVIL,ZESTRIL) 40 MG tablet sent to [facility] [name], [name].  He has a transfer appointment with Dr. Charlett Blake in July and need a re-fill to hold him until then.

## 2014-08-07 ENCOUNTER — Other Ambulatory Visit: Payer: Self-pay | Admitting: *Deleted

## 2014-08-07 ENCOUNTER — Telehealth: Payer: Self-pay | Admitting: Family

## 2014-08-07 MED ORDER — OMEPRAZOLE 20 MG PO CPDR: 20.0000 mg | DELAYED_RELEASE_CAPSULE | Freq: Every day | ORAL | Status: DC

## 2014-08-07 NOTE — Telephone Encounter (Signed)
Prescription sent in  

## 2014-08-07 NOTE — Telephone Encounter (Signed)
Pt requesting 30 day refill of omeprazole (PRILOSEC) 20 MG capsule sent to [facility] [address] in [place].  Pt has appt to establish with Dr. Charlett Blake on 7/21 but will run out of medication before then.

## 2014-08-22 ENCOUNTER — Telehealth: Payer: Self-pay | Admitting: *Deleted

## 2014-08-22 ENCOUNTER — Encounter: Payer: Self-pay | Admitting: *Deleted

## 2014-08-22 NOTE — Telephone Encounter (Signed)
Pre-Visit Call completed with patient and chart updated.   Pre-Visit Info documented in Specialty Comments under [employer].    

## 2014-08-23 ENCOUNTER — Ambulatory Visit (INDEPENDENT_AMBULATORY_CARE_PROVIDER_SITE_OTHER): Payer: Medicare Other | Admitting: Family Medicine

## 2014-08-23 ENCOUNTER — Encounter: Payer: Self-pay | Admitting: Family Medicine

## 2014-08-23 VITALS — BP 133/66 | HR 71 | Temp 97.8°F | Ht 67.0 in | Wt 210.0 lb

## 2014-08-23 DIAGNOSIS — K219 Gastro-esophageal reflux disease without esophagitis: Secondary | ICD-10-CM

## 2014-08-23 DIAGNOSIS — C61 Malignant neoplasm of prostate: Secondary | ICD-10-CM

## 2014-08-23 DIAGNOSIS — I10 Essential (primary) hypertension: Secondary | ICD-10-CM

## 2014-08-23 DIAGNOSIS — R739 Hyperglycemia, unspecified: Secondary | ICD-10-CM

## 2014-08-23 DIAGNOSIS — Z8619 Personal history of other infectious and parasitic diseases: Secondary | ICD-10-CM | POA: Insufficient documentation

## 2014-08-23 DIAGNOSIS — E785 Hyperlipidemia, unspecified: Secondary | ICD-10-CM

## 2014-08-23 NOTE — Patient Instructions (Addendum)
Probiotic such as Digestive Advantage or [place] daily  Preventive Care for Adults A healthy lifestyle and preventive care can promote health and wellness. Preventive health guidelines for men include the following key practices:  A routine yearly physical is a good way to check with your health care provider about your health and preventative screening. It is a chance to share any concerns and updates on your health and to receive a thorough exam.  Visit your dentist for a routine exam and preventative care every 6 months. Brush your teeth twice a day and floss once a day. Good oral hygiene prevents tooth decay and gum disease.  The frequency of eye exams is based on your age, health, family medical history, use of contact lenses, and other factors. Follow your health care provider's recommendations for frequency of eye exams.  Eat a healthy diet. Foods such as vegetables, fruits, whole grains, low-fat dairy products, and lean protein foods contain the nutrients you need without too many calories. Decrease your intake of foods high in solid fats, added sugars, and salt. Eat the right amount of calories for you.Get information about a proper diet from your health care provider, if necessary.  Regular physical exercise is one of the most important things you can do for your health. Most adults should get at least 150 minutes of moderate-intensity exercise (any activity that increases your heart rate and causes you to sweat) each week. In addition, most adults need muscle-strengthening exercises on 2 or more days a week.  Maintain a healthy weight. The body mass index (BMI) is a screening tool to identify possible weight problems. It provides an estimate of body fat based on height and weight. Your health care provider can find your BMI and can help you achieve or maintain a healthy weight.For adults 20 years and older:  A BMI below 18.5 is considered underweight.  A BMI of 18.5 to  24.9 is normal.  A BMI of 25 to 29.9 is considered overweight.  A BMI of 30 and above is considered obese.  Maintain normal blood lipids and cholesterol levels by exercising and minimizing your intake of saturated fat. Eat a balanced diet with plenty of fruit and vegetables. Blood tests for lipids and cholesterol should begin at age 22 and be repeated every 5 years. If your lipid or cholesterol levels are high, you are over 50, or you are at high risk for heart disease, you may need your cholesterol levels checked more frequently.Ongoing high lipid and cholesterol levels should be treated with medicines if diet and exercise are not working.  If you smoke, find out from your health care provider how to quit. If you do not use tobacco, do not start.  Lung cancer screening is recommended for adults aged 12-80 years who are at high risk for developing lung cancer because of a history of smoking. A yearly low-dose CT scan of the lungs is recommended for people who have at least a 30-pack-year history of smoking and are a current smoker or have quit within the past 15 years. A pack year of smoking is smoking an average of 1 pack of cigarettes a day for 1 year (for example: 1 pack a day for 30 years or 2 packs a day for 15 years). Yearly screening should continue until the smoker has stopped smoking for at least 15 years. Yearly screening should be stopped for people who develop a health problem that would prevent them from having lung cancer treatment.  If you choose to drink alcohol, do not have more than 2 drinks per day. One drink is considered to be 12 ounces (355 mL) of beer, 5 ounces (148 mL) of wine, or 1.5 ounces (44 mL) of liquor.  Avoid use of street drugs. Do not share needles with anyone. Ask for help if you need support or instructions about stopping the use of drugs.  High blood pressure causes heart disease and increases the risk of stroke. Your blood pressure should be checked at least  every 1-2 years. Ongoing high blood pressure should be treated with medicines, if weight loss and exercise are not effective.  If you are 23-49 years old, ask your health care provider if you should take aspirin to prevent heart disease.  Diabetes screening involves taking a blood sample to check your fasting blood sugar level. This should be done once every 3 years, after age 2, if you are within normal weight and without risk factors for diabetes. Testing should be considered at a younger age or be carried out more frequently if you are overweight and have at least 1 risk factor for diabetes.  Colorectal cancer can be detected and often prevented. Most routine colorectal cancer screening begins at the age of 28 and continues through age 31. However, your health care provider may recommend screening at an earlier age if you have risk factors for colon cancer. On a yearly basis, your health care provider may provide home test kits to check for hidden blood in the stool. Use of a small camera at the end of a tube to directly examine the colon (sigmoidoscopy or colonoscopy) can detect the earliest forms of colorectal cancer. Talk to your health care provider about this at age 91, when routine screening begins. Direct exam of the colon should be repeated every 5-10 years through age 24, unless early forms of precancerous polyps or small growths are found.  People who are at an increased risk for hepatitis B should be screened for this virus. You are considered at high risk for hepatitis B if:  You were born in a country where hepatitis B occurs often. Talk with your health care provider about which countries are considered high risk.  Your parents were born in a high-risk country and you have not received a shot to protect against hepatitis B (hepatitis B vaccine).  You have HIV or AIDS.  You use needles to inject street drugs.  You live with, or have sex with, someone who has hepatitis B.  You are  a man who has sex with other men (MSM).  You get hemodialysis treatment.  You take certain medicines for conditions such as cancer, organ transplantation, and autoimmune conditions.  Hepatitis C blood testing is recommended for all people born from [birth year] through 1965 and any individual with known risks for hepatitis C.  Practice safe sex. Use condoms and avoid high-risk sexual practices to reduce the spread of sexually transmitted infections (STIs). STIs include gonorrhea, chlamydia, syphilis, trichomonas, herpes, HPV, and human immunodeficiency virus (HIV). Herpes, HIV, and HPV are viral illnesses that have no cure. They can result in disability, cancer, and death.  If you are at risk of being infected with HIV, it is recommended that you take a prescription medicine daily to prevent HIV infection. This is called preexposure prophylaxis (PrEP). You are considered at risk if:  You are a man who has sex with other men (MSM) and have other risk factors.  You are a heterosexual man,  are sexually active, and are at increased risk for HIV infection.  You take drugs by injection.  You are sexually active with a partner who has HIV.  Talk with your health care provider about whether you are at high risk of being infected with HIV. If you choose to begin PrEP, you should first be tested for HIV. You should then be tested every 3 months for as long as you are taking PrEP.  A one-time screening for abdominal aortic aneurysm (AAA) and surgical repair of large AAAs by ultrasound are recommended for men ages 28 to 40 years who are current or former smokers.  Healthy men should no longer receive prostate-specific antigen (PSA) blood tests as part of routine cancer screening. Talk with your health care provider about prostate cancer screening.  Testicular cancer screening is not recommended for adult males who have no symptoms. Screening includes self-exam, a health care provider exam, and other screening  tests. Consult with your health care provider about any symptoms you have or any concerns you have about testicular cancer.  Use sunscreen. Apply sunscreen liberally and repeatedly throughout the day. You should seek shade when your shadow is shorter than you. Protect yourself by wearing long sleeves, pants, a wide-brimmed hat, and sunglasses year round, whenever you are outdoors.  Once a month, do a whole-body skin exam, using a mirror to look at the skin on your back. Tell your health care provider about new moles, moles that have irregular borders, moles that are larger than a pencil eraser, or moles that have changed in shape or color.  Stay current with required vaccines (immunizations).  Influenza vaccine. All adults should be immunized every year.  Tetanus, diphtheria, and acellular pertussis (Td, Tdap) vaccine. An adult who has not previously received Tdap or who does not know his vaccine status should receive 1 dose of Tdap. This initial dose should be followed by tetanus and diphtheria toxoids (Td) booster doses every 10 years. Adults with an unknown or incomplete history of completing a 3-dose immunization series with Td-containing vaccines should begin or complete a primary immunization series including a Tdap dose. Adults should receive a Td booster every 10 years.  Varicella vaccine. An adult without evidence of immunity to varicella should receive 2 doses or a second dose if he has previously received 1 dose.  Human papillomavirus (HPV) vaccine. Males aged 2-21 years who have not received the vaccine previously should receive the 3-dose series. Males aged 22-26 years may be immunized. Immunization is recommended through the age of 42 years for any male who has sex with males and did not get any or all doses earlier. Immunization is recommended for any person with an immunocompromised condition through the age of 1 years if he did not get any or all doses earlier. During the 3-dose  series, the second dose should be obtained 4-8 weeks after the first dose. The third dose should be obtained 24 weeks after the first dose and 16 weeks after the second dose.  Zoster vaccine. One dose is recommended for adults aged 59 years or older unless certain conditions are present.  Measles, mumps, and rubella (MMR) vaccine. Adults born before [birth year] generally are considered immune to measles and mumps. Adults born in [birth year] or later should have 1 or more doses of MMR vaccine unless there is a contraindication to the vaccine or there is laboratory evidence of immunity to each of the three diseases. A routine second dose of MMR vaccine should be obtained  at least 28 days after the first dose for students attending postsecondary schools, health care workers, or international travelers. People who received inactivated measles vaccine or an unknown type of measles vaccine during 1963-1967 should receive 2 doses of MMR vaccine. People who received inactivated mumps vaccine or an unknown type of mumps vaccine before 1979 and are at high risk for mumps infection should consider immunization with 2 doses of MMR vaccine. Unvaccinated health care workers born before [birth year] who lack laboratory evidence of measles, mumps, or rubella immunity or laboratory confirmation of disease should consider measles and mumps immunization with 2 doses of MMR vaccine or rubella immunization with 1 dose of MMR vaccine.  Pneumococcal 13-valent conjugate (PCV13) vaccine. When indicated, a person who is uncertain of his immunization history and has no record of immunization should receive the PCV13 vaccine. An adult aged 79 years or older who has certain medical conditions and has not been previously immunized should receive 1 dose of PCV13 vaccine. This PCV13 should be followed with a dose of pneumococcal polysaccharide (PPSV23) vaccine. The PPSV23 vaccine dose should be obtained at least 8 weeks after the dose of PCV13 vaccine. An adult  aged 42 years or older who has certain medical conditions and previously received 1 or more doses of PPSV23 vaccine should receive 1 dose of PCV13. The PCV13 vaccine dose should be obtained 1 or more years after the last PPSV23 vaccine dose.  Pneumococcal polysaccharide (PPSV23) vaccine. When PCV13 is also indicated, PCV13 should be obtained first. All adults aged 48 years and older should be immunized. An adult younger than age 80 years who has certain medical conditions should be immunized. Any person who resides in a nursing home or long-term care facility should be immunized. An adult smoker should be immunized. People with an immunocompromised condition and certain other conditions should receive both PCV13 and PPSV23 vaccines. People with human immunodeficiency virus (HIV) infection should be immunized as soon as possible after diagnosis. Immunization during chemotherapy or radiation therapy should be avoided. Routine use of PPSV23 vaccine is not recommended for American Indians, [place]atives, or people younger than 65 years unless there are medical conditions that require PPSV23 vaccine. When indicated, people who have unknown immunization and have no record of immunization should receive PPSV23 vaccine. One-time revaccination 5 years after the first dose of PPSV23 is recommended for people aged 19-64 years who have chronic kidney failure, nephrotic syndrome, asplenia, or immunocompromised conditions. People who received 1-2 doses of PPSV23 before age 76 years should receive another dose of PPSV23 vaccine at age 15 years or later if at least 5 years have passed since the previous dose. Doses of PPSV23 are not needed for people immunized with PPSV23 at or after age 69 years.  Meningococcal vaccine. Adults with asplenia or persistent complement component deficiencies should receive 2 doses of quadrivalent meningococcal conjugate (MenACWY-D) vaccine. The doses should be obtained at least 2 months apart.  Microbiologists working with certain meningococcal bacteria, [employer] recruits, people at risk during an outbreak, and people who travel to or live in countries with a high rate of meningitis should be immunized. A first-year college student up through age 11 years who is living in a residence hall should receive a dose if he did not receive a dose on or after his 16th birthday. Adults who have certain high-risk conditions should receive one or more doses of vaccine.  Hepatitis A vaccine. Adults who wish to be protected from this disease, have certain high-risk conditions,  work with hepatitis A-infected animals, work in hepatitis A research labs, or travel to or work in countries with a high rate of hepatitis A should be immunized. Adults who were previously unvaccinated and who anticipate close contact with an international adoptee during the first 60 days after arrival in the [place] from a country with a high rate of hepatitis A should be immunized.  Hepatitis B vaccine. Adults should be immunized if they wish to be protected from this disease, have certain high-risk conditions, may be exposed to blood or other infectious body fluids, are household contacts or sex partners of hepatitis B positive people, are clients or workers in certain care facilities, or travel to or work in countries with a high rate of hepatitis B.  Haemophilus influenzae type b (Hib) vaccine. A previously unvaccinated person with asplenia or sickle cell disease or having a scheduled splenectomy should receive 1 dose of Hib vaccine. Regardless of previous immunization, a recipient of a hematopoietic stem cell transplant should receive a 3-dose series 6-12 months after his successful transplant. Hib vaccine is not recommended for adults with HIV infection. Preventive Service / Frequency Ages 27 to 89  Blood pressure check.** / Every 1 to 2 years.  Lipid and cholesterol check.** / Every 5 years beginning at age  37.  Hepatitis C blood test.** / For any individual with known risks for hepatitis C.  Skin self-exam. / Monthly.  Influenza vaccine. / Every year.  Tetanus, diphtheria, and acellular pertussis (Tdap, Td) vaccine.** / Consult your health care provider. 1 dose of Td every 10 years.  Varicella vaccine.** / Consult your health care provider.  HPV vaccine. / 3 doses over 6 months, if 55 or younger.  Measles, mumps, rubella (MMR) vaccine.** / You need at least 1 dose of MMR if you were born in 1957 or later. You may also need a second dose.  Pneumococcal 13-valent conjugate (PCV13) vaccine.** / Consult your health care provider.  Pneumococcal polysaccharide (PPSV23) vaccine.** / 1 to 2 doses if you smoke cigarettes or if you have certain conditions.  Meningococcal vaccine.** / 1 dose if you are age 31 to 45 years and a Market researcher living in a residence hall, or have one of several medical conditions. You may also need additional booster doses.  Hepatitis A vaccine.** / Consult your health care provider.  Hepatitis B vaccine.** / Consult your health care provider.  Haemophilus influenzae type b (Hib) vaccine.** / Consult your health care provider. Ages 5 to 11  Blood pressure check.** / Every 1 to 2 years.  Lipid and cholesterol check.** / Every 5 years beginning at age 32.  Lung cancer screening. / Every year if you are aged 23-80 years and have a 30-pack-year history of smoking and currently smoke or have quit within the past 15 years. Yearly screening is stopped once you have quit smoking for at least 15 years or develop a health problem that would prevent you from having lung cancer treatment.  Fecal occult blood test (FOBT) of stool. / Every year beginning at age 100 and continuing until age 21. You may not have to do this test if you get a colonoscopy every 10 years.  Flexible sigmoidoscopy** or colonoscopy.** / Every 5 years for a flexible sigmoidoscopy or every  10 years for a colonoscopy beginning at age 29 and continuing until age 9.  Hepatitis C blood test.** / For all people born from [birth year] through 1965 and any individual with known risks for  hepatitis C.  Skin self-exam. / Monthly.  Influenza vaccine. / Every year.  Tetanus, diphtheria, and acellular pertussis (Tdap/Td) vaccine.** / Consult your health care provider. 1 dose of Td every 10 years.  Varicella vaccine.** / Consult your health care provider.  Zoster vaccine.** / 1 dose for adults aged 64 years or older.  Measles, mumps, rubella (MMR) vaccine.** / You need at least 1 dose of MMR if you were born in 1955 or later. You may also need a second dose.  Pneumococcal 13-valent conjugate (PCV13) vaccine.** / Consult your health care provider.  Pneumococcal polysaccharide (PPSV23) vaccine.** / 1 to 2 doses if you smoke cigarettes or if you have certain conditions.  Meningococcal vaccine.** / Consult your health care provider.  Hepatitis A vaccine.** / Consult your health care provider.  Hepatitis B vaccine.** / Consult your health care provider.  Haemophilus influenzae type b (Hib) vaccine.** / Consult your health care provider. Ages 58 and over  Blood pressure check.** / Every 1 to 2 years.  Lipid and cholesterol check.**/ Every 5 years beginning at age 21.  Lung cancer screening. / Every year if you are aged 49-80 years and have a 30-pack-year history of smoking and currently smoke or have quit within the past 15 years. Yearly screening is stopped once you have quit smoking for at least 15 years or develop a health problem that would prevent you from having lung cancer treatment.  Fecal occult blood test (FOBT) of stool. / Every year beginning at age 76 and continuing until age 92. You may not have to do this test if you get a colonoscopy every 10 years.  Flexible sigmoidoscopy** or colonoscopy.** / Every 5 years for a flexible sigmoidoscopy or every 10 years for a colonoscopy  beginning at age 65 and continuing until age 93.  Hepatitis C blood test.** / For all people born from [birth year] through 1963 and any individual with known risks for hepatitis C.  Abdominal aortic aneurysm (AAA) screening.** / A one-time screening for ages 82 to 60 years who are current or former smokers.  Skin self-exam. / Monthly.  Influenza vaccine. / Every year.  Tetanus, diphtheria, and acellular pertussis (Tdap/Td) vaccine.** / 1 dose of Td every 10 years.  Varicella vaccine.** / Consult your health care provider.  Zoster vaccine.** / 1 dose for adults aged 81 years or older.  Pneumococcal 13-valent conjugate (PCV13) vaccine.** / Consult your health care provider.  Pneumococcal polysaccharide (PPSV23) vaccine.** / 1 dose for all adults aged 37 years and older.  Meningococcal vaccine.** / Consult your health care provider.  Hepatitis A vaccine.** / Consult your health care provider.  Hepatitis B vaccine.** / Consult your health care provider.  Haemophilus influenzae type b (Hib) vaccine.** / Consult your health care provider. **Family history and personal history of risk and conditions may change your health care provider's recommendations. Document Released: 03/17/2001 Document Revised: 01/24/2013 Document Reviewed: 06/16/2010 [facility] Patient Information 2015 [place], [place]. This information is not intended to replace advice given to you by your health care provider. Make sure you discuss any questions you have with your health care provider.

## 2014-08-23 NOTE — Progress Notes (Signed)
Matthew [name]  [id number] 05/24/1935 08/23/2014      Progress Note-Follow Up  Subjective  Chief Complaint  Chief Complaint  Patient presents with  . Transfer of Care    HPI  Patient is a 79 y.o. male in today for routine medical care. Patient is in today to  Establish care. He has a past medical h hyperlipidemia hypertension prostate cancer, reflux, colonic polyps. He is in his usual state of health and feels fairly well. No recent illness. Denies CP/palp/SOB/HA/congestion/fevers/GI or GU c/o. Taking meds as prescribed  Past Medical History  Diagnosis Date  . ADENOCARCINOMA, PROSTATE 10/26/2007  . HYPERLIPIDEMIA 11/09/2006  . GERD 04/25/2007  . COLONIC POLYPS, HX OF 08/26/2006  . Epistaxis 12/2011    [facility] ED- records not available    Past Surgical History  Procedure Laterality Date  . Tonsillectomy and adenoidectomy    . Prostatectomy  2009    Family History  Problem Relation Age of Onset  . Dementia Mother     History   Social History  . Marital Status: Married    Spouse Name: N/A  . Number of Children: N/A  . Years of Education: N/A   Occupational History  . Not on file.   Social History Main Topics  . Smoking status: Former Smoker    Types: Pipe  . Smokeless tobacco: Not on file  . Alcohol Use: Not on file  . Drug Use: Not on file  . Sexual Activity: Not on file   Other Topics Concern  . 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 amLODipine (NORVASC) 5 MG tablet Take 1 tablet (5 mg total) by mouth daily. 90 tablet 0  . Ascorbic Acid (VITAMIN C) 500 MG tablet Take 500 mg by mouth daily.      Marland Kitchen lisinopril (PRINIVIL,ZESTRIL) 40 MG tablet Take 1 tablet (40 mg total) by mouth daily. 90 tablet 0  . loratadine (CLARITIN) 10 MG tablet Take 10 mg by mouth daily as needed for allergies.     .  multivitamin (THERAGRAN) per tablet Take 1 tablet by mouth daily.      Marland Kitchen omeprazole (PRILOSEC) 20 MG capsule Take 1 capsule (20 mg total) by mouth daily. 90 capsule 0  . potassium phosphate, monobasic, (K-PHOS ORIGINAL) 500 MG tablet Take 500 mg by mouth daily.     No current facility-administered medications on file prior to visit.    Allergies  Allergen Reactions  . Atorvastatin Other (See Comments)    Muscle cramps  . Erythromycin     REACTION: hives  . Levofloxacin     REACTION: joint swelling  . Penicillins   . Sulfonamide Derivatives     REACTION: swelling  . Tetracycline Hcl     REACTION: unspecified    Review of Systems  Review of Systems  Constitutional: Negative for fever, chills and malaise/fatigue.  HENT: Negative for congestion, hearing loss and nosebleeds.   Eyes: Negative for discharge.  Respiratory: Negative for cough, sputum production, shortness of breath and wheezing.   Cardiovascular: Negative for chest pain, palpitations and leg swelling.  Gastrointestinal: Negative for heartburn, nausea, vomiting, abdominal pain, diarrhea, constipation and blood in stool.  Genitourinary: Negative for dysuria, urgency, frequency and hematuria.  Musculoskeletal: Negative for myalgias, back pain and falls.  Skin: Negative for rash.  Neurological: Negative for  dizziness, tremors, sensory change, focal weakness, loss of consciousness, weakness and headaches.  Endo/Heme/Allergies: Negative for polydipsia. Does not bruise/bleed easily.  Psychiatric/Behavioral: Negative for depression and suicidal ideas. The patient is not nervous/anxious and does not have insomnia.     Objective  BP 133/66 mmHg  Pulse 71  Temp(Src) 97.8 F (36.6 C) (Oral)  Ht 5\' 7"  (1.702 m)  Wt 210 lb (95.255 kg)  BMI 32.88 kg/m2  SpO2 95%  Physical Exam  Physical Exam  Constitutional: He is oriented to person, place, and time and well-developed, well-nourished, and in no distress. No distress.    HENT:  Head: Normocephalic and atraumatic.  Eyes: Conjunctivae are normal.  Neck: Neck supple. No thyromegaly present.  Cardiovascular: Normal rate, regular rhythm and normal heart sounds.   No murmur heard. Pulmonary/Chest: Effort normal and breath sounds normal. No respiratory distress.  Abdominal: He exhibits no distension and no mass. There is no tenderness.  Musculoskeletal: He exhibits no edema.  Neurological: He is alert and oriented to person, place, and time.  Skin: Skin is warm.  Psychiatric: Memory, affect and judgment normal.    Lab Results  Component Value Date   TSH 1.23 10/24/2012   Lab Results  Component Value Date   WBC 9.5 12/22/2011   HGB 13.6 12/22/2011   HCT 40.4 12/22/2011   MCV 87.8 12/22/2011   PLT 218.0 12/22/2011   Lab Results  Component Value Date   CREATININE 0.90 12/27/2013   BUN 18 12/27/2013   NA 137 12/27/2013   K 4.8 12/27/2013   CL 99 12/27/2013   CO2 30 12/27/2013   Lab Results  Component Value Date   ALT 37 12/27/2013   AST 31 12/27/2013   ALKPHOS 72 12/27/2013   BILITOT 0.5 12/27/2013   Lab Results  Component Value Date   CHOL 288* 10/24/2012   Lab Results  Component Value Date   HDL 40.40 10/24/2012   Lab Results  Component Value Date   LDLCALC 128* 12/22/2011   Lab Results  Component Value Date   TRIG 117.0 10/24/2012   Lab Results  Component Value Date   CHOLHDL 7 10/24/2012     Assessment & Plan  HTN (hypertension) Well controlled, no changes to meds. Encouraged heart healthy diet such as the DASH diet and exercise as tolerated  Hyperlipidemia, mild Encouraged heart healthy diet, increase exercise, avoid trans fats, consider a krill oil cap daily  Hyperglycemia hgba1c acceptable, minimize simple carbs. Increase exercise as tolerated.  GERD Avoid offending foods, start probiotics. Do not eat large meals in late evening and consider raising head of bed. Continue Omeprazole prn

## 2014-08-23 NOTE — Progress Notes (Signed)
Pre visit review using our clinic review tool, if applicable. No additional management support is needed unless otherwise documented below in the visit note. 

## 2014-08-23 NOTE — Assessment & Plan Note (Signed)
Well controlled, no changes to meds. Encouraged heart healthy diet such as the DASH diet and exercise as tolerated.  °

## 2014-08-24 DIAGNOSIS — C61 Malignant neoplasm of prostate: Secondary | ICD-10-CM | POA: Diagnosis not present

## 2014-08-24 DIAGNOSIS — N5231 Erectile dysfunction following radical prostatectomy: Secondary | ICD-10-CM | POA: Diagnosis not present

## 2014-08-24 DIAGNOSIS — Z8546 Personal history of malignant neoplasm of prostate: Secondary | ICD-10-CM | POA: Diagnosis not present

## 2014-08-31 ENCOUNTER — Telehealth: Payer: Self-pay | Admitting: *Deleted

## 2014-08-31 MED ORDER — AMLODIPINE BESYLATE 5 MG PO TABS: 5.0000 mg | ORAL_TABLET | Freq: Every day | ORAL | Status: DC

## 2014-08-31 MED ORDER — LISINOPRIL 40 MG PO TABS: 40.0000 mg | ORAL_TABLET | Freq: Every day | ORAL | Status: DC

## 2014-08-31 MED ORDER — OMEPRAZOLE 20 MG PO CPDR: 20.0000 mg | DELAYED_RELEASE_CAPSULE | Freq: Every day | ORAL | Status: DC

## 2014-08-31 NOTE — Telephone Encounter (Signed)
Rx sent to [facility]- requested at [place] 7/21.

## 2014-09-08 NOTE — Assessment & Plan Note (Signed)
Encouraged heart healthy diet, increase exercise, avoid trans fats, consider a krill oil cap daily 

## 2014-09-08 NOTE — Assessment & Plan Note (Signed)
hgba1c acceptable, minimize simple carbs. Increase exercise as tolerated.  

## 2014-09-08 NOTE — Assessment & Plan Note (Signed)
Avoid offending foods, start probiotics. Do not eat large meals in late evening and consider raising head of bed. Continue Omeprazole prn

## 2014-11-19 DIAGNOSIS — L570 Actinic keratosis: Secondary | ICD-10-CM | POA: Diagnosis not present

## 2014-11-19 DIAGNOSIS — D044 Carcinoma in situ of skin of scalp and neck: Secondary | ICD-10-CM | POA: Diagnosis not present

## 2014-11-19 DIAGNOSIS — D1801 Hemangioma of skin and subcutaneous tissue: Secondary | ICD-10-CM | POA: Diagnosis not present

## 2014-11-19 DIAGNOSIS — L821 Other seborrheic keratosis: Secondary | ICD-10-CM | POA: Diagnosis not present

## 2014-11-19 DIAGNOSIS — L218 Other seborrheic dermatitis: Secondary | ICD-10-CM | POA: Diagnosis not present

## 2014-11-19 DIAGNOSIS — Z85828 Personal history of other malignant neoplasm of skin: Secondary | ICD-10-CM | POA: Diagnosis not present

## 2014-12-12 DIAGNOSIS — M9902 Segmental and somatic dysfunction of thoracic region: Secondary | ICD-10-CM | POA: Diagnosis not present

## 2014-12-12 DIAGNOSIS — M5114 Intervertebral disc disorders with radiculopathy, thoracic region: Secondary | ICD-10-CM | POA: Diagnosis not present

## 2014-12-17 DIAGNOSIS — M9902 Segmental and somatic dysfunction of thoracic region: Secondary | ICD-10-CM | POA: Diagnosis not present

## 2014-12-17 DIAGNOSIS — M5114 Intervertebral disc disorders with radiculopathy, thoracic region: Secondary | ICD-10-CM | POA: Diagnosis not present

## 2014-12-19 DIAGNOSIS — M5114 Intervertebral disc disorders with radiculopathy, thoracic region: Secondary | ICD-10-CM | POA: Diagnosis not present

## 2014-12-19 DIAGNOSIS — M9902 Segmental and somatic dysfunction of thoracic region: Secondary | ICD-10-CM | POA: Diagnosis not present

## 2014-12-20 DIAGNOSIS — M9902 Segmental and somatic dysfunction of thoracic region: Secondary | ICD-10-CM | POA: Diagnosis not present

## 2014-12-20 DIAGNOSIS — M5114 Intervertebral disc disorders with radiculopathy, thoracic region: Secondary | ICD-10-CM | POA: Diagnosis not present

## 2014-12-24 DIAGNOSIS — M9902 Segmental and somatic dysfunction of thoracic region: Secondary | ICD-10-CM | POA: Diagnosis not present

## 2014-12-24 DIAGNOSIS — M5114 Intervertebral disc disorders with radiculopathy, thoracic region: Secondary | ICD-10-CM | POA: Diagnosis not present

## 2014-12-26 DIAGNOSIS — M9902 Segmental and somatic dysfunction of thoracic region: Secondary | ICD-10-CM | POA: Diagnosis not present

## 2014-12-26 DIAGNOSIS — M5114 Intervertebral disc disorders with radiculopathy, thoracic region: Secondary | ICD-10-CM | POA: Diagnosis not present

## 2014-12-31 DIAGNOSIS — M5114 Intervertebral disc disorders with radiculopathy, thoracic region: Secondary | ICD-10-CM | POA: Diagnosis not present

## 2014-12-31 DIAGNOSIS — M9902 Segmental and somatic dysfunction of thoracic region: Secondary | ICD-10-CM | POA: Diagnosis not present

## 2015-01-02 DIAGNOSIS — M9902 Segmental and somatic dysfunction of thoracic region: Secondary | ICD-10-CM | POA: Diagnosis not present

## 2015-01-02 DIAGNOSIS — M5114 Intervertebral disc disorders with radiculopathy, thoracic region: Secondary | ICD-10-CM | POA: Diagnosis not present

## 2015-01-07 DIAGNOSIS — M5114 Intervertebral disc disorders with radiculopathy, thoracic region: Secondary | ICD-10-CM | POA: Diagnosis not present

## 2015-01-07 DIAGNOSIS — M9902 Segmental and somatic dysfunction of thoracic region: Secondary | ICD-10-CM | POA: Diagnosis not present

## 2015-01-09 DIAGNOSIS — M9902 Segmental and somatic dysfunction of thoracic region: Secondary | ICD-10-CM | POA: Diagnosis not present

## 2015-01-09 DIAGNOSIS — M5114 Intervertebral disc disorders with radiculopathy, thoracic region: Secondary | ICD-10-CM | POA: Diagnosis not present

## 2015-01-14 DIAGNOSIS — M5114 Intervertebral disc disorders with radiculopathy, thoracic region: Secondary | ICD-10-CM | POA: Diagnosis not present

## 2015-01-14 DIAGNOSIS — M9902 Segmental and somatic dysfunction of thoracic region: Secondary | ICD-10-CM | POA: Diagnosis not present

## 2015-01-17 DIAGNOSIS — M5114 Intervertebral disc disorders with radiculopathy, thoracic region: Secondary | ICD-10-CM | POA: Diagnosis not present

## 2015-01-17 DIAGNOSIS — M9902 Segmental and somatic dysfunction of thoracic region: Secondary | ICD-10-CM | POA: Diagnosis not present

## 2015-01-21 DIAGNOSIS — M9902 Segmental and somatic dysfunction of thoracic region: Secondary | ICD-10-CM | POA: Diagnosis not present

## 2015-01-21 DIAGNOSIS — M5114 Intervertebral disc disorders with radiculopathy, thoracic region: Secondary | ICD-10-CM | POA: Diagnosis not present

## 2015-02-18 DIAGNOSIS — M9902 Segmental and somatic dysfunction of thoracic region: Secondary | ICD-10-CM | POA: Diagnosis not present

## 2015-02-18 DIAGNOSIS — M5114 Intervertebral disc disorders with radiculopathy, thoracic region: Secondary | ICD-10-CM | POA: Diagnosis not present

## 2015-03-07 DIAGNOSIS — H2513 Age-related nuclear cataract, bilateral: Secondary | ICD-10-CM | POA: Diagnosis not present

## 2015-03-11 ENCOUNTER — Encounter: Payer: Self-pay | Admitting: Behavioral Health

## 2015-03-11 ENCOUNTER — Telehealth: Payer: Self-pay | Admitting: Behavioral Health

## 2015-03-11 NOTE — Telephone Encounter (Signed)
Pre-Visit Call completed with patient and chart updated.   Pre-Visit Info documented in Specialty Comments under [employer].    

## 2015-03-12 ENCOUNTER — Encounter: Payer: Self-pay | Admitting: Family Medicine

## 2015-03-12 ENCOUNTER — Ambulatory Visit (INDEPENDENT_AMBULATORY_CARE_PROVIDER_SITE_OTHER): Payer: Medicare Other | Admitting: Family Medicine

## 2015-03-12 VITALS — BP 138/82 | HR 78 | Temp 97.7°F | Ht 67.0 in | Wt 212.4 lb

## 2015-03-12 DIAGNOSIS — Z0000 Encounter for general adult medical examination without abnormal findings: Secondary | ICD-10-CM | POA: Diagnosis not present

## 2015-03-12 DIAGNOSIS — R739 Hyperglycemia, unspecified: Secondary | ICD-10-CM

## 2015-03-12 DIAGNOSIS — C4490 Unspecified malignant neoplasm of skin, unspecified: Secondary | ICD-10-CM

## 2015-03-12 DIAGNOSIS — I10 Essential (primary) hypertension: Secondary | ICD-10-CM

## 2015-03-12 DIAGNOSIS — Z8781 Personal history of (healed) traumatic fracture: Secondary | ICD-10-CM | POA: Insufficient documentation

## 2015-03-12 DIAGNOSIS — E785 Hyperlipidemia, unspecified: Secondary | ICD-10-CM

## 2015-03-12 HISTORY — DX: Encounter for general adult medical examination without abnormal findings: Z00.00

## 2015-03-12 LAB — COMPREHENSIVE METABOLIC PANEL
ALBUMIN: 4 g/dL (ref 3.5–5.2)
ALK PHOS: 65 U/L (ref 39–117)
ALT: 17 U/L (ref 0–53)
AST: 17 U/L (ref 0–37)
BUN: 13 mg/dL (ref 6–23)
CO2: 30 mEq/L (ref 19–32)
Calcium: 9.9 mg/dL (ref 8.4–10.5)
Chloride: 103 mEq/L (ref 96–112)
Creatinine, Ser: 0.76 mg/dL (ref 0.40–1.50)
GFR: 105.54 mL/min (ref >60.00)
Glucose, Bld: 122 mg/dL — ABNORMAL HIGH (ref 70–99)
POTASSIUM: 4.1 meq/L (ref 3.5–5.1)
SODIUM: 137 meq/L (ref 135–145)
TOTAL PROTEIN: 7.1 g/dL (ref 6.0–8.3)
Total Bilirubin: 0.4 mg/dL (ref 0.2–1.2)

## 2015-03-12 LAB — MICROALBUMIN / CREATININE URINE RATIO
CREATININE, U: 109.3 mg/dL
MICROALB UR: 0.8 mg/dL (ref 0.0–1.9)
Microalb Creat Ratio: 0.7 mg/g (ref 0.0–30.0)

## 2015-03-12 LAB — CBC
HEMATOCRIT: 44.2 % (ref 39.0–52.0)
Hemoglobin: 14.6 g/dL (ref 13.0–17.0)
MCHC: 33.1 g/dL (ref 30.0–36.0)
MCV: 89.8 fl (ref 78.0–100.0)
PLATELETS: 190 10*3/uL (ref 150.0–400.0)
RBC: 4.93 Mil/uL (ref 4.22–5.81)
RDW: 13.9 % (ref 11.5–15.5)
WBC: 7.9 10*3/uL (ref 4.0–10.5)

## 2015-03-12 LAB — LIPID PANEL
CHOL/HDL RATIO: 6
CHOLESTEROL: 240 mg/dL — AB (ref 0–200)
HDL: 39.4 mg/dL (ref >39.00)
LDL CALC: 161 mg/dL — AB (ref 0–99)
NonHDL: 200.71
Triglycerides: 199 mg/dL — ABNORMAL HIGH (ref 0.0–149.0)
VLDL: 39.8 mg/dL (ref 0.0–40.0)

## 2015-03-12 LAB — TSH: TSH: 1.46 u[IU]/mL (ref 0.35–4.50)

## 2015-03-12 LAB — HEMOGLOBIN A1C: Hgb A1c MFr Bld: 6.1 % (ref 4.6–6.5)

## 2015-03-12 MED ORDER — AMLODIPINE BESYLATE 5 MG PO TABS: 5.0000 mg | ORAL_TABLET | Freq: Every day | ORAL | Status: DC

## 2015-03-12 MED ORDER — LISINOPRIL 40 MG PO TABS: 40.0000 mg | ORAL_TABLET | Freq: Every day | ORAL | Status: DC

## 2015-03-12 MED ORDER — OMEPRAZOLE 20 MG PO CPDR: 20.0000 mg | DELAYED_RELEASE_CAPSULE | Freq: Every day | ORAL | Status: DC

## 2015-03-12 MED ORDER — LOSARTAN POTASSIUM 25 MG PO TABS: 25.0000 mg | ORAL_TABLET | Freq: Every day | ORAL | Status: DC

## 2015-03-12 NOTE — Assessment & Plan Note (Signed)
Avoid offending foods, start probiotics. Do not eat large meals in late evening and consider raising head of bed.  

## 2015-03-12 NOTE — Progress Notes (Signed)
Pre visit review using our clinic review tool, if applicable. No additional management support is needed unless otherwise documented below in the visit note. 
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
## 2015-03-12 NOTE — Patient Instructions (Addendum)
Forward Copy's of advanced Directives to [facility] for Adults, Male  A healthy lifestyle and preventive care can promote health and wellness. Preventive health guidelines for men include the following key practices:  A routine yearly physical is a good way to check with your health care provider about your health and preventative screening. It is a chance to share any concerns and updates on your health and to receive a thorough exam.  Visit your dentist for a routine exam and preventative care every 6 months. Brush your teeth twice a day and floss once a day. Good oral hygiene prevents tooth decay and gum disease.  The frequency of eye exams is based on your age, health, family medical history, use of contact lenses, and other factors. Follow your health care provider's recommendations for frequency of eye exams.  Eat a healthy diet. Foods such as vegetables, fruits, whole grains, low-fat dairy products, and lean protein foods contain the nutrients you need without too many calories. Decrease your intake of foods high in solid fats, added sugars, and salt. Eat the right amount of calories for you.Get information about a proper diet from your health care provider, if necessary.  Regular physical exercise is one of the most important things you can do for your health. Most adults should get at least 150 minutes of moderate-intensity exercise (any activity that increases your heart rate and causes you to sweat) each week. In addition, most adults need muscle-strengthening exercises on 2 or more days a week.  Maintain a healthy weight. The body mass index (BMI) is a screening tool to identify possible weight problems. It provides an estimate of body fat based on height and weight. Your health care provider can find your BMI and can help you achieve or maintain a healthy weight.For adults 20 years and older:  A BMI below 18.5 is considered underweight.  A BMI of 18.5 to 24.9 is  normal.  A BMI of 25 to 29.9 is considered overweight.  A BMI of 30 and above is considered obese.  Maintain normal blood lipids and cholesterol levels by exercising and minimizing your intake of saturated fat. Eat a balanced diet with plenty of fruit and vegetables. Blood tests for lipids and cholesterol should begin at age 39 and be repeated every 5 years. If your lipid or cholesterol levels are high, you are over 50, or you are at high risk for heart disease, you may need your cholesterol levels checked more frequently.Ongoing high lipid and cholesterol levels should be treated with medicines if diet and exercise are not working.  If you smoke, find out from your health care provider how to quit. If you do not use tobacco, do not start.  Lung cancer screening is recommended for adults aged 75-80 years who are at high risk for developing lung cancer because of a history of smoking. A yearly low-dose CT scan of the lungs is recommended for people who have at least a 30-pack-year history of smoking and are a current smoker or have quit within the past 15 years. A pack year of smoking is smoking an average of 1 pack of cigarettes a day for 1 year (for example: 1 pack a day for 30 years or 2 packs a day for 15 years). Yearly screening should continue until the smoker has stopped smoking for at least 15 years. Yearly screening should be stopped for people who develop a health problem that would prevent them from having lung cancer treatment.  If you choose to drink alcohol, do not have more than 2 drinks per day. One drink is considered to be 12 ounces (355 mL) of beer, 5 ounces (148 mL) of wine, or 1.5 ounces (44 mL) of liquor.  Avoid use of street drugs. Do not share needles with anyone. Ask for help if you need support or instructions about stopping the use of drugs.  High blood pressure causes heart disease and increases the risk of stroke. Your blood pressure should be checked at least every 1-2  years. Ongoing high blood pressure should be treated with medicines, if weight loss and exercise are not effective.  If you are 65-37 years old, ask your health care provider if you should take aspirin to prevent heart disease.  Diabetes screening is done by taking a blood sample to check your blood glucose level after you have not eaten for a certain period of time (fasting). If you are not overweight and you do not have risk factors for diabetes, you should be screened once every 3 years starting at age 87. If you are overweight or obese and you are 63-78 years of age, you should be screened for diabetes every year as part of your cardiovascular risk assessment.  Colorectal cancer can be detected and often prevented. Most routine colorectal cancer screening begins at the age of 63 and continues through age 55. However, your health care provider may recommend screening at an earlier age if you have risk factors for colon cancer. On a yearly basis, your health care provider may provide home test kits to check for hidden blood in the stool. Use of a small camera at the end of a tube to directly examine the colon (sigmoidoscopy or colonoscopy) can detect the earliest forms of colorectal cancer. Talk to your health care provider about this at age 71, when routine screening begins. Direct exam of the colon should be repeated every 5-10 years through age 2, unless early forms of precancerous polyps or small growths are found.  People who are at an increased risk for hepatitis B should be screened for this virus. You are considered at high risk for hepatitis B if:  You were born in a country where hepatitis B occurs often. Talk with your health care provider about which countries are considered high risk.  Your parents were born in a high-risk country and you have not received a shot to protect against hepatitis B (hepatitis B vaccine).  You have HIV or AIDS.  You use needles to inject street  drugs.  You live with, or have sex with, someone who has hepatitis B.  You are a man who has sex with other men (MSM).  You get hemodialysis treatment.  You take certain medicines for conditions such as cancer, organ transplantation, and autoimmune conditions.  Hepatitis C blood testing is recommended for all people born from [birth year] through 1965 and any individual with known risks for hepatitis C.  Practice safe sex. Use condoms and avoid high-risk sexual practices to reduce the spread of sexually transmitted infections (STIs). STIs include gonorrhea, chlamydia, syphilis, trichomonas, herpes, HPV, and human immunodeficiency virus (HIV). Herpes, HIV, and HPV are viral illnesses that have no cure. They can result in disability, cancer, and death.  If you are a man who has sex with other men, you should be screened at least once per year for:  HIV.  Urethral, rectal, and pharyngeal infection of gonorrhea, chlamydia, or both.  If you are at risk of being  infected with HIV, it is recommended that you take a prescription medicine daily to prevent HIV infection. This is called preexposure prophylaxis (PrEP). You are considered at risk if:  You are a man who has sex with other men (MSM) and have other risk factors.  You are a heterosexual man, are sexually active, and are at increased risk for HIV infection.  You take drugs by injection.  You are sexually active with a partner who has HIV.  Talk with your health care provider about whether you are at high risk of being infected with HIV. If you choose to begin PrEP, you should first be tested for HIV. You should then be tested every 3 months for as long as you are taking PrEP.  A one-time screening for abdominal aortic aneurysm (AAA) and surgical repair of large AAAs by ultrasound are recommended for men ages 84 to 3 years who are current or former smokers.  Healthy men should no longer receive prostate-specific antigen (PSA) blood tests as  part of routine cancer screening. Talk with your health care provider about prostate cancer screening.  Testicular cancer screening is not recommended for adult males who have no symptoms. Screening includes self-exam, a health care provider exam, and other screening tests. Consult with your health care provider about any symptoms you have or any concerns you have about testicular cancer.  Use sunscreen. Apply sunscreen liberally and repeatedly throughout the day. You should seek shade when your shadow is shorter than you. Protect yourself by wearing long sleeves, pants, a wide-brimmed hat, and sunglasses year round, whenever you are outdoors.  Once a month, do a whole-body skin exam, using a mirror to look at the skin on your back. Tell your health care provider about new moles, moles that have irregular borders, moles that are larger than a pencil eraser, or moles that have changed in shape or color.  Stay current with required vaccines (immunizations).  Influenza vaccine. All adults should be immunized every year.  Tetanus, diphtheria, and acellular pertussis (Td, Tdap) vaccine. An adult who has not previously received Tdap or who does not know his vaccine status should receive 1 dose of Tdap. This initial dose should be followed by tetanus and diphtheria toxoids (Td) booster doses every 10 years. Adults with an unknown or incomplete history of completing a 3-dose immunization series with Td-containing vaccines should begin or complete a primary immunization series including a Tdap dose. Adults should receive a Td booster every 10 years.  Varicella vaccine. An adult without evidence of immunity to varicella should receive 2 doses or a second dose if he has previously received 1 dose.  Human papillomavirus (HPV) vaccine. Males aged 11-21 years who have not received the vaccine previously should receive the 3-dose series. Males aged 22-26 years may be immunized. Immunization is recommended through  the age of 55 years for any male who has sex with males and did not get any or all doses earlier. Immunization is recommended for any person with an immunocompromised condition through the age of 73 years if he did not get any or all doses earlier. During the 3-dose series, the second dose should be obtained 4-8 weeks after the first dose. The third dose should be obtained 24 weeks after the first dose and 16 weeks after the second dose.  Zoster vaccine. One dose is recommended for adults aged 79 years or older unless certain conditions are present.  Measles, mumps, and rubella (MMR) vaccine. Adults born before 50 generally are  considered immune to measles and mumps. Adults born in 1957 or later should have 1 or more doses of MMR vaccine unless there is a contraindication to the vaccine or there is laboratory evidence of immunity to each of the three diseases. A routine second dose of MMR vaccine should be obtained at least 28 days after the first dose for students attending postsecondary schools, health care workers, or international travelers. People who received inactivated measles vaccine or an unknown type of measles vaccine during 1963-1967 should receive 2 doses of MMR vaccine. People who received inactivated mumps vaccine or an unknown type of mumps vaccine before 1979 and are at high risk for mumps infection should consider immunization with 2 doses of MMR vaccine. Unvaccinated health care workers born before 1957 who lack laboratory evidence of measles, mumps, or rubella immunity or laboratory confirmation of disease should consider measles and mumps immunization with 2 doses of MMR vaccine or rubella immunization with 1 dose of MMR vaccine.  Pneumococcal 13-valent conjugate (PCV13) vaccine. When indicated, a person who is uncertain of his immunization history and has no record of immunization should receive the PCV13 vaccine. All adults 65 years of age and older should receive this vaccine. An  adult aged 19 years or older who has certain medical conditions and has not been previously immunized should receive 1 dose of PCV13 vaccine. This PCV13 should be followed with a dose of pneumococcal polysaccharide (PPSV23) vaccine. Adults who are at high risk for pneumococcal disease should obtain the PPSV23 vaccine at least 8 weeks after the dose of PCV13 vaccine. Adults older than 78 years of age who have normal immune system function should obtain the PPSV23 vaccine dose at least 1 year after the dose of PCV13 vaccine.  Pneumococcal polysaccharide (PPSV23) vaccine. When PCV13 is also indicated, PCV13 should be obtained first. All adults aged 65 years and older should be immunized. An adult younger than age 65 years who has certain medical conditions should be immunized. Any person who resides in a nursing home or long-term care facility should be immunized. An adult smoker should be immunized. People with an immunocompromised condition and certain other conditions should receive both PCV13 and PPSV23 vaccines. People with human immunodeficiency virus (HIV) infection should be immunized as soon as possible after diagnosis. Immunization during chemotherapy or radiation therapy should be avoided. Routine use of PPSV23 vaccine is not recommended for American Indians, [place] Natives, or people younger than 65 years unless there are medical conditions that require PPSV23 vaccine. When indicated, people who have unknown immunization and have no record of immunization should receive PPSV23 vaccine. One-time revaccination 5 years after the first dose of PPSV23 is recommended for people aged 19-64 years who have chronic kidney failure, nephrotic syndrome, asplenia, or immunocompromised conditions. People who received 1-2 doses of PPSV23 before age 65 years should receive another dose of PPSV23 vaccine at age 65 years or later if at least 5 years have passed since the previous dose. Doses of PPSV23 are not needed for  people immunized with PPSV23 at or after age 65 years.  Meningococcal vaccine. Adults with asplenia or persistent complement component deficiencies should receive 2 doses of quadrivalent meningococcal conjugate (MenACWY-D) vaccine. The doses should be obtained at least 2 months apart. Microbiologists working with certain meningococcal bacteria, military recruits, people at risk during an outbreak, and people who travel to or live in countries with a high rate of meningitis should be immunized. A first-year college student up through age 21 years   who is living in a residence hall should receive a dose if he did not receive a dose on or after his 16th birthday. Adults who have certain high-risk conditions should receive one or more doses of vaccine.  Hepatitis A vaccine. Adults who wish to be protected from this disease, have chronic liver disease, work with hepatitis A-infected animals, work in hepatitis A research labs, or travel to or work in countries with a high rate of hepatitis A should be immunized. Adults who were previously unvaccinated and who anticipate close contact with an international adoptee during the first 60 days after arrival in the [place] from a country with a high rate of hepatitis A should be immunized.  Hepatitis B vaccine. Adults should be immunized if they wish to be protected from this disease, are under age 55 years and have diabetes, have chronic liver disease, have had more than one sex partner in the past 6 months, may be exposed to blood or other infectious body fluids, are household contacts or sex partners of hepatitis B positive people, are clients or workers in certain care facilities, or travel to or work in countries with a high rate of hepatitis B.  Haemophilus influenzae type b (Hib) vaccine. A previously unvaccinated person with asplenia or sickle cell disease or having a scheduled splenectomy should receive 1 dose of Hib vaccine. Regardless of previous  immunization, a recipient of a hematopoietic stem cell transplant should receive a 3-dose series 6-12 months after his successful transplant. Hib vaccine is not recommended for adults with HIV infection. Preventive Service / Frequency Ages 66 to 7  Blood pressure check.** / Every 3-5 years.  Lipid and cholesterol check.** / Every 5 years beginning at age 28.  Hepatitis C blood test.** / For any individual with known risks for hepatitis C.  Skin self-exam. / Monthly.  Influenza vaccine. / Every year.  Tetanus, diphtheria, and acellular pertussis (Tdap, Td) vaccine.** / Consult your health care provider. 1 dose of Td every 10 years.  Varicella vaccine.** / Consult your health care provider.  HPV vaccine. / 3 doses over 6 months, if 31 or younger.  Measles, mumps, rubella (MMR) vaccine.** / You need at least 1 dose of MMR if you were born in 1957 or later. You may also need a second dose.  Pneumococcal 13-valent conjugate (PCV13) vaccine.** / Consult your health care provider.  Pneumococcal polysaccharide (PPSV23) vaccine.** / 1 to 2 doses if you smoke cigarettes or if you have certain conditions.  Meningococcal vaccine.** / 1 dose if you are age 35 to 43 years and a Market researcher living in a residence hall, or have one of several medical conditions. You may also need additional booster doses.  Hepatitis A vaccine.** / Consult your health care provider.  Hepatitis B vaccine.** / Consult your health care provider.  Haemophilus influenzae type b (Hib) vaccine.** / Consult your health care provider. Ages 34 to 73  Blood pressure check.** / Every year.  Lipid and cholesterol check.** / Every 5 years beginning at age 67.  Lung cancer screening. / Every year if you are aged 81-80 years and have a 30-pack-year history of smoking and currently smoke or have quit within the past 15 years. Yearly screening is stopped once you have quit smoking for at least 15 years or develop  a health problem that would prevent you from having lung cancer treatment.  Fecal occult blood test (FOBT) of stool. / Every year beginning at age 67 and  continuing until age 51. You may not have to do this test if you get a colonoscopy every 10 years.  Flexible sigmoidoscopy** or colonoscopy.** / Every 5 years for a flexible sigmoidoscopy or every 10 years for a colonoscopy beginning at age 42 and continuing until age 62.  Hepatitis C blood test.** / For all people born from [birth year] through 1965 and any individual with known risks for hepatitis C.  Skin self-exam. / Monthly.  Influenza vaccine. / Every year.  Tetanus, diphtheria, and acellular pertussis (Tdap/Td) vaccine.** / Consult your health care provider. 1 dose of Td every 10 years.  Varicella vaccine.** / Consult your health care provider.  Zoster vaccine.** / 1 dose for adults aged 87 years or older.  Measles, mumps, rubella (MMR) vaccine.** / You need at least 1 dose of MMR if you were born in 1957 or later. You may also need a second dose.  Pneumococcal 13-valent conjugate (PCV13) vaccine.** / Consult your health care provider.  Pneumococcal polysaccharide (PPSV23) vaccine.** / 1 to 2 doses if you smoke cigarettes or if you have certain conditions.  Meningococcal vaccine.** / Consult your health care provider.  Hepatitis A vaccine.** / Consult your health care provider.  Hepatitis B vaccine.** / Consult your health care provider.  Haemophilus influenzae type b (Hib) vaccine.** / Consult your health care provider. Ages 85 and over  Blood pressure check.** / Every year.  Lipid and cholesterol check.**/ Every 5 years beginning at age 10.  Lung cancer screening. / Every year if you are aged 67-80 years and have a 30-pack-year history of smoking and currently smoke or have quit within the past 15 years. Yearly screening is stopped once you have quit smoking for at least 15 years or develop a health problem that would prevent  you from having lung cancer treatment.  Fecal occult blood test (FOBT) of stool. / Every year beginning at age 66 and continuing until age 81. You may not have to do this test if you get a colonoscopy every 10 years.  Flexible sigmoidoscopy** or colonoscopy.** / Every 5 years for a flexible sigmoidoscopy or every 10 years for a colonoscopy beginning at age 42 and continuing until age 83.  Hepatitis C blood test.** / For all people born from [birth year] through 1965 and any individual with known risks for hepatitis C.  Abdominal aortic aneurysm (AAA) screening.** / A one-time screening for ages 44 to 95 years who are current or former smokers.  Skin self-exam. / Monthly.  Influenza vaccine. / Every year.  Tetanus, diphtheria, and acellular pertussis (Tdap/Td) vaccine.** / 1 dose of Td every 10 years.  Varicella vaccine.** / Consult your health care provider.  Zoster vaccine.** / 1 dose for adults aged 73 years or older.  Pneumococcal 13-valent conjugate (PCV13) vaccine.** / 1 dose for all adults aged 67 years and older.  Pneumococcal polysaccharide (PPSV23) vaccine.** / 1 dose for all adults aged 72 years and older.  Meningococcal vaccine.** / Consult your health care provider.  Hepatitis A vaccine.** / Consult your health care provider.  Hepatitis B vaccine.** / Consult your health care provider.  Haemophilus influenzae type b (Hib) vaccine.** / Consult your health care provider. **Family history and personal history of risk and conditions may change your health care provider's recommendations.   This information is not intended to replace advice given to you by your health care provider. Make sure you discuss any questions you have with your health care provider.   Document Released: 03/17/2001 Document  Revised: 02/09/2014 Document Reviewed: 06/16/2010 Elsevier Interactive Patient Education 2016 [employer]  

## 2015-03-12 NOTE — Assessment & Plan Note (Deleted)
Patient denies any difficulties at home. No trouble with ADLs, depression or falls. See EMR for functional status screen and depression screen. No recent changes to vision or hearing. Is UTD with immunizations. Is UTD with screening. Discussed Advanced Directives. Encouraged heart healthy diet, exercise as tolerated and adequate sleep. See patient's problem list for health risk factors to monitor. See AVS for preventative healthcare recommendation schedule. Labs today  Allergies verified: UTD  Immunization Status: Flu vaccine-- 10/04/14; patient reported Tdap-- 09/27/06 PNA-- 12/27/13 Shingles-- 08/06/08  A/P:  Changes to [place], PSH or Personal Hx: UTD CCS: 02/03/04; normal; patient reported PSA: 02/02/14 w/ Dr. Tresa Endo at [facility]; patient reported Bone Density: NA  Care Teams Updated: Dr. Tresa Endo - Urology  ED/Hospital/Urgent Care Visits: Per the patient, no recent visits to the ED/Hospital or Urgent Care.  To Discuss with Provider: Patient voiced that he does not want to have another colonoscopy, but instead would like to have the cologuard testing completed.

## 2015-03-12 NOTE — Assessment & Plan Note (Addendum)
Reasonable numbers on review of home numbers but has a cough with Lisinopril, will switch to Losartan 25 mg po daily, bp check in 1 month

## 2015-03-12 NOTE — Assessment & Plan Note (Signed)
Encouraged DASH diet, decrease po intake and increase exercise as tolerated. Needs 7-8 hours of sleep nightly. Avoid trans fats, eat small, frequent meals every 4-5 hours with lean proteins, complex carbs and healthy fats 

## 2015-03-14 ENCOUNTER — Other Ambulatory Visit: Payer: Self-pay | Admitting: Family Medicine

## 2015-03-14 MED ORDER — ROSUVASTATIN CALCIUM 5 MG PO TABS: ORAL_TABLET | ORAL | Status: DC

## 2015-03-17 ENCOUNTER — Encounter: Payer: Self-pay | Admitting: Family Medicine

## 2015-03-17 NOTE — Assessment & Plan Note (Signed)
minimize simple carbs. Increase exercise as tolerated.  

## 2015-03-17 NOTE — Assessment & Plan Note (Signed)
Patient denies any difficulties at home. No trouble with ADLs, depression or falls. See EMR for functional status screen and depression screen. No recent changes to vision or hearing. Is UTD with immunizations. Is UTD with screening. Discussed Advanced Directives. Encouraged heart healthy diet, exercise as tolerated and adequate sleep. See patient's problem list for health risk factors to monitor. See AVS for preventative healthcare recommendation schedule. Labs today  Allergies verified: UTD  Immunization Status: Flu vaccine-- 10/04/14; patient reported Tdap-- 09/27/06 PNA-- 12/27/13 Shingles-- 08/06/08  A/P:  Changes to Matthew [name], PSH or Personal Hx: UTD CCS: 02/03/04; normal; patient reported PSA: 02/02/14 w/ Dr. Tresa Endo at [facility]; patient reported Bone Density: NA  Care Teams Updated: Dr. Tresa Endo - Urology  ED/Hospital/Urgent Care Visits: Per the patient, no recent visits to the ED/Hospital or Urgent Care.  To Discuss with Provider: Patient voiced that he does not want to have another colonoscopy, but instead would like to have the cologuard testing completed.

## 2015-03-18 DIAGNOSIS — M5114 Intervertebral disc disorders with radiculopathy, thoracic region: Secondary | ICD-10-CM | POA: Diagnosis not present

## 2015-03-18 DIAGNOSIS — M9902 Segmental and somatic dysfunction of thoracic region: Secondary | ICD-10-CM | POA: Diagnosis not present

## 2015-03-21 DIAGNOSIS — Z1211 Encounter for screening for malignant neoplasm of colon: Secondary | ICD-10-CM | POA: Diagnosis not present

## 2015-03-21 DIAGNOSIS — Z1212 Encounter for screening for malignant neoplasm of rectum: Secondary | ICD-10-CM | POA: Diagnosis not present

## 2015-03-21 LAB — COLOGUARD: COLOGUARD: NEGATIVE

## 2015-03-22 ENCOUNTER — Telehealth: Payer: Self-pay | Admitting: Family Medicine

## 2015-03-22 NOTE — Telephone Encounter (Signed)
Patient informed PCP instructions.  He had 3 weeks of lisinopril 40 mg and will not need any sent in yet.  He will call us back in a week to see how he has done.

## 2015-03-22 NOTE — Telephone Encounter (Signed)
Return to lisinopril for one week and let us know how he is, if he improves I would suggest he try switching back to Losartan one more time, if symptoms recure we are done with it. Can call in a month's worth of Lisinorpril

## 2015-03-22 NOTE — Telephone Encounter (Signed)
Patient was changed from lisinopril to losartan at his OV last Tuesday 03/12/15.  He started taking the losartan on Tuesday (Stopped lisinopril due to causing nasal drip).  Last night he woke with terrible leg cramps and took 30 mins to walk them out and today having difficult time getting out of the car to walk.  I did inform him to stop losartan until PCP reviews message with instructions. Call back home number [phone] cell number [phone]. He is ok to start back on Lisinopril, states the runny nose he can deal with better than the leg cramps.

## 2015-04-01 ENCOUNTER — Telehealth: Payer: Self-pay | Admitting: Family Medicine

## 2015-04-01 MED ORDER — AMLODIPINE BESYLATE 5 MG PO TABS: 5.0000 mg | ORAL_TABLET | Freq: Every day | ORAL | Status: DC

## 2015-04-01 NOTE — Telephone Encounter (Signed)
Refill done.  

## 2015-04-01 NOTE — Telephone Encounter (Signed)
Caller name: Relationship to patient: Can be reached: Pharmacy: [id number] - [place], [place] - [address] AT [address]  Reason for call: Pt needing amlodipine (just 15 doses) sent in to local pharmacy. He has not received supply from the mail order pharmacy yet and he has 2 pills left.

## 2015-04-05 ENCOUNTER — Telehealth: Payer: Self-pay | Admitting: Family Medicine

## 2015-04-05 ENCOUNTER — Encounter: Payer: Self-pay | Admitting: Family Medicine

## 2015-04-05 NOTE — Telephone Encounter (Signed)
Received Cologard results which were negative. Have mailed a copy to the patients home. Abstracted into the chart.

## 2015-04-08 ENCOUNTER — Telehealth: Payer: Self-pay | Admitting: *Deleted

## 2015-04-08 NOTE — Telephone Encounter (Signed)
Received POA & Living Will paperwork, numbered and initialed; forwarded to Jackie/SLS 03/03

## 2015-04-09 ENCOUNTER — Ambulatory Visit (INDEPENDENT_AMBULATORY_CARE_PROVIDER_SITE_OTHER): Payer: Medicare Other | Admitting: Family Medicine

## 2015-04-09 ENCOUNTER — Encounter: Payer: Self-pay | Admitting: Family Medicine

## 2015-04-09 VITALS — BP 128/68 | HR 73 | Temp 98.2°F | Ht 67.0 in | Wt 218.1 lb

## 2015-04-09 DIAGNOSIS — I10 Essential (primary) hypertension: Secondary | ICD-10-CM | POA: Diagnosis not present

## 2015-04-09 DIAGNOSIS — R739 Hyperglycemia, unspecified: Secondary | ICD-10-CM

## 2015-04-09 DIAGNOSIS — R0600 Dyspnea, unspecified: Secondary | ICD-10-CM

## 2015-04-09 DIAGNOSIS — K219 Gastro-esophageal reflux disease without esophagitis: Secondary | ICD-10-CM | POA: Diagnosis not present

## 2015-04-09 DIAGNOSIS — E785 Hyperlipidemia, unspecified: Secondary | ICD-10-CM

## 2015-04-09 MED ORDER — FENOFIBRATE MICRONIZED 130 MG PO CAPS
130.0000 mg | ORAL_CAPSULE | Freq: Every day | ORAL | Status: DC
Start: 2015-04-09 — End: 2015-04-22

## 2015-04-09 NOTE — Progress Notes (Signed)
Pre visit review using our clinic review tool, if applicable. No additional management support is needed unless otherwise documented below in the visit note. 

## 2015-04-09 NOTE — Assessment & Plan Note (Signed)
Avoid offending foods, start probiotics. Do not eat large meals in late evening and consider raising head of bed.  

## 2015-04-09 NOTE — Patient Instructions (Signed)
DASH Eating Plan  DASH stands for "Dietary Approaches to Stop Hypertension." The DASH eating plan is a healthy eating plan that has been shown to reduce high blood pressure (hypertension). Additional health benefits may include reducing the risk of type 2 diabetes mellitus, heart disease, and stroke. The DASH eating plan may also help with weight loss.  WHAT DO I NEED TO KNOW ABOUT THE DASH EATING PLAN?  For the DASH eating plan, you will follow these general guidelines:  · Choose foods with a percent daily value for sodium of less than 5% (as listed on the food label).  · Use salt-free seasonings or herbs instead of table salt or sea salt.  · Check with your health care provider or pharmacist before using salt substitutes.  · Eat lower-sodium products, often labeled as "lower sodium" or "no salt added."  · Eat fresh foods.  · Eat more vegetables, fruits, and low-fat dairy products.  · Choose whole grains. Look for the word "whole" as the first word in the ingredient list.  · Choose fish and skinless chicken or turkey more often than red meat. Limit fish, poultry, and meat to 6 oz (170 g) each day.  · Limit sweets, desserts, sugars, and sugary drinks.  · Choose heart-healthy fats.  · Limit cheese to 1 oz (28 g) per day.  · Eat more home-cooked food and less restaurant, buffet, and fast food.  · Limit fried foods.  · Cook foods using methods other than frying.  · Limit canned vegetables. If you do use them, rinse them well to decrease the sodium.  · When eating at a restaurant, ask that your food be prepared with less salt, or no salt if possible.  WHAT FOODS CAN I EAT?  Seek help from a dietitian for individual calorie needs.  Grains  Whole grain or whole wheat bread. Brown rice. Whole grain or whole wheat pasta. Quinoa, bulgur, and whole grain cereals. Low-sodium cereals. Corn or whole wheat flour tortillas. Whole grain cornbread. Whole grain crackers. Low-sodium crackers.  Vegetables  Fresh or frozen vegetables  (raw, steamed, roasted, or grilled). Low-sodium or reduced-sodium tomato and vegetable juices. Low-sodium or reduced-sodium tomato sauce and paste. Low-sodium or reduced-sodium canned vegetables.   Fruits  All fresh, canned (in natural juice), or frozen fruits.  Meat and Other Protein Products  Ground beef (85% or leaner), grass-fed beef, or beef trimmed of fat. Skinless chicken or turkey. Ground chicken or turkey. Pork trimmed of fat. All fish and seafood. Eggs. Dried beans, peas, or lentils. Unsalted nuts and seeds. Unsalted canned beans.  Dairy  Low-fat dairy products, such as skim or 1% milk, 2% or reduced-fat cheeses, low-fat ricotta or cottage cheese, or plain low-fat yogurt. Low-sodium or reduced-sodium cheeses.  Fats and Oils  Tub margarines without trans fats. Light or reduced-fat mayonnaise and salad dressings (reduced sodium). Avocado. Safflower, olive, or canola oils. Natural peanut or almond butter.  Other  Unsalted popcorn and pretzels.  The items listed above may not be a complete list of recommended foods or beverages. Contact your dietitian for more options.  WHAT FOODS ARE NOT RECOMMENDED?  Grains  White bread. White pasta. White rice. Refined cornbread. Bagels and croissants. Crackers that contain trans fat.  Vegetables  Creamed or fried vegetables. Vegetables in a cheese sauce. Regular canned vegetables. Regular canned tomato sauce and paste. Regular tomato and vegetable juices.  Fruits  Dried fruits. Canned fruit in light or heavy syrup. Fruit juice.  Meat and Other Protein   Products  Fatty cuts of meat. Ribs, chicken wings, bacon, sausage, bologna, salami, chitterlings, fatback, hot dogs, bratwurst, and packaged luncheon meats. Salted nuts and seeds. Canned beans with salt.  Dairy  Whole or 2% milk, cream, half-and-half, and cream cheese. Whole-fat or sweetened yogurt. Full-fat cheeses or blue cheese. Nondairy creamers and whipped toppings. Processed cheese, cheese spreads, or cheese  curds.  Condiments  Onion and garlic salt, seasoned salt, table salt, and sea salt. Canned and packaged gravies. Worcestershire sauce. Tartar sauce. Barbecue sauce. Teriyaki sauce. Soy sauce, including reduced sodium. Steak sauce. Fish sauce. Oyster sauce. Cocktail sauce. Horseradish. Ketchup and mustard. Meat flavorings and tenderizers. Bouillon cubes. Hot sauce. Tabasco sauce. Marinades. Taco seasonings. Relishes.  Fats and Oils  Butter, stick margarine, lard, shortening, ghee, and bacon fat. Coconut, palm kernel, or palm oils. Regular salad dressings.  Other  Pickles and olives. Salted popcorn and pretzels.  The items listed above may not be a complete list of foods and beverages to avoid. Contact your dietitian for more information.  WHERE CAN I FIND MORE INFORMATION?  National Heart, Lung, and Blood Institute: www.nhlbi.nih.gov/health/health-topics/topics/dash/     This information is not intended to replace advice given to you by your health care provider. Make sure you discuss any questions you have with your health care provider.     Document Released: 01/08/2011 Document Revised: 02/09/2014 Document Reviewed: 11/23/2012  Elsevier Interactive Patient Education ©2016 Elsevier Inc.

## 2015-04-09 NOTE — Assessment & Plan Note (Signed)
Encouraged DASH diet, decrease po intake and increase exercise as tolerated. Needs 7-8 hours of sleep nightly. Avoid trans fats, eat small, frequent meals every 4-5 hours with lean proteins, complex carbs and healthy fats. Minimize simple carbs 
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
## 2015-04-09 NOTE — Assessment & Plan Note (Signed)
Well controlled, no changes to meds. Encouraged heart healthy diet such as the DASH diet and exercise as tolerated.  °

## 2015-04-09 NOTE — Assessment & Plan Note (Signed)
hgba1c acceptable, minimize simple carbs. Increase exercise as tolerated.  

## 2015-04-15 DIAGNOSIS — M5114 Intervertebral disc disorders with radiculopathy, thoracic region: Secondary | ICD-10-CM | POA: Diagnosis not present

## 2015-04-15 DIAGNOSIS — M9902 Segmental and somatic dysfunction of thoracic region: Secondary | ICD-10-CM | POA: Diagnosis not present

## 2015-04-16 ENCOUNTER — Telehealth: Payer: Self-pay | Admitting: *Deleted

## 2015-04-16 NOTE — Telephone Encounter (Signed)
Received request for PA on Fenofibrate Micronized 130 mg caps; Initiated via Cover My Meds, awaiting response/SLS 03/14

## 2015-04-17 NOTE — Telephone Encounter (Signed)
Matthew [name] called in from [place] [facility] [place] stating that medication has been denied. He says that the denial reason is because of the dosage prescribed. He suggest that PCP prescribe 134 mg caps instead for approval.    Matthew [name] with [facility] [place]. CB: [phone]

## 2015-04-17 NOTE — Telephone Encounter (Signed)
The Alternative medications include: 1) Fenofibrate micronized cap 134 mg; 2) Fenofibrate tablet; 3) Choline fenofibrate DR cap 135 mg/SLS 03/15

## 2015-04-17 NOTE — Telephone Encounter (Signed)
Please change the fenofibrate to 134 mg per insurance request. 1 daily, same number same number of refills

## 2015-04-18 DIAGNOSIS — Z85828 Personal history of other malignant neoplasm of skin: Secondary | ICD-10-CM | POA: Diagnosis not present

## 2015-04-18 DIAGNOSIS — C44119 Basal cell carcinoma of skin of left eyelid, including canthus: Secondary | ICD-10-CM | POA: Diagnosis not present

## 2015-04-18 DIAGNOSIS — D2239 Melanocytic nevi of other parts of face: Secondary | ICD-10-CM | POA: Diagnosis not present

## 2015-04-18 DIAGNOSIS — C44112 Basal cell carcinoma of skin of right eyelid, including canthus: Secondary | ICD-10-CM | POA: Diagnosis not present

## 2015-04-18 DIAGNOSIS — D485 Neoplasm of uncertain behavior of skin: Secondary | ICD-10-CM | POA: Diagnosis not present

## 2015-04-22 MED ORDER — FENOFIBRATE MICRONIZED 134 MG PO CAPS: 134.0000 mg | ORAL_CAPSULE | Freq: Every day | ORAL | Status: DC

## 2015-04-22 NOTE — Telephone Encounter (Signed)
Patient converted from Fenofibrate 130 mg prescription, to Fenofibrate 134 mg prescription, for Insurance coverage, interchange authorized by Dr. Reatha Armour 03/20

## 2015-04-22 NOTE — Addendum Note (Signed)
Addended by: Rockwell Germany on: 04/22/2015 08:17 AM   Modules accepted: Orders

## 2015-04-23 ENCOUNTER — Telehealth: Payer: Self-pay | Admitting: Family Medicine

## 2015-04-23 ENCOUNTER — Ambulatory Visit (INDEPENDENT_AMBULATORY_CARE_PROVIDER_SITE_OTHER): Payer: Medicare Other | Admitting: Family

## 2015-04-23 ENCOUNTER — Encounter: Payer: Self-pay | Admitting: Family

## 2015-04-23 VITALS — BP 122/72 | HR 85 | Temp 98.2°F | Resp 20 | Ht 67.0 in | Wt 214.4 lb

## 2015-04-23 DIAGNOSIS — K625 Hemorrhage of anus and rectum: Secondary | ICD-10-CM

## 2015-04-23 LAB — CBC WITH DIFFERENTIAL/PLATELET
BASOS ABS: 0 10*3/uL (ref 0.0–0.1)
Basophils Relative: 0.3 % (ref 0.0–3.0)
EOS PCT: 1.3 % (ref 0.0–5.0)
Eosinophils Absolute: 0.1 10*3/uL (ref 0.0–0.7)
HCT: 43.8 % (ref 39.0–52.0)
HEMOGLOBIN: 14.7 g/dL (ref 13.0–17.0)
Lymphocytes Relative: 16.3 % (ref 12.0–46.0)
Lymphs Abs: 1.7 10*3/uL (ref 0.7–4.0)
MCHC: 33.6 g/dL (ref 30.0–36.0)
MCV: 88.9 fl (ref 78.0–100.0)
MONO ABS: 0.8 10*3/uL (ref 0.1–1.0)
MONOS PCT: 7.4 % (ref 3.0–12.0)
Neutro Abs: 7.8 10*3/uL — ABNORMAL HIGH (ref 1.4–7.7)
Neutrophils Relative %: 74.7 % (ref 43.0–77.0)
Platelets: 212 10*3/uL (ref 150.0–400.0)
RBC: 4.92 Mil/uL (ref 4.22–5.81)
RDW: 13.9 % (ref 11.5–15.5)
WBC: 10.4 10*3/uL (ref 4.0–10.5)

## 2015-04-23 NOTE — Telephone Encounter (Signed)
Patient Name: Matthew [name]  DOB: 1935-08-15    Initial Comment Caller states he woke up had a BM about an hour ago- bloody   Nurse Assessment  Nurse: Leilani Merl, RN, Nira Conn Date/Time (Eastern Time): 04/23/2015 8:11:48 AM  Confirm and document reason for call. If symptomatic, describe symptoms. You must click the next button to save text entered. ---Caller states he woke up had a BM about an hour ago- bloody  Has the patient traveled out of the country within the last 30 days? ---Not Applicable  Does the patient have any new or worsening symptoms? ---Yes  Will a triage be completed? ---Yes  Related visit to physician within the last 2 weeks? ---No  Does the PT have any chronic conditions? (i.e. diabetes, asthma, etc.) ---Yes  List chronic conditions. ---See MR  Is this a behavioral health or substance abuse call? ---No     Guidelines    Guideline Title Affirmed Question Affirmed Notes  Rectal Bleeding MODERATE rectal bleeding (small blood clots, passing blood without stool, or toilet water turns red)    Final Disposition User   See Physician within 24 Hours Standifer, RN, Water quality scientist    Comments  Made appt with Debbrah Alar at 1 pm today.   Referrals  REFERRED TO PCP OFFICE   Disagree/Comply: Comply

## 2015-04-23 NOTE — Patient Instructions (Signed)
Please complete lab work prior to leaving.   You will be contacted about your referral to GI. Please go to the ER if you develop more than 1 stool with blood in it again. Continue high fiber diet and plenty of water to avoid constipation.
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
## 2015-04-23 NOTE — Progress Notes (Signed)
Pre visit review using our clinic review tool, if applicable. No additional management support is needed unless otherwise documented below in the visit note. 

## 2015-04-24 ENCOUNTER — Ambulatory Visit (HOSPITAL_BASED_OUTPATIENT_CLINIC_OR_DEPARTMENT_OTHER)
Admission: RE | Admit: 2015-04-24 | Discharge: 2015-04-24 | Disposition: A | Discharge status: Home / Self Care | Payer: Medicare Other | Source: Ambulatory Visit | Attending: Family Medicine | Admitting: Family Medicine

## 2015-04-24 ENCOUNTER — Encounter: Payer: Self-pay | Admitting: Family

## 2015-04-24 DIAGNOSIS — E785 Hyperlipidemia, unspecified: Secondary | ICD-10-CM | POA: Diagnosis not present

## 2015-04-24 DIAGNOSIS — I10 Essential (primary) hypertension: Secondary | ICD-10-CM | POA: Diagnosis not present

## 2015-04-24 DIAGNOSIS — R0600 Dyspnea, unspecified: Secondary | ICD-10-CM | POA: Insufficient documentation

## 2015-04-24 DIAGNOSIS — Z87891 Personal history of nicotine dependence: Secondary | ICD-10-CM | POA: Diagnosis not present

## 2015-04-24 DIAGNOSIS — I119 Hypertensive heart disease without heart failure: Secondary | ICD-10-CM | POA: Diagnosis not present

## 2015-04-24 DIAGNOSIS — R785 Finding of other psychotropic drug in blood: Secondary | ICD-10-CM | POA: Diagnosis not present

## 2015-04-24 DIAGNOSIS — R739 Hyperglycemia, unspecified: Secondary | ICD-10-CM

## 2015-04-24 DIAGNOSIS — K219 Gastro-esophageal reflux disease without esophagitis: Secondary | ICD-10-CM | POA: Diagnosis not present

## 2015-04-24 NOTE — Progress Notes (Signed)
  Echocardiogram 2D Echocardiogram has been performed.  Matthew [name] 04/24/2015, 11:19 AM

## 2015-04-30 ENCOUNTER — Encounter: Payer: Self-pay | Admitting: Gastroenterology

## 2015-04-30 ENCOUNTER — Ambulatory Visit (INDEPENDENT_AMBULATORY_CARE_PROVIDER_SITE_OTHER): Payer: Medicare Other | Admitting: Gastroenterology

## 2015-04-30 VITALS — BP 116/70 | HR 68 | Ht 68.0 in | Wt 213.0 lb

## 2015-04-30 DIAGNOSIS — K625 Hemorrhage of anus and rectum: Secondary | ICD-10-CM | POA: Diagnosis not present

## 2015-04-30 DIAGNOSIS — K649 Unspecified hemorrhoids: Secondary | ICD-10-CM

## 2015-04-30 NOTE — Patient Instructions (Signed)
Follow up as needed  Use Fiber daily
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
## 2015-05-13 DIAGNOSIS — M9902 Segmental and somatic dysfunction of thoracic region: Secondary | ICD-10-CM | POA: Diagnosis not present

## 2015-05-13 DIAGNOSIS — M5114 Intervertebral disc disorders with radiculopathy, thoracic region: Secondary | ICD-10-CM | POA: Diagnosis not present

## 2015-05-16 DIAGNOSIS — Z85828 Personal history of other malignant neoplasm of skin: Secondary | ICD-10-CM | POA: Diagnosis not present

## 2015-05-16 DIAGNOSIS — C44112 Basal cell carcinoma of skin of right eyelid, including canthus: Secondary | ICD-10-CM | POA: Diagnosis not present

## 2015-05-27 ENCOUNTER — Other Ambulatory Visit: Payer: Self-pay | Admitting: Family Medicine

## 2015-05-27 MED ORDER — OMEPRAZOLE 20 MG PO CPDR: 20.0000 mg | DELAYED_RELEASE_CAPSULE | Freq: Every day | ORAL | Status: DC

## 2015-06-04 ENCOUNTER — Other Ambulatory Visit (INDEPENDENT_AMBULATORY_CARE_PROVIDER_SITE_OTHER): Payer: Medicare Other

## 2015-06-04 DIAGNOSIS — R739 Hyperglycemia, unspecified: Secondary | ICD-10-CM | POA: Diagnosis not present

## 2015-06-04 DIAGNOSIS — I10 Essential (primary) hypertension: Secondary | ICD-10-CM | POA: Diagnosis not present

## 2015-06-04 DIAGNOSIS — E785 Hyperlipidemia, unspecified: Secondary | ICD-10-CM

## 2015-06-04 DIAGNOSIS — K219 Gastro-esophageal reflux disease without esophagitis: Secondary | ICD-10-CM | POA: Diagnosis not present

## 2015-06-04 DIAGNOSIS — R0600 Dyspnea, unspecified: Secondary | ICD-10-CM

## 2015-06-04 LAB — COMPREHENSIVE METABOLIC PANEL
ALBUMIN: 4 g/dL (ref 3.5–5.2)
ALK PHOS: 58 U/L (ref 39–117)
ALT: 21 U/L (ref 0–53)
AST: 21 U/L (ref 0–37)
BUN: 13 mg/dL (ref 6–23)
CALCIUM: 9.7 mg/dL (ref 8.4–10.5)
CO2: 28 mEq/L (ref 19–32)
Chloride: 105 mEq/L (ref 96–112)
Creatinine, Ser: 0.93 mg/dL (ref 0.40–1.50)
GFR: 83.55 mL/min (ref >60.00)
Glucose, Bld: 126 mg/dL — ABNORMAL HIGH (ref 70–99)
POTASSIUM: 3.8 meq/L (ref 3.5–5.1)
Sodium: 140 mEq/L (ref 135–145)
TOTAL PROTEIN: 7 g/dL (ref 6.0–8.3)
Total Bilirubin: 0.5 mg/dL (ref 0.2–1.2)

## 2015-06-04 LAB — LIPID PANEL
CHOLESTEROL: 236 mg/dL — AB (ref 0–200)
HDL: 35.7 mg/dL — ABNORMAL LOW (ref >39.00)
LDL Cholesterol: 172 mg/dL — ABNORMAL HIGH (ref 0–99)
NonHDL: 200.2
TRIGLYCERIDES: 141 mg/dL (ref 0.0–149.0)
Total CHOL/HDL Ratio: 7
VLDL: 28.2 mg/dL (ref 0.0–40.0)

## 2015-06-04 LAB — CBC
HEMATOCRIT: 41 % (ref 39.0–52.0)
HEMOGLOBIN: 14.1 g/dL (ref 13.0–17.0)
MCHC: 34.4 g/dL (ref 30.0–36.0)
MCV: 87.2 fl (ref 78.0–100.0)
PLATELETS: 205 10*3/uL (ref 150.0–400.0)
RBC: 4.7 Mil/uL (ref 4.22–5.81)
RDW: 13.4 % (ref 11.5–15.5)
WBC: 7.8 10*3/uL (ref 4.0–10.5)

## 2015-06-04 LAB — HEMOGLOBIN A1C: Hgb A1c MFr Bld: 6.3 % (ref 4.6–6.5)

## 2015-06-04 LAB — TSH: TSH: 2.23 u[IU]/mL (ref 0.35–4.50)

## 2015-06-10 DIAGNOSIS — M9902 Segmental and somatic dysfunction of thoracic region: Secondary | ICD-10-CM | POA: Diagnosis not present

## 2015-06-10 DIAGNOSIS — M5114 Intervertebral disc disorders with radiculopathy, thoracic region: Secondary | ICD-10-CM | POA: Diagnosis not present

## 2015-06-13 ENCOUNTER — Ambulatory Visit: Payer: Medicare Other | Admitting: Family Medicine

## 2015-06-18 ENCOUNTER — Encounter: Payer: Self-pay | Admitting: Family Medicine

## 2015-06-18 ENCOUNTER — Ambulatory Visit (INDEPENDENT_AMBULATORY_CARE_PROVIDER_SITE_OTHER): Payer: Medicare Other | Admitting: Family Medicine

## 2015-06-18 VITALS — BP 128/76 | HR 71 | Temp 97.8°F | Ht 68.0 in | Wt 217.4 lb

## 2015-06-18 DIAGNOSIS — I10 Essential (primary) hypertension: Secondary | ICD-10-CM | POA: Diagnosis not present

## 2015-06-18 DIAGNOSIS — K219 Gastro-esophageal reflux disease without esophagitis: Secondary | ICD-10-CM

## 2015-06-18 DIAGNOSIS — E669 Obesity, unspecified: Secondary | ICD-10-CM

## 2015-06-18 DIAGNOSIS — E785 Hyperlipidemia, unspecified: Secondary | ICD-10-CM

## 2015-06-18 DIAGNOSIS — R739 Hyperglycemia, unspecified: Secondary | ICD-10-CM

## 2015-06-18 MED ORDER — FENOFIBRATE MICRONIZED 134 MG PO CAPS: 134.0000 mg | ORAL_CAPSULE | ORAL | Status: DC

## 2015-06-18 NOTE — Assessment & Plan Note (Signed)
Well controlled, no changes to meds. Encouraged heart healthy diet such as the DASH diet and exercise as tolerated.  °

## 2015-06-18 NOTE — Assessment & Plan Note (Signed)
hgba1c acceptable, minimize simple carbs. Increase exercise as tolerated.  

## 2015-06-18 NOTE — Assessment & Plan Note (Addendum)
Avoid offending foods, start probiotics NOW probiotic company. Do not eat large meals in late evening and consider raising head of bed.

## 2015-06-18 NOTE — Progress Notes (Signed)
Pre visit review using our clinic review tool, if applicable. No additional management support is needed unless otherwise documented below in the visit note. 
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
## 2015-06-18 NOTE — Assessment & Plan Note (Signed)
Only tolerates Fenofibrate in every other day dosing, has myalgias with daily dosing, continue same

## 2015-06-18 NOTE — Assessment & Plan Note (Signed)
Encouraged DASH diet, decrease po intake and increase exercise as tolerated. Needs 7-8 hours of sleep nightly. Avoid trans fats, eat small, frequent meals every 4-5 hours with lean proteins, complex carbs and healthy fats. Minimize simple carbs. Lives at [facility] so does not have full control of his menu

## 2015-06-18 NOTE — Patient Instructions (Addendum)
NOW Probiotic Vitamin. Available at [employer].  DASH Eating Plan DASH stands for "Dietary Approaches to Stop Hypertension." The DASH eating plan is a healthy eating plan that has been shown to reduce high blood pressure (hypertension). Additional health benefits may include reducing the risk of type 2 diabetes mellitus, heart disease, and stroke. The DASH eating plan may also help with weight loss. WHAT DO I NEED TO KNOW ABOUT THE DASH EATING PLAN? For the DASH eating plan, you will follow these general guidelines:  Choose foods with a percent daily value for sodium of less than 5% (as listed on the food label).  Use salt-free seasonings or herbs instead of table salt or sea salt.  Check with your health care provider or pharmacist before using salt substitutes.  Eat lower-sodium products, often labeled as "lower sodium" or "no salt added."  Eat fresh foods.  Eat more vegetables, fruits, and low-fat dairy products.  Choose whole grains. Look for the word "whole" as the first word in the ingredient list.  Choose fish and skinless chicken or [place] more often than red meat. Limit fish, poultry, and meat to 6 oz (170 g) each day.  Limit sweets, desserts, sugars, and sugary drinks.  Choose heart-healthy fats.  Limit cheese to 1 oz (28 g) per day.  Eat more home-cooked food and less restaurant, buffet, and fast food.  Limit fried foods.  Cook foods using methods other than frying.  Limit canned vegetables. If you do use them, rinse them well to decrease the sodium.  When eating at a restaurant, ask that your food be prepared with less salt, or no salt if possible. WHAT FOODS CAN I EAT? Seek help from a dietitian for individual calorie needs. Grains Whole grain or whole wheat bread. Brown rice. Whole grain or whole wheat pasta. Quinoa, bulgur, and whole grain cereals. Low-sodium cereals. Corn or whole wheat flour tortillas. Whole grain cornbread. Whole grain crackers.  Low-sodium crackers. Vegetables Fresh or frozen vegetables (raw, steamed, roasted, or grilled). Low-sodium or reduced-sodium tomato and vegetable juices. Low-sodium or reduced-sodium tomato sauce and paste. Low-sodium or reduced-sodium canned vegetables.  Fruits All fresh, canned (in natural juice), or frozen fruits. Meat and Other Protein Products Ground beef (85% or leaner), grass-fed beef, or beef trimmed of fat. Skinless chicken or [place]. Ground chicken or [place]. Pork trimmed of fat. All fish and seafood. Eggs. Dried beans, peas, or lentils. Unsalted nuts and seeds. Unsalted canned beans. Dairy Low-fat dairy products, such as skim or 1% milk, 2% or reduced-fat cheeses, low-fat ricotta or cottage cheese, or plain low-fat yogurt. Low-sodium or reduced-sodium cheeses. Fats and Oils Tub margarines without trans fats. Light or reduced-fat mayonnaise and salad dressings (reduced sodium). Avocado. Safflower, olive, or canola oils. Natural peanut or almond butter. Other Unsalted popcorn and pretzels. The items listed above may not be a complete list of recommended foods or beverages. Contact your dietitian for more options. WHAT FOODS ARE NOT RECOMMENDED? Grains White bread. White pasta. White rice. Refined cornbread. Bagels and croissants. Crackers that contain trans fat. Vegetables Creamed or fried vegetables. Vegetables in a cheese sauce. Regular canned vegetables. Regular canned tomato sauce and paste. Regular tomato and vegetable juices. Fruits Dried fruits. Canned fruit in light or heavy syrup. Fruit juice. Meat and Other Protein Products Fatty cuts of meat. Ribs, chicken wings, bacon, sausage, bologna, salami, chitterlings, fatback, hot dogs, bratwurst, and packaged luncheon meats. Salted nuts and seeds. Canned beans with salt. Dairy Whole or 2% milk, cream, half-and-half,  and cream cheese. Whole-fat or sweetened yogurt. Full-fat cheeses or blue cheese. Nondairy creamers and whipped  toppings. Processed cheese, cheese spreads, or cheese curds. Condiments Onion and garlic salt, seasoned salt, table salt, and sea salt. Canned and packaged gravies. Worcestershire sauce. Tartar sauce. Barbecue sauce. Teriyaki sauce. Soy sauce, including reduced sodium. Steak sauce. Fish sauce. Oyster sauce. Cocktail sauce. Horseradish. Ketchup and mustard. Meat flavorings and tenderizers. Bouillon cubes. Hot sauce. Tabasco sauce. Marinades. Taco seasonings. Relishes. Fats and Oils Butter, stick margarine, lard, shortening, ghee, and bacon fat. Coconut, palm kernel, or palm oils. Regular salad dressings. Other Pickles and olives. Salted popcorn and pretzels. The items listed above may not be a complete list of foods and beverages to avoid. Contact your dietitian for more information. WHERE CAN I FIND MORE INFORMATION? National Heart, Lung, and Blood Institute: travelstabloid.com   This information is not intended to replace advice given to you by your health care provider. Make sure you discuss any questions you have with your health care provider.   Document Released: 01/08/2011 Document Revised: 02/09/2014 Document Reviewed: 11/23/2012 Elsevier Interactive Patient Education [employer].

## 2015-07-08 DIAGNOSIS — M9902 Segmental and somatic dysfunction of thoracic region: Secondary | ICD-10-CM | POA: Diagnosis not present

## 2015-07-08 DIAGNOSIS — M5114 Intervertebral disc disorders with radiculopathy, thoracic region: Secondary | ICD-10-CM | POA: Diagnosis not present

## 2015-08-07 DIAGNOSIS — M5114 Intervertebral disc disorders with radiculopathy, thoracic region: Secondary | ICD-10-CM | POA: Diagnosis not present

## 2015-08-07 DIAGNOSIS — M9902 Segmental and somatic dysfunction of thoracic region: Secondary | ICD-10-CM | POA: Diagnosis not present

## 2015-08-26 DIAGNOSIS — C61 Malignant neoplasm of prostate: Secondary | ICD-10-CM | POA: Diagnosis not present

## 2015-08-26 DIAGNOSIS — Z8546 Personal history of malignant neoplasm of prostate: Secondary | ICD-10-CM | POA: Diagnosis not present

## 2015-08-26 DIAGNOSIS — N5231 Erectile dysfunction following radical prostatectomy: Secondary | ICD-10-CM | POA: Diagnosis not present

## 2015-09-04 DIAGNOSIS — M5114 Intervertebral disc disorders with radiculopathy, thoracic region: Secondary | ICD-10-CM | POA: Diagnosis not present

## 2015-09-04 DIAGNOSIS — M9902 Segmental and somatic dysfunction of thoracic region: Secondary | ICD-10-CM | POA: Diagnosis not present

## 2015-09-10 ENCOUNTER — Ambulatory Visit: Payer: Medicare Other | Admitting: Family Medicine

## 2015-09-12 ENCOUNTER — Ambulatory Visit: Payer: Medicare Other | Admitting: Family Medicine

## 2015-09-17 DIAGNOSIS — H2513 Age-related nuclear cataract, bilateral: Secondary | ICD-10-CM | POA: Diagnosis not present

## 2015-09-23 ENCOUNTER — Other Ambulatory Visit: Payer: Self-pay | Admitting: Family Medicine

## 2015-09-23 MED ORDER — LOSARTAN POTASSIUM 25 MG PO TABS: 25.0000 mg | ORAL_TABLET | Freq: Every day | ORAL | 1 refills | Status: DC

## 2015-09-24 ENCOUNTER — Other Ambulatory Visit: Payer: Medicare Other

## 2015-09-25 ENCOUNTER — Other Ambulatory Visit (INDEPENDENT_AMBULATORY_CARE_PROVIDER_SITE_OTHER): Payer: Medicare Other

## 2015-09-25 DIAGNOSIS — E785 Hyperlipidemia, unspecified: Secondary | ICD-10-CM

## 2015-09-26 LAB — LIPID PANEL
Cholesterol: 238 mg/dL — ABNORMAL HIGH (ref 0–200)
HDL: 41.3 mg/dL (ref >39.00)
NonHDL: 196.92
Total CHOL/HDL Ratio: 6
Triglycerides: 221 mg/dL — ABNORMAL HIGH (ref 0.0–149.0)
VLDL: 44.2 mg/dL — ABNORMAL HIGH (ref 0.0–40.0)

## 2015-09-26 LAB — LDL CHOLESTEROL, DIRECT: LDL DIRECT: 177 mg/dL

## 2015-09-27 ENCOUNTER — Other Ambulatory Visit: Payer: Self-pay | Admitting: Family Medicine

## 2015-09-27 ENCOUNTER — Ambulatory Visit: Payer: Medicare Other | Admitting: Family Medicine

## 2015-09-27 MED ORDER — AMLODIPINE BESYLATE 5 MG PO TABS: 5.0000 mg | ORAL_TABLET | Freq: Every day | ORAL | 1 refills | Status: DC

## 2015-10-01 ENCOUNTER — Ambulatory Visit: Payer: Medicare Other | Admitting: Family Medicine

## 2015-10-02 DIAGNOSIS — M5114 Intervertebral disc disorders with radiculopathy, thoracic region: Secondary | ICD-10-CM | POA: Diagnosis not present

## 2015-10-02 DIAGNOSIS — M9902 Segmental and somatic dysfunction of thoracic region: Secondary | ICD-10-CM | POA: Diagnosis not present

## 2015-10-21 ENCOUNTER — Ambulatory Visit (INDEPENDENT_AMBULATORY_CARE_PROVIDER_SITE_OTHER): Payer: Medicare Other | Admitting: Family Medicine

## 2015-10-21 ENCOUNTER — Encounter: Payer: Self-pay | Admitting: Family Medicine

## 2015-10-21 VITALS — BP 130/70 | HR 66 | Temp 98.0°F | Ht 68.0 in | Wt 221.5 lb

## 2015-10-21 DIAGNOSIS — I10 Essential (primary) hypertension: Secondary | ICD-10-CM

## 2015-10-21 DIAGNOSIS — E785 Hyperlipidemia, unspecified: Secondary | ICD-10-CM

## 2015-10-21 DIAGNOSIS — R739 Hyperglycemia, unspecified: Secondary | ICD-10-CM

## 2015-10-21 DIAGNOSIS — Z23 Encounter for immunization: Secondary | ICD-10-CM

## 2015-10-21 DIAGNOSIS — K219 Gastro-esophageal reflux disease without esophagitis: Secondary | ICD-10-CM

## 2015-10-21 MED ORDER — AMLODIPINE BESYLATE 5 MG PO TABS: 5.0000 mg | ORAL_TABLET | Freq: Every day | ORAL | 1 refills | Status: DC

## 2015-10-21 MED ORDER — LOSARTAN POTASSIUM 25 MG PO TABS: 25.0000 mg | ORAL_TABLET | Freq: Every day | ORAL | 1 refills | Status: DC

## 2015-10-21 NOTE — Patient Instructions (Signed)
Hypertension Hypertension, commonly called high blood pressure, is when the force of blood pumping through your arteries is too strong. Your arteries are the blood vessels that carry blood from your heart throughout your body. A blood pressure reading consists of a higher number over a lower number, such as 110/72. The higher number (systolic) is the pressure inside your arteries when your heart pumps. The lower number (diastolic) is the pressure inside your arteries when your heart relaxes. Ideally you want your blood pressure below 120/80. Hypertension forces your heart to work harder to pump blood. Your arteries may become narrow or stiff. Having untreated or uncontrolled hypertension can cause heart attack, stroke, kidney disease, and other problems. RISK FACTORS Some risk factors for high blood pressure are controllable. Others are not.  Risk factors you cannot control include:   Race. You may be at higher risk if you are African American.  Age. Risk increases with age.  Gender. Men are at higher risk than women before age 45 years. After age 65, women are at higher risk than men. Risk factors you can control include:  Not getting enough exercise or physical activity.  Being overweight.  Getting too much fat, sugar, calories, or salt in your diet.  Drinking too much alcohol. SIGNS AND SYMPTOMS Hypertension does not usually cause signs or symptoms. Extremely high blood pressure (hypertensive crisis) may cause headache, anxiety, shortness of breath, and nosebleed. DIAGNOSIS To check if you have hypertension, your health care provider will measure your blood pressure while you are seated, with your arm held at the level of your heart. It should be measured at least twice using the same arm. Certain conditions can cause a difference in blood pressure between your right and left arms. A blood pressure reading that is higher than normal on one occasion does not mean that you need treatment. If  it is not clear whether you have high blood pressure, you may be asked to return on a different day to have your blood pressure checked again. Or, you may be asked to monitor your blood pressure at home for 1 or more weeks. TREATMENT Treating high blood pressure includes making lifestyle changes and possibly taking medicine. Living a healthy lifestyle can help lower high blood pressure. You may need to change some of your habits. Lifestyle changes may include:  Following the DASH diet. This diet is high in fruits, vegetables, and whole grains. It is low in salt, red meat, and added sugars.  Keep your sodium intake below 2,300 mg per day.  Getting at least 30-45 minutes of aerobic exercise at least 4 times per week.  Losing weight if necessary.  Not smoking.  Limiting alcoholic beverages.  Learning ways to reduce stress. Your health care provider may prescribe medicine if lifestyle changes are not enough to get your blood pressure under control, and if one of the following is true:  You are 18-59 years of age and your systolic blood pressure is above 140.  You are 60 years of age or older, and your systolic blood pressure is above 150.  Your diastolic blood pressure is above 90.  You have diabetes, and your systolic blood pressure is over 140 or your diastolic blood pressure is over 90.  You have kidney disease and your blood pressure is above 140/90.  You have heart disease and your blood pressure is above 140/90. Your personal target blood pressure may vary depending on your medical conditions, your age, and other factors. HOME CARE INSTRUCTIONS    Have your blood pressure rechecked as directed by your health care provider.   Take medicines only as directed by your health care provider. Follow the directions carefully. Blood pressure medicines must be taken as prescribed. The medicine does not work as well when you skip doses. Skipping doses also puts you at risk for  problems.  Do not smoke.   Monitor your blood pressure at home as directed by your health care provider. SEEK MEDICAL CARE IF:   You think you are having a reaction to medicines taken.  You have recurrent headaches or feel dizzy.  You have swelling in your ankles.  You have trouble with your vision. SEEK IMMEDIATE MEDICAL CARE IF:  You develop a severe headache or confusion.  You have unusual weakness, numbness, or feel faint.  You have severe chest or abdominal pain.  You vomit repeatedly.  You have trouble breathing. MAKE SURE YOU:   Understand these instructions.  Will watch your condition.  Will get help right away if you are not doing well or get worse.   This information is not intended to replace advice given to you by your health care provider. Make sure you discuss any questions you have with your health care provider.   Document Released: 01/19/2005 Document Revised: 06/05/2014 Document Reviewed: 11/11/2012 Elsevier Interactive Patient Education 2016 Elsevier Inc.  

## 2015-10-21 NOTE — Progress Notes (Signed)
Pre visit review using our clinic review tool, if applicable. No additional management support is needed unless otherwise documented below in the visit note. 

## 2015-10-22 ENCOUNTER — Telehealth: Payer: Self-pay | Admitting: Family Medicine

## 2015-10-22 NOTE — Telephone Encounter (Signed)
°  Relationship to patient: Self Can be reached: [phone]    Reason for call: Request refill on amLODipine (NORVASC) 5 MG tablet [id number]  (States that Dr. Charlett Blake is suppose to make a call today for him this morning and he wanted to remind her)

## 2015-10-22 NOTE — Telephone Encounter (Signed)
Called mail order to confirm they did receive both refill request yesterday for amlodipine and losartan.  Both are being processed and will be mailed asap by mail order.   Patient informed

## 2015-10-30 DIAGNOSIS — M5114 Intervertebral disc disorders with radiculopathy, thoracic region: Secondary | ICD-10-CM | POA: Diagnosis not present

## 2015-10-30 DIAGNOSIS — M9902 Segmental and somatic dysfunction of thoracic region: Secondary | ICD-10-CM | POA: Diagnosis not present
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
## 2015-11-02 NOTE — Assessment & Plan Note (Signed)
Doing well on omeprazole, avoid offending foods.

## 2015-11-02 NOTE — Assessment & Plan Note (Signed)
hgba1c acceptable, minimize simple carbs. Increase exercise as tolerated.  

## 2015-11-02 NOTE — Assessment & Plan Note (Signed)
Encouraged heart healthy diet, increase exercise, avoid trans fats, consider a krill oil cap daily 

## 2015-11-02 NOTE — Assessment & Plan Note (Signed)
Well controlled, no changes to meds. Encouraged heart healthy diet such as the DASH diet and exercise as tolerated.  °

## 2015-11-11 DIAGNOSIS — L03032 Cellulitis of left toe: Secondary | ICD-10-CM | POA: Diagnosis not present

## 2015-11-11 DIAGNOSIS — M79672 Pain in left foot: Secondary | ICD-10-CM | POA: Diagnosis not present

## 2015-11-25 ENCOUNTER — Other Ambulatory Visit: Payer: Self-pay | Admitting: Family Medicine

## 2015-11-25 MED ORDER — FENOFIBRATE MICRONIZED 134 MG PO CAPS
134.0000 mg | ORAL_CAPSULE | ORAL | 2 refills | Status: DC
Start: 2015-11-25 — End: 2016-01-09

## 2015-11-26 DIAGNOSIS — D0462 Carcinoma in situ of skin of left upper limb, including shoulder: Secondary | ICD-10-CM | POA: Diagnosis not present

## 2015-11-26 DIAGNOSIS — D485 Neoplasm of uncertain behavior of skin: Secondary | ICD-10-CM | POA: Diagnosis not present

## 2015-11-26 DIAGNOSIS — L820 Inflamed seborrheic keratosis: Secondary | ICD-10-CM | POA: Diagnosis not present

## 2015-11-26 DIAGNOSIS — Z85828 Personal history of other malignant neoplasm of skin: Secondary | ICD-10-CM | POA: Diagnosis not present

## 2015-11-26 DIAGNOSIS — C44311 Basal cell carcinoma of skin of nose: Secondary | ICD-10-CM | POA: Diagnosis not present

## 2015-11-26 DIAGNOSIS — D1801 Hemangioma of skin and subcutaneous tissue: Secondary | ICD-10-CM | POA: Diagnosis not present

## 2015-11-26 DIAGNOSIS — L821 Other seborrheic keratosis: Secondary | ICD-10-CM | POA: Diagnosis not present

## 2015-11-27 DIAGNOSIS — M5114 Intervertebral disc disorders with radiculopathy, thoracic region: Secondary | ICD-10-CM | POA: Diagnosis not present

## 2015-11-27 DIAGNOSIS — M9902 Segmental and somatic dysfunction of thoracic region: Secondary | ICD-10-CM | POA: Diagnosis not present

## 2015-12-10 ENCOUNTER — Other Ambulatory Visit: Payer: Medicare Other

## 2015-12-11 ENCOUNTER — Other Ambulatory Visit: Payer: Medicare Other

## 2015-12-17 ENCOUNTER — Ambulatory Visit: Payer: Medicare Other | Admitting: Family Medicine

## 2015-12-18 DIAGNOSIS — C44311 Basal cell carcinoma of skin of nose: Secondary | ICD-10-CM | POA: Diagnosis not present

## 2015-12-18 DIAGNOSIS — Z85828 Personal history of other malignant neoplasm of skin: Secondary | ICD-10-CM | POA: Diagnosis not present

## 2015-12-19 ENCOUNTER — Ambulatory Visit: Payer: Medicare Other | Admitting: Family Medicine

## 2015-12-25 DIAGNOSIS — M9902 Segmental and somatic dysfunction of thoracic region: Secondary | ICD-10-CM | POA: Diagnosis not present

## 2015-12-25 DIAGNOSIS — M5114 Intervertebral disc disorders with radiculopathy, thoracic region: Secondary | ICD-10-CM | POA: Diagnosis not present

## 2016-01-03 ENCOUNTER — Other Ambulatory Visit (INDEPENDENT_AMBULATORY_CARE_PROVIDER_SITE_OTHER): Payer: Medicare Other

## 2016-01-03 DIAGNOSIS — E785 Hyperlipidemia, unspecified: Secondary | ICD-10-CM

## 2016-01-03 DIAGNOSIS — R739 Hyperglycemia, unspecified: Secondary | ICD-10-CM

## 2016-01-03 DIAGNOSIS — I10 Essential (primary) hypertension: Secondary | ICD-10-CM | POA: Diagnosis not present

## 2016-01-03 LAB — CBC
HEMATOCRIT: 42.5 % (ref 38.5–50.0)
Hemoglobin: 14 g/dL (ref 13.2–17.1)
MCH: 29.7 pg (ref 27.0–33.0)
MCHC: 32.9 g/dL (ref 32.0–36.0)
MCV: 90 fL (ref 80.0–100.0)
MPV: 10.9 fL (ref 7.5–12.5)
PLATELETS: 199 10*3/uL (ref 140–400)
RBC: 4.72 MIL/uL (ref 4.20–5.80)
RDW: 14 % (ref 11.0–15.0)
WBC: 8.5 10*3/uL (ref 3.8–10.8)

## 2016-01-03 LAB — TSH: TSH: 1.44 mIU/L (ref 0.40–4.50)

## 2016-01-03 NOTE — Addendum Note (Signed)
Addended by: Caffie Pinto on: 01/03/2016 02:56 PM   Modules accepted: Orders

## 2016-01-04 LAB — LIPID PANEL
CHOL/HDL RATIO: 7.2 ratio — AB (ref <5.0)
CHOLESTEROL: 245 mg/dL — AB (ref <200)
HDL: 34 mg/dL — AB (ref >40)
LDL Cholesterol: 162 mg/dL — ABNORMAL HIGH (ref <100)
Triglycerides: 243 mg/dL — ABNORMAL HIGH (ref <150)
VLDL: 49 mg/dL — ABNORMAL HIGH (ref <30)

## 2016-01-04 LAB — HEMOGLOBIN A1C
Hgb A1c MFr Bld: 6 % — ABNORMAL HIGH (ref <5.7)
MEAN PLASMA GLUCOSE: 126 mg/dL

## 2016-01-04 LAB — COMPREHENSIVE METABOLIC PANEL
ALBUMIN: 3.8 g/dL (ref 3.6–5.1)
ALT: 18 U/L (ref 9–46)
AST: 19 U/L (ref 10–35)
Alkaline Phosphatase: 51 U/L (ref 40–115)
BUN: 23 mg/dL (ref 7–25)
CHLORIDE: 106 mmol/L (ref 98–110)
CO2: 22 mmol/L (ref 20–31)
CREATININE: 1.11 mg/dL (ref 0.70–1.18)
Calcium: 9.4 mg/dL (ref 8.6–10.3)
Glucose, Bld: 132 mg/dL — ABNORMAL HIGH (ref 65–99)
Potassium: 3.8 mmol/L (ref 3.5–5.3)
SODIUM: 140 mmol/L (ref 135–146)
Total Bilirubin: 0.4 mg/dL (ref 0.2–1.2)
Total Protein: 6.4 g/dL (ref 6.1–8.1)

## 2016-01-09 ENCOUNTER — Ambulatory Visit (INDEPENDENT_AMBULATORY_CARE_PROVIDER_SITE_OTHER): Payer: Medicare Other | Admitting: Family Medicine

## 2016-01-09 ENCOUNTER — Encounter: Payer: Self-pay | Admitting: Family Medicine

## 2016-01-09 VITALS — BP 128/66 | HR 59 | Temp 97.7°F | Wt 222.4 lb

## 2016-01-09 DIAGNOSIS — R739 Hyperglycemia, unspecified: Secondary | ICD-10-CM | POA: Diagnosis not present

## 2016-01-09 DIAGNOSIS — I10 Essential (primary) hypertension: Secondary | ICD-10-CM | POA: Diagnosis not present

## 2016-01-09 DIAGNOSIS — E6609 Other obesity due to excess calories: Secondary | ICD-10-CM | POA: Diagnosis not present

## 2016-01-09 DIAGNOSIS — E785 Hyperlipidemia, unspecified: Secondary | ICD-10-CM

## 2016-01-09 MED ORDER — FENOFIBRATE MICRONIZED 134 MG PO CAPS: 134.0000 mg | ORAL_CAPSULE | Freq: Every day | ORAL | 2 refills | Status: DC

## 2016-01-09 NOTE — Assessment & Plan Note (Addendum)
Encouraged DASH diet, decrease po intake and increase exercise as tolerated. Needs 7-8 hours of sleep nightly. Avoid trans fats, eat small, frequent meals every 4-5 hours with lean proteins, complex carbs and healthy fats. Minimize simple carbs 

## 2016-01-09 NOTE — Assessment & Plan Note (Signed)
Well controlled, no changes to meds. Encouraged heart healthy diet such as the DASH diet and exercise as tolerated.  °

## 2016-01-09 NOTE — Patient Instructions (Signed)
DASH Eating Plan DASH stands for "Dietary Approaches to Stop Hypertension." The DASH eating plan is a healthy eating plan that has been shown to reduce high blood pressure (hypertension). Additional health benefits may include reducing the risk of type 2 diabetes mellitus, heart disease, and stroke. The DASH eating plan may also help with weight loss. What do I need to know about the DASH eating plan? For the DASH eating plan, you will follow these general guidelines:  Choose foods with less than 150 milligrams of sodium per serving (as listed on the food label).  Use salt-free seasonings or herbs instead of table salt or sea salt.  Check with your health care provider or pharmacist before using salt substitutes.  Eat lower-sodium products. These are often labeled as "low-sodium" or "no salt added."  Eat fresh foods. Avoid eating a lot of canned foods.  Eat more vegetables, fruits, and low-fat dairy products.  Choose whole grains. Look for the word "whole" as the first word in the ingredient list.  Choose fish and skinless chicken or turkey more often than red meat. Limit fish, poultry, and meat to 6 oz (170 g) each day.  Limit sweets, desserts, sugars, and sugary drinks.  Choose heart-healthy fats.  Eat more home-cooked food and less restaurant, buffet, and fast food.  Limit fried foods.  Do not fry foods. Cook foods using methods such as baking, boiling, grilling, and broiling instead.  When eating at a restaurant, ask that your food be prepared with less salt, or no salt if possible. What foods can I eat? Seek help from a dietitian for individual calorie needs. Grains  Whole grain or whole wheat bread. Brown rice. Whole grain or whole wheat pasta. Quinoa, bulgur, and whole grain cereals. Low-sodium cereals. Corn or whole wheat flour tortillas. Whole grain cornbread. Whole grain crackers. Low-sodium crackers. Vegetables  Fresh or frozen vegetables (raw, steamed, roasted, or  grilled). Low-sodium or reduced-sodium tomato and vegetable juices. Low-sodium or reduced-sodium tomato sauce and paste. Low-sodium or reduced-sodium canned vegetables. Fruits  All fresh, canned (in natural juice), or frozen fruits. Meat and Other Protein Products  Ground beef (85% or leaner), grass-fed beef, or beef trimmed of fat. Skinless chicken or turkey. Ground chicken or turkey. Pork trimmed of fat. All fish and seafood. Eggs. Dried beans, peas, or lentils. Unsalted nuts and seeds. Unsalted canned beans. Dairy  Low-fat dairy products, such as skim or 1% milk, 2% or reduced-fat cheeses, low-fat ricotta or cottage cheese, or plain low-fat yogurt. Low-sodium or reduced-sodium cheeses. Fats and Oils  Tub margarines without trans fats. Light or reduced-fat mayonnaise and salad dressings (reduced sodium). Avocado. Safflower, olive, or canola oils. Natural peanut or almond butter. Other  Unsalted popcorn and pretzels. The items listed above may not be a complete list of recommended foods or beverages. Contact your dietitian for more options.  What foods are not recommended? Grains  White bread. White pasta. White rice. Refined cornbread. Bagels and croissants. Crackers that contain trans fat. Vegetables  Creamed or fried vegetables. Vegetables in a cheese sauce. Regular canned vegetables. Regular canned tomato sauce and paste. Regular tomato and vegetable juices. Fruits  Canned fruit in light or heavy syrup. Fruit juice. Meat and Other Protein Products  Fatty cuts of meat. Ribs, chicken wings, bacon, sausage, bologna, salami, chitterlings, fatback, hot dogs, bratwurst, and packaged luncheon meats. Salted nuts and seeds. Canned beans with salt. Dairy  Whole or 2% milk, cream, half-and-half, and cream cheese. Whole-fat or sweetened yogurt. Full-fat cheeses   or blue cheese. Nondairy creamers and whipped toppings. Processed cheese, cheese spreads, or cheese curds. Condiments  Onion and garlic  salt, seasoned salt, table salt, and sea salt. Canned and packaged gravies. Worcestershire sauce. Tartar sauce. Barbecue sauce. Teriyaki sauce. Soy sauce, including reduced sodium. Steak sauce. Fish sauce. Oyster sauce. Cocktail sauce. Horseradish. Ketchup and mustard. Meat flavorings and tenderizers. Bouillon cubes. Hot sauce. Tabasco sauce. Marinades. Taco seasonings. Relishes. Fats and Oils  Butter, stick margarine, lard, shortening, ghee, and bacon fat. Coconut, palm kernel, or palm oils. Regular salad dressings. Other  Pickles and olives. Salted popcorn and pretzels. The items listed above may not be a complete list of foods and beverages to avoid. Contact your dietitian for more information.  Where can I find more information? National Heart, Lung, and [employer] Institute: www.nhlbi.nih.gov/health/health-topics/topics/dash/ This information is not intended to replace advice given to you by your health care provider. Make sure you discuss any questions you have with your health care provider. Document Released: 01/08/2011 Document Revised: 06/27/2015 Document Reviewed: 11/23/2012 Elsevier Interactive Patient Education  2017 Elsevier Inc.  
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
## 2016-01-09 NOTE — Assessment & Plan Note (Signed)
Encouraged heart healthy diet, increase exercise, avoid trans fats, consider a krill oil cap daily. Increase Fenofibrate to daily

## 2016-01-09 NOTE — Progress Notes (Signed)
Pre visit review using our clinic review tool, if applicable. No additional management support is needed unless otherwise documented below in the visit note. 

## 2016-01-22 DIAGNOSIS — M5114 Intervertebral disc disorders with radiculopathy, thoracic region: Secondary | ICD-10-CM | POA: Diagnosis not present

## 2016-01-22 DIAGNOSIS — M9902 Segmental and somatic dysfunction of thoracic region: Secondary | ICD-10-CM | POA: Diagnosis not present

## 2016-02-26 DIAGNOSIS — M5114 Intervertebral disc disorders with radiculopathy, thoracic region: Secondary | ICD-10-CM | POA: Diagnosis not present

## 2016-02-26 DIAGNOSIS — M9902 Segmental and somatic dysfunction of thoracic region: Secondary | ICD-10-CM | POA: Diagnosis not present

## 2016-03-17 ENCOUNTER — Other Ambulatory Visit: Payer: Self-pay | Admitting: Family Medicine

## 2016-03-17 MED ORDER — OMEPRAZOLE 20 MG PO CPDR: 20.0000 mg | DELAYED_RELEASE_CAPSULE | Freq: Every day | ORAL | 2 refills | Status: DC

## 2016-03-25 DIAGNOSIS — M9902 Segmental and somatic dysfunction of thoracic region: Secondary | ICD-10-CM | POA: Diagnosis not present

## 2016-03-25 DIAGNOSIS — M5114 Intervertebral disc disorders with radiculopathy, thoracic region: Secondary | ICD-10-CM | POA: Diagnosis not present

## 2016-03-30 ENCOUNTER — Other Ambulatory Visit: Payer: Self-pay | Admitting: Family Medicine

## 2016-03-30 DIAGNOSIS — I10 Essential (primary) hypertension: Secondary | ICD-10-CM

## 2016-03-30 MED ORDER — AMLODIPINE BESYLATE 5 MG PO TABS: 5.0000 mg | ORAL_TABLET | Freq: Every day | ORAL | 1 refills | Status: DC

## 2016-03-31 ENCOUNTER — Telehealth: Payer: Self-pay | Admitting: Family Medicine

## 2016-03-31 NOTE — Telephone Encounter (Signed)
Called patient to schedule awv. Patient did not answer. Will attempt to call patient again.  °

## 2016-04-22 DIAGNOSIS — M9902 Segmental and somatic dysfunction of thoracic region: Secondary | ICD-10-CM | POA: Diagnosis not present

## 2016-04-22 DIAGNOSIS — M5114 Intervertebral disc disorders with radiculopathy, thoracic region: Secondary | ICD-10-CM | POA: Diagnosis not present

## 2016-05-12 ENCOUNTER — Other Ambulatory Visit: Payer: Self-pay | Admitting: Family Medicine

## 2016-05-12 DIAGNOSIS — I10 Essential (primary) hypertension: Secondary | ICD-10-CM

## 2016-05-12 MED ORDER — LOSARTAN POTASSIUM 25 MG PO TABS: 25.0000 mg | ORAL_TABLET | Freq: Every day | ORAL | 1 refills | Status: DC

## 2016-05-20 DIAGNOSIS — M9902 Segmental and somatic dysfunction of thoracic region: Secondary | ICD-10-CM | POA: Diagnosis not present

## 2016-05-20 DIAGNOSIS — M5114 Intervertebral disc disorders with radiculopathy, thoracic region: Secondary | ICD-10-CM | POA: Diagnosis not present

## 2016-05-26 DIAGNOSIS — H2513 Age-related nuclear cataract, bilateral: Secondary | ICD-10-CM | POA: Diagnosis not present

## 2016-05-27 DIAGNOSIS — C4401 Basal cell carcinoma of skin of lip: Secondary | ICD-10-CM | POA: Diagnosis not present

## 2016-05-27 DIAGNOSIS — L570 Actinic keratosis: Secondary | ICD-10-CM | POA: Diagnosis not present

## 2016-05-27 DIAGNOSIS — C44319 Basal cell carcinoma of skin of other parts of face: Secondary | ICD-10-CM | POA: Diagnosis not present

## 2016-05-27 DIAGNOSIS — D1801 Hemangioma of skin and subcutaneous tissue: Secondary | ICD-10-CM | POA: Diagnosis not present

## 2016-05-27 DIAGNOSIS — L821 Other seborrheic keratosis: Secondary | ICD-10-CM | POA: Diagnosis not present

## 2016-05-27 DIAGNOSIS — Z85828 Personal history of other malignant neoplasm of skin: Secondary | ICD-10-CM | POA: Diagnosis not present

## 2016-05-27 DIAGNOSIS — D485 Neoplasm of uncertain behavior of skin: Secondary | ICD-10-CM | POA: Diagnosis not present

## 2016-05-27 DIAGNOSIS — D2372 Other benign neoplasm of skin of left lower limb, including hip: Secondary | ICD-10-CM | POA: Diagnosis not present

## 2016-06-17 DIAGNOSIS — M9902 Segmental and somatic dysfunction of thoracic region: Secondary | ICD-10-CM | POA: Diagnosis not present

## 2016-06-17 DIAGNOSIS — M5114 Intervertebral disc disorders with radiculopathy, thoracic region: Secondary | ICD-10-CM | POA: Diagnosis not present

## 2016-07-15 DIAGNOSIS — M5114 Intervertebral disc disorders with radiculopathy, thoracic region: Secondary | ICD-10-CM | POA: Diagnosis not present

## 2016-07-15 DIAGNOSIS — M9902 Segmental and somatic dysfunction of thoracic region: Secondary | ICD-10-CM | POA: Diagnosis not present

## 2016-07-16 ENCOUNTER — Ambulatory Visit: Payer: Medicare Other | Admitting: *Deleted

## 2016-07-24 DIAGNOSIS — M79609 Pain in unspecified limb: Secondary | ICD-10-CM | POA: Diagnosis not present

## 2016-07-24 DIAGNOSIS — B351 Tinea unguium: Secondary | ICD-10-CM | POA: Diagnosis not present

## 2016-08-11 ENCOUNTER — Ambulatory Visit (INDEPENDENT_AMBULATORY_CARE_PROVIDER_SITE_OTHER): Payer: Medicare Other | Admitting: Family Medicine

## 2016-08-11 ENCOUNTER — Encounter: Payer: Self-pay | Admitting: Family Medicine

## 2016-08-11 VITALS — BP 122/70 | HR 62 | Temp 98.2°F | Resp 18 | Wt 218.0 lb

## 2016-08-11 DIAGNOSIS — R0681 Apnea, not elsewhere classified: Secondary | ICD-10-CM

## 2016-08-11 DIAGNOSIS — R0683 Snoring: Secondary | ICD-10-CM

## 2016-08-11 DIAGNOSIS — R739 Hyperglycemia, unspecified: Secondary | ICD-10-CM | POA: Diagnosis not present

## 2016-08-11 DIAGNOSIS — I10 Essential (primary) hypertension: Secondary | ICD-10-CM

## 2016-08-11 DIAGNOSIS — E785 Hyperlipidemia, unspecified: Secondary | ICD-10-CM

## 2016-08-11 DIAGNOSIS — G4710 Hypersomnia, unspecified: Secondary | ICD-10-CM

## 2016-08-11 DIAGNOSIS — T7840XD Allergy, unspecified, subsequent encounter: Secondary | ICD-10-CM

## 2016-08-11 NOTE — Patient Instructions (Signed)
Increase the Claritin/Loratadine to 10 mg twice daily Mucinex plain twice daily x 10 days Flonase/Fluticasone nasal spray at bed time Sleep Apnea Sleep apnea is a condition that affects breathing. People with sleep apnea have moments during sleep when their breathing pauses briefly or gets shallow. Sleep apnea can cause these symptoms:  Trouble staying asleep.  Sleepiness or tiredness during the day.  Irritability.  Loud snoring.  Morning headaches.  Trouble concentrating.  Forgetting things.  Less interest in sex.  Being sleepy for no reason.  Mood swings.  Personality changes.  Depression.  Waking up a lot during the night to pee (urinate).  Dry mouth.  Sore throat.  Follow these instructions at home:  Make any changes in your routine that your doctor recommends.  Eat a healthy, well-balanced diet.  Take over-the-counter and prescription medicines only as told by your doctor.  Avoid using alcohol, calming medicines (sedatives), and narcotic medicines.  Take steps to lose weight if you are overweight.  If you were given a machine (device) to use while you sleep, use it only as told by your doctor.  Do not use any tobacco products, such as cigarettes, chewing tobacco, and e-cigarettes. If you need help quitting, ask your doctor.  Keep all follow-up visits as told by your doctor. This is important. Contact a doctor if:  The machine that you were given to use during sleep is uncomfortable or does not seem to be working.  Your symptoms do not get better.  Your symptoms get worse. Get help right away if:  Your chest hurts.  You have trouble breathing in enough air (shortness of breath).  You have an uncomfortable feeling in your back, arms, or stomach.  You have trouble talking.  One side of your body feels weak.  A part of your face is hanging down (drooping). These symptoms may be an emergency. Do not wait to see if the symptoms will go away. Get  medical help right away. Call your local emergency services (911 in the [place] Do not drive yourself to the hospital. This information is not intended to replace advice given to you by your health care provider. Make sure you discuss any questions you have with your health care provider. Document Released: 10/29/2007 Document Revised: 09/15/2015 Document Reviewed: 10/29/2014 Elsevier Interactive Patient Education  [employer].

## 2016-08-11 NOTE — Assessment & Plan Note (Signed)
Encouraged heart healthy diet, increase exercise, avoid trans fats, consider a krill oil cap daily 
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
## 2016-08-11 NOTE — Assessment & Plan Note (Signed)
minimize simple carbs. Increase exercise as tolerated.  

## 2016-08-11 NOTE — Assessment & Plan Note (Signed)
Well controlled, no changes to meds. Encouraged heart healthy diet such as the DASH diet and exercise as tolerated.  °

## 2016-08-12 ENCOUNTER — Encounter: Payer: Self-pay | Admitting: Family Medicine

## 2016-08-12 ENCOUNTER — Other Ambulatory Visit (INDEPENDENT_AMBULATORY_CARE_PROVIDER_SITE_OTHER): Payer: Medicare Other

## 2016-08-12 DIAGNOSIS — R0681 Apnea, not elsewhere classified: Secondary | ICD-10-CM | POA: Insufficient documentation

## 2016-08-12 DIAGNOSIS — M9902 Segmental and somatic dysfunction of thoracic region: Secondary | ICD-10-CM | POA: Diagnosis not present

## 2016-08-12 DIAGNOSIS — I10 Essential (primary) hypertension: Secondary | ICD-10-CM | POA: Diagnosis not present

## 2016-08-12 DIAGNOSIS — R0683 Snoring: Secondary | ICD-10-CM | POA: Insufficient documentation

## 2016-08-12 DIAGNOSIS — R739 Hyperglycemia, unspecified: Secondary | ICD-10-CM

## 2016-08-12 DIAGNOSIS — T7840XA Allergy, unspecified, initial encounter: Secondary | ICD-10-CM

## 2016-08-12 DIAGNOSIS — E785 Hyperlipidemia, unspecified: Secondary | ICD-10-CM

## 2016-08-12 DIAGNOSIS — M5114 Intervertebral disc disorders with radiculopathy, thoracic region: Secondary | ICD-10-CM | POA: Diagnosis not present

## 2016-08-12 HISTORY — DX: Allergy, unspecified, initial encounter: T78.40XA

## 2016-08-12 LAB — HEMOGLOBIN A1C: Hgb A1c MFr Bld: 6.4 % (ref 4.6–6.5)

## 2016-08-12 LAB — TSH: TSH: 1.92 u[IU]/mL (ref 0.35–4.50)

## 2016-08-12 LAB — LIPID PANEL
CHOLESTEROL: 264 mg/dL — AB (ref 0–200)
HDL: 43.8 mg/dL (ref >39.00)
LDL CALC: 197 mg/dL — AB (ref 0–99)
NonHDL: 220.14
TRIGLYCERIDES: 114 mg/dL (ref 0.0–149.0)
Total CHOL/HDL Ratio: 6
VLDL: 22.8 mg/dL (ref 0.0–40.0)

## 2016-08-12 LAB — CBC
HEMATOCRIT: 42.3 % (ref 39.0–52.0)
Hemoglobin: 14.5 g/dL (ref 13.0–17.0)
MCHC: 34.3 g/dL (ref 30.0–36.0)
MCV: 88.5 fl (ref 78.0–100.0)
Platelets: 198 10*3/uL (ref 150.0–400.0)
RBC: 4.78 Mil/uL (ref 4.22–5.81)
RDW: 13.6 % (ref 11.5–15.5)
WBC: 7.1 10*3/uL (ref 4.0–10.5)

## 2016-08-12 LAB — COMPREHENSIVE METABOLIC PANEL
ALBUMIN: 4.1 g/dL (ref 3.5–5.2)
ALT: 24 U/L (ref 0–53)
AST: 21 U/L (ref 0–37)
Alkaline Phosphatase: 48 U/L (ref 39–117)
BUN: 17 mg/dL (ref 6–23)
CALCIUM: 10.6 mg/dL — AB (ref 8.4–10.5)
CHLORIDE: 104 meq/L (ref 96–112)
CO2: 28 meq/L (ref 19–32)
Creatinine, Ser: 1.04 mg/dL (ref 0.40–1.50)
GFR: 73.22 mL/min (ref >60.00)
Glucose, Bld: 116 mg/dL — ABNORMAL HIGH (ref 70–99)
POTASSIUM: 3.8 meq/L (ref 3.5–5.1)
Sodium: 139 mEq/L (ref 135–145)
Total Bilirubin: 0.4 mg/dL (ref 0.2–1.2)
Total Protein: 7.1 g/dL (ref 6.0–8.3)

## 2016-08-12 NOTE — Assessment & Plan Note (Signed)
Snoring, restless sleep, excessive fatigue is referred for sleep study.

## 2016-08-12 NOTE — Assessment & Plan Note (Signed)
claritin bid, flonase daily, mucinex bid x 10 days, nasal saline prn

## 2016-08-13 ENCOUNTER — Other Ambulatory Visit: Payer: Self-pay | Admitting: Family Medicine

## 2016-08-13 ENCOUNTER — Other Ambulatory Visit (INDEPENDENT_AMBULATORY_CARE_PROVIDER_SITE_OTHER): Payer: Medicare Other

## 2016-08-13 DIAGNOSIS — E785 Hyperlipidemia, unspecified: Secondary | ICD-10-CM

## 2016-08-13 LAB — VITAMIN D 25 HYDROXY (VIT D DEFICIENCY, FRACTURES): VITD: 36.82 ng/mL (ref 30.00–100.00)

## 2016-08-13 MED ORDER — PITAVASTATIN CALCIUM 1 MG PO TABS: ORAL_TABLET | ORAL | 3 refills | Status: DC

## 2016-08-20 ENCOUNTER — Encounter: Payer: Self-pay | Admitting: Medical

## 2016-08-20 ENCOUNTER — Ambulatory Visit (INDEPENDENT_AMBULATORY_CARE_PROVIDER_SITE_OTHER): Payer: Medicare Other | Admitting: Medical

## 2016-08-20 VITALS — BP 144/72 | HR 76 | Temp 98.2°F | Resp 16 | Ht 68.0 in | Wt 219.0 lb

## 2016-08-20 DIAGNOSIS — B49 Unspecified mycosis: Secondary | ICD-10-CM | POA: Diagnosis not present

## 2016-08-20 MED ORDER — NYSTATIN 100000 UNIT/GM EX CREA: 1.0000 "application " | TOPICAL_CREAM | Freq: Two times a day (BID) | CUTANEOUS | 1 refills | Status: DC

## 2016-08-20 NOTE — Patient Instructions (Signed)
Left groin rash appears to be fungal. Rx nystatin to use twice daily. Keep area dry.  On review borderline diabetic. Low sugar diet as high sugars can allow fungus to grow easier.  Follow up in 2 weeks or as needed(any persisting or worsening rash)
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
## 2016-08-21 ENCOUNTER — Other Ambulatory Visit: Payer: Self-pay

## 2016-08-26 ENCOUNTER — Other Ambulatory Visit (INDEPENDENT_AMBULATORY_CARE_PROVIDER_SITE_OTHER): Payer: Medicare Other

## 2016-08-26 DIAGNOSIS — Z85828 Personal history of other malignant neoplasm of skin: Secondary | ICD-10-CM | POA: Diagnosis not present

## 2016-08-26 DIAGNOSIS — E785 Hyperlipidemia, unspecified: Secondary | ICD-10-CM

## 2016-08-26 DIAGNOSIS — L821 Other seborrheic keratosis: Secondary | ICD-10-CM | POA: Diagnosis not present

## 2016-08-26 LAB — VITAMIN D 25 HYDROXY (VIT D DEFICIENCY, FRACTURES): VITD: 33.27 ng/mL (ref 30.00–100.00)

## 2016-08-26 LAB — COMPREHENSIVE METABOLIC PANEL
ALBUMIN: 4 g/dL (ref 3.5–5.2)
ALT: 25 U/L (ref 0–53)
AST: 22 U/L (ref 0–37)
Alkaline Phosphatase: 48 U/L (ref 39–117)
BUN: 13 mg/dL (ref 6–23)
CALCIUM: 10.3 mg/dL (ref 8.4–10.5)
CHLORIDE: 104 meq/L (ref 96–112)
CO2: 29 mEq/L (ref 19–32)
CREATININE: 1 mg/dL (ref 0.40–1.50)
GFR: 76.6 mL/min (ref >60.00)
Glucose, Bld: 128 mg/dL — ABNORMAL HIGH (ref 70–99)
Potassium: 3.7 mEq/L (ref 3.5–5.1)
Sodium: 138 mEq/L (ref 135–145)
Total Bilirubin: 0.4 mg/dL (ref 0.2–1.2)
Total Protein: 7.1 g/dL (ref 6.0–8.3)

## 2016-08-27 LAB — PTH, INTACT AND CALCIUM
Calcium: 9.7 mg/dL (ref 8.6–10.3)
PTH: 17 pg/mL (ref 14–64)

## 2016-08-31 ENCOUNTER — Ambulatory Visit (INDEPENDENT_AMBULATORY_CARE_PROVIDER_SITE_OTHER): Payer: Medicare Other | Admitting: Medical

## 2016-08-31 ENCOUNTER — Encounter: Payer: Self-pay | Admitting: Medical

## 2016-08-31 ENCOUNTER — Other Ambulatory Visit: Payer: Self-pay | Admitting: Medical

## 2016-08-31 VITALS — BP 120/70 | HR 101 | Temp 98.7°F | Resp 16 | Ht 68.0 in | Wt 217.0 lb

## 2016-08-31 DIAGNOSIS — R05 Cough: Secondary | ICD-10-CM

## 2016-08-31 DIAGNOSIS — J40 Bronchitis, not specified as acute or chronic: Secondary | ICD-10-CM | POA: Diagnosis not present

## 2016-08-31 DIAGNOSIS — R059 Cough, unspecified: Secondary | ICD-10-CM

## 2016-08-31 DIAGNOSIS — J301 Allergic rhinitis due to pollen: Secondary | ICD-10-CM

## 2016-08-31 MED ORDER — CEFDINIR 300 MG PO CAPS: 300.0000 mg | ORAL_CAPSULE | Freq: Two times a day (BID) | ORAL | 0 refills | Status: DC

## 2016-08-31 MED ORDER — LEVOCETIRIZINE DIHYDROCHLORIDE 5 MG PO TABS: 5.0000 mg | ORAL_TABLET | Freq: Every evening | ORAL | 0 refills | Status: DC

## 2016-08-31 MED ORDER — BENZONATATE 100 MG PO CAPS: 100.0000 mg | ORAL_CAPSULE | Freq: Three times a day (TID) | ORAL | 0 refills | Status: DC | PRN

## 2016-08-31 MED ORDER — FLUTICASONE PROPIONATE 50 MCG/ACT NA SUSP: 2.0000 | Freq: Every day | NASAL | 1 refills | Status: DC

## 2016-08-31 NOTE — Patient Instructions (Addendum)
You appear to have bronchitis. Rest hydrate and tylenol for fever. I am prescribing cough medicine benzonatate, and cefdnir antibiotic. For your nasal congestion you could use otc the counter nasal steroid flonase.(rx given if less expensive)  For history of allergies you could try xyzal instead of claritin.   You should gradually get better. If not then notify us and would recommend a chest xray.  Note extensive allergy history and could not give doxycycline or azithromycin based on allergy history. But if symptoms worsen would need to consider rocephin IM.  BP controlled today.  Follow up in 7-10 days or as needed
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
## 2016-09-04 ENCOUNTER — Encounter (HOSPITAL_BASED_OUTPATIENT_CLINIC_OR_DEPARTMENT_OTHER): Payer: Self-pay

## 2016-09-04 ENCOUNTER — Emergency Department (HOSPITAL_BASED_OUTPATIENT_CLINIC_OR_DEPARTMENT_OTHER)
Admission: EM | Admit: 2016-09-04 | Discharge: 2016-09-04 | Disposition: A | Discharge status: Home / Self Care | Payer: Medicare Other | Attending: Emergency Medicine | Admitting: Emergency Medicine

## 2016-09-04 DIAGNOSIS — Z79899 Other long term (current) drug therapy: Secondary | ICD-10-CM | POA: Insufficient documentation

## 2016-09-04 DIAGNOSIS — H1031 Unspecified acute conjunctivitis, right eye: Secondary | ICD-10-CM | POA: Diagnosis not present

## 2016-09-04 DIAGNOSIS — Z87891 Personal history of nicotine dependence: Secondary | ICD-10-CM | POA: Diagnosis not present

## 2016-09-04 DIAGNOSIS — I10 Essential (primary) hypertension: Secondary | ICD-10-CM | POA: Insufficient documentation

## 2016-09-04 DIAGNOSIS — H1011 Acute atopic conjunctivitis, right eye: Secondary | ICD-10-CM | POA: Insufficient documentation

## 2016-09-04 DIAGNOSIS — H1131 Conjunctival hemorrhage, right eye: Secondary | ICD-10-CM | POA: Insufficient documentation

## 2016-09-04 MED ORDER — BACITRACIN-POLYMYXIN B 500-10000 UNIT/GM OP OINT: 1.0000 "application " | TOPICAL_OINTMENT | Freq: Four times a day (QID) | OPHTHALMIC | 0 refills | Status: AC

## 2016-09-04 MED ORDER — FLUORESCEIN SODIUM 0.6 MG OP STRP
ORAL_STRIP | OPHTHALMIC | Status: AC
  Administered 2016-09-04: 1 via OPHTHALMIC
  Filled 2016-09-04: qty 1

## 2016-09-04 MED ORDER — FLUORESCEIN SODIUM 0.6 MG OP STRP
1.0000 | ORAL_STRIP | Freq: Once | OPHTHALMIC | Status: AC
  Administered 2016-09-04: 1 via OPHTHALMIC

## 2016-09-04 MED ORDER — TETRACAINE HCL 0.5 % OP SOLN
2.0000 [drp] | Freq: Once | OPHTHALMIC | Status: AC
  Administered 2016-09-04: 2 [drp] via OPHTHALMIC

## 2016-09-04 MED ORDER — TETRACAINE HCL 0.5 % OP SOLN
OPHTHALMIC | Status: AC
  Administered 2016-09-04: 2 [drp] via OPHTHALMIC
  Filled 2016-09-04: qty 4

## 2016-09-04 NOTE — ED Triage Notes (Addendum)
C/o blurred vision right eye this am that cleared-blurred vision returned 715p with blood in eye-no bleeding noted-wife has tissue from eye with scant amount of blood noted-denies injury-NAD-steady gait-pt is being treated for bronchitis-day 5 of abx
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
## 2016-09-04 NOTE — ED Notes (Signed)
ED Provider at bedside. 

## 2016-09-09 ENCOUNTER — Encounter: Payer: Self-pay | Admitting: Medical

## 2016-09-09 ENCOUNTER — Ambulatory Visit (INDEPENDENT_AMBULATORY_CARE_PROVIDER_SITE_OTHER): Payer: Medicare Other | Admitting: Medical

## 2016-09-09 VITALS — BP 133/76 | HR 66 | Temp 98.2°F | Resp 16 | Ht 68.0 in | Wt 214.8 lb

## 2016-09-09 DIAGNOSIS — R05 Cough: Secondary | ICD-10-CM | POA: Diagnosis not present

## 2016-09-09 DIAGNOSIS — J40 Bronchitis, not specified as acute or chronic: Secondary | ICD-10-CM

## 2016-09-09 DIAGNOSIS — H1131 Conjunctival hemorrhage, right eye: Secondary | ICD-10-CM | POA: Diagnosis not present

## 2016-09-09 DIAGNOSIS — R059 Cough, unspecified: Secondary | ICD-10-CM

## 2016-09-09 NOTE — Patient Instructions (Signed)
Bronchitis appears resolved as well as cough. Can continue current antibiotic until you complete the course.  For faint residual cough can you mucinex  Also continue your nasal spray.  Your subconjunctival hemorrhage appears clear now. If eye flares again notify us.  Follow up as regularly scheduled with pcp or as needed
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
## 2016-09-10 DIAGNOSIS — M9902 Segmental and somatic dysfunction of thoracic region: Secondary | ICD-10-CM | POA: Diagnosis not present

## 2016-09-10 DIAGNOSIS — M5114 Intervertebral disc disorders with radiculopathy, thoracic region: Secondary | ICD-10-CM | POA: Diagnosis not present

## 2016-09-22 ENCOUNTER — Other Ambulatory Visit: Payer: Self-pay

## 2016-09-22 DIAGNOSIS — I10 Essential (primary) hypertension: Secondary | ICD-10-CM

## 2016-09-22 MED ORDER — AMLODIPINE BESYLATE 5 MG PO TABS: 5.0000 mg | ORAL_TABLET | Freq: Every day | ORAL | 0 refills | Status: DC

## 2016-09-26 ENCOUNTER — Other Ambulatory Visit: Payer: Self-pay | Admitting: Medical

## 2016-10-06 ENCOUNTER — Ambulatory Visit (INDEPENDENT_AMBULATORY_CARE_PROVIDER_SITE_OTHER): Payer: Medicare Other

## 2016-10-06 DIAGNOSIS — Z23 Encounter for immunization: Secondary | ICD-10-CM

## 2016-10-06 NOTE — Progress Notes (Signed)
Patient in with his spouse and received his flu vaccine

## 2016-10-27 DIAGNOSIS — B351 Tinea unguium: Secondary | ICD-10-CM | POA: Diagnosis not present

## 2016-10-27 DIAGNOSIS — M79674 Pain in right toe(s): Secondary | ICD-10-CM | POA: Diagnosis not present

## 2016-10-28 ENCOUNTER — Encounter: Payer: Self-pay | Admitting: Pulmonary Disease

## 2016-10-28 ENCOUNTER — Ambulatory Visit (INDEPENDENT_AMBULATORY_CARE_PROVIDER_SITE_OTHER): Payer: Medicare Other | Admitting: Pulmonary Disease

## 2016-10-28 VITALS — BP 138/78 | HR 83 | Ht 68.0 in | Wt 205.8 lb

## 2016-10-28 DIAGNOSIS — R29818 Other symptoms and signs involving the nervous system: Secondary | ICD-10-CM | POA: Diagnosis not present

## 2016-10-28 NOTE — Patient Instructions (Signed)
Will arrange for home sleep study Will call to arrange for follow up after sleep study reviewed  

## 2016-10-28 NOTE — Progress Notes (Signed)
Past Surgical History He  has a past surgical history that includes Tonsillectomy and adenoidectomy and Prostatectomy (2009).  Allergies  Allergen Reactions  . Atorvastatin Other (See Comments)    Muscle cramps  . Clindamycin/Lincomycin Other (See Comments)    Hives.  . Erythromycin     REACTION: hives  . Levofloxacin     REACTION: joint swelling  . Lisinopril     cough  . Penicillins     ?unknown reaction  . Rosuvastatin Calcium   . Sulfonamide Derivatives     REACTION: swelling  . Tetracycline Hcl     REACTION: unspecified    Family History His family history includes Cancer in his cousin, father, and maternal uncle; Dementia in his mother; Diabetes (age of onset: 63) in his maternal grandmother; Kidney disease in his maternal aunt.  Social History He  reports that he quit smoking about 55 years ago. His smoking use included Pipe. He has never used smokeless tobacco. He reports that he drinks about 4.2 oz of alcohol per week . He reports that he does not use drugs.  Review of systems  Constitutional: Negative for fever and unexpected weight change.  HENT: Positive for congestion and postnasal drip. Negative for dental problem, nosebleeds, rhinorrhea, sinus pressure, sneezing, sore throat and trouble swallowing.   Eyes: Negative for redness and itching.  Respiratory: Negative for cough, chest tightness, shortness of breath and wheezing.   Cardiovascular: Negative for palpitations and leg swelling.  Gastrointestinal: Negative for nausea and vomiting.  Genitourinary: Negative for dysuria.  Musculoskeletal: Negative for joint swelling.  Skin: Negative for rash.  Allergic/Immunologic: Negative.  Negative for environmental allergies, food allergies and immunocompromised state.  Neurological: Negative for headaches.  Hematological: Does not bruise/bleed easily.  Psychiatric/Behavioral: Negative for dysphoric mood. The patient is not nervous/anxious.     Current Outpatient  Prescriptions on File Prior to Visit  Medication Sig  . amLODipine (NORVASC) 5 MG tablet Take 1 tablet (5 mg total) by mouth daily.  . Ascorbic Acid (VITAMIN C) 500 MG tablet Take 500 mg by mouth daily.    . Cholecalciferol (VITAMIN D PO) Take 1 capsule by mouth daily.  . fenofibrate micronized (LOFIBRA) 134 MG capsule Take 1 capsule (134 mg total) by mouth daily before breakfast.  . loratadine (CLARITIN) 10 MG tablet Take 10 mg by mouth daily as needed for allergies.   Marland Kitchen losartan (COZAAR) 25 MG tablet Take 1 tablet (25 mg total) by mouth daily.  . multivitamin (THERAGRAN) per tablet Take 1 tablet by mouth daily.    Marland Kitchen omeprazole (PRILOSEC) 20 MG capsule Take 1 capsule (20 mg total) by mouth daily.  . Pitavastatin Calcium (LIVALO) 1 MG TABS Take 1 by mouth every other day  . potassium phosphate, monobasic, (K-PHOS ORIGINAL) 500 MG tablet Take 500 mg by mouth daily.   No current facility-administered medications on file prior to visit.     Chief Complaint  Patient presents with  . Sleep Consult    Pt referred by Dr. Willette Alma, MD. Pt snores loudly, up 2-3 times each night, sleeps on avg 6-7 hrs each night. Pt wakes up with dry mouth some occasions, has some sleepiness during the day. Takes a nap after lunch.    Cardiac tests Echo 04/24/15 >> EF 55 to 60%, grade 1 DD  Past medical history He  has a past medical history of ADENOCARCINOMA, PROSTATE (10/26/2007); Allergic state (08/12/2016); COLONIC POLYPS, HX OF (08/26/2006); GERD (04/25/2007); H/O fracture of skull; H/O measles; H/O  mumps; History of chicken pox; HYPERLIPIDEMIA (11/09/2006); Hypertension; Medicare annual wellness visit, subsequent (03/12/2015); and Rectal lesion (08/26/2006).  Vital signs BP 138/78 (BP Location: Left Arm, Cuff Size: Normal)   Pulse 83   Ht 5\' 8"  (1.727 m)   Wt 205 lb 12.8 oz (93.4 kg)   SpO2 97%   BMI 31.29 kg/m   History of Present Illness Matthew [name] is a 81 y.o. male for evaluation of sleep  problems.  He has noticed feeling more tired during the day.  He snores, and his wife has been concerned about his breathing at night.  He doesn't sleep on his back.  He hardly dreams anymore.  He has been told he grinds his teeth.  He usually takes a nap in the afternoon.  He goes to sleep at 11 pm.  He falls asleep quickly.  He wakes up several times to use the bathroom.  He gets out of bed at 7 am.  He feels tired in the morning.  He denies morning headache.  He does not use anything to help him fall sleep or stay awake.  He denies sleep walking, sleep talking, or nightmares.  There is no history of restless legs.  He denies sleep hallucinations, sleep paralysis, or cataplexy.  The Epworth score is 5 out of 24.   Physical Exam:  General - No distress ENT - No sinus tenderness, no oral exudate, no LAN, no thyromegaly, TM clear, pupils equal/reactive, over bite Cardiac - s1s2 regular, no murmur, pulses symmetric Chest - No wheeze/rales/dullness, good air entry, normal respiratory excursion Back - No focal tenderness Abd - Soft, non-tender, no organomegaly, + bowel sounds Ext - No edema Neuro - Normal strength, cranial nerves intact Skin - No rashes Psych - Normal mood, and behavior  Discussion: He has snoring, sleep disruption, apnea, and daytime sleepiness.  He has history of hypertension.  I am concerned he could have sleep apnea.  We discussed how sleep apnea can affect various health problems, including risks for hypertension, cardiovascular disease, and diabetes.  We also discussed how sleep disruption can increase risks for accidents, such as while driving.  Weight loss as a means of improving sleep apnea was also reviewed.  Additional treatment options discussed were CPAP therapy, oral appliance, and surgical intervention.   Assessment/plan:  Suspected sleep apnea. - will arrange for home sleep study to further assess   Patient Instructions  Will arrange for home sleep  study Will call to arrange for follow up after sleep study reviewed     Chesley Mires, M.D. Pager [phone] 10/28/2016, 5:38 PM

## 2016-10-28 NOTE — Progress Notes (Signed)
   Subjective:    Patient ID: Matthew [name], male    DOB: [birth year], 81 y.o.   MRN: [id number]  HPI    Review of Systems  Constitutional: Negative for fever and unexpected weight change.  HENT: Positive for congestion and postnasal drip. Negative for dental problem, nosebleeds, rhinorrhea, sinus pressure, sneezing, sore throat and trouble swallowing.   Eyes: Negative for redness and itching.  Respiratory: Negative for cough, chest tightness, shortness of breath and wheezing.   Cardiovascular: Negative for palpitations and leg swelling.  Gastrointestinal: Negative for nausea and vomiting.  Genitourinary: Negative for dysuria.  Musculoskeletal: Negative for joint swelling.  Skin: Negative for rash.  Allergic/Immunologic: Negative.  Negative for environmental allergies, food allergies and immunocompromised state.  Neurological: Negative for headaches.  Hematological: Does not bruise/bleed easily.  Psychiatric/Behavioral: Negative for dysphoric mood. The patient is not nervous/anxious.        Objective:   Physical Exam        Assessment & Plan:

## 2016-11-06 ENCOUNTER — Other Ambulatory Visit: Payer: Self-pay

## 2016-11-06 MED ORDER — FENOFIBRATE MICRONIZED 134 MG PO CAPS: 134.0000 mg | ORAL_CAPSULE | Freq: Every day | ORAL | 2 refills | Status: DC

## 2016-11-09 DIAGNOSIS — N528 Other male erectile dysfunction: Secondary | ICD-10-CM | POA: Diagnosis not present

## 2016-11-09 DIAGNOSIS — N393 Stress incontinence (female) (male): Secondary | ICD-10-CM | POA: Diagnosis not present

## 2016-11-09 DIAGNOSIS — Z8546 Personal history of malignant neoplasm of prostate: Secondary | ICD-10-CM | POA: Diagnosis not present

## 2016-11-09 DIAGNOSIS — C61 Malignant neoplasm of prostate: Secondary | ICD-10-CM | POA: Diagnosis not present

## 2016-11-11 ENCOUNTER — Ambulatory Visit: Payer: Medicare Other

## 2016-11-25 DIAGNOSIS — G4733 Obstructive sleep apnea (adult) (pediatric): Secondary | ICD-10-CM | POA: Diagnosis not present

## 2016-11-26 ENCOUNTER — Telehealth: Payer: Self-pay | Admitting: Pulmonary Disease

## 2016-11-26 DIAGNOSIS — G4733 Obstructive sleep apnea (adult) (pediatric): Secondary | ICD-10-CM

## 2016-11-26 NOTE — Telephone Encounter (Signed)
HST 11/25/16 >> AHI 50.4, SaO2 low 70%.   Will have my nurse inform pt that sleep study shows severe sleep apnea.  Options are 1) CPAP now, 2) ROV first.  If He is agreeable to CPAP, then please send order for auto CPAP range 5 to 15 cm H2O with heated humidity and mask of choice.  Have download sent 1 month after starting CPAP and set up ROV 2 months after starting CPAP.  ROV can be with me or NP.

## 2016-12-01 ENCOUNTER — Other Ambulatory Visit: Payer: Self-pay | Admitting: *Deleted

## 2016-12-01 DIAGNOSIS — L570 Actinic keratosis: Secondary | ICD-10-CM | POA: Diagnosis not present

## 2016-12-01 DIAGNOSIS — L821 Other seborrheic keratosis: Secondary | ICD-10-CM | POA: Diagnosis not present

## 2016-12-01 DIAGNOSIS — R29818 Other symptoms and signs involving the nervous system: Secondary | ICD-10-CM

## 2016-12-01 DIAGNOSIS — D2372 Other benign neoplasm of skin of left lower limb, including hip: Secondary | ICD-10-CM | POA: Diagnosis not present

## 2016-12-01 DIAGNOSIS — D1801 Hemangioma of skin and subcutaneous tissue: Secondary | ICD-10-CM | POA: Diagnosis not present

## 2016-12-01 DIAGNOSIS — Z85828 Personal history of other malignant neoplasm of skin: Secondary | ICD-10-CM | POA: Diagnosis not present

## 2016-12-02 NOTE — Telephone Encounter (Signed)
Called and spoke with patient today regarding results per vs.  Informed the patient of his results today and recommendations per vs. The patient verbalized understanding and denied any questions or concerns at this time.   Patient is not wanting the CPAP machine at this conversation, he is wanting to think about it more and set up ROV. It is set for Dec 11 2016 at [place] to VS for review.

## 2016-12-02 NOTE — Telephone Encounter (Signed)
Noted  

## 2016-12-11 ENCOUNTER — Telehealth: Payer: Self-pay

## 2016-12-11 DIAGNOSIS — I10 Essential (primary) hypertension: Secondary | ICD-10-CM

## 2016-12-11 MED ORDER — AMLODIPINE BESYLATE 5 MG PO TABS: 5.0000 mg | ORAL_TABLET | Freq: Every day | ORAL | 0 refills | Status: DC

## 2016-12-11 NOTE — Telephone Encounter (Signed)
Patient scheduled with PCP for 01/19/2017

## 2016-12-11 NOTE — Telephone Encounter (Signed)
Please schedule patient a follow up appointment

## 2016-12-18 ENCOUNTER — Other Ambulatory Visit: Payer: Self-pay

## 2016-12-18 DIAGNOSIS — I10 Essential (primary) hypertension: Secondary | ICD-10-CM

## 2016-12-18 MED ORDER — LOSARTAN POTASSIUM 25 MG PO TABS: 25.0000 mg | ORAL_TABLET | Freq: Every day | ORAL | 1 refills | Status: DC

## 2016-12-23 ENCOUNTER — Other Ambulatory Visit: Payer: Self-pay | Admitting: Family Medicine

## 2016-12-23 ENCOUNTER — Telehealth: Payer: Self-pay | Admitting: Family Medicine

## 2016-12-23 ENCOUNTER — Other Ambulatory Visit: Payer: Self-pay

## 2016-12-23 DIAGNOSIS — I10 Essential (primary) hypertension: Secondary | ICD-10-CM

## 2016-12-23 MED ORDER — LOSARTAN POTASSIUM 25 MG PO TABS: 25.0000 mg | ORAL_TABLET | Freq: Every day | ORAL | 0 refills | Status: DC

## 2016-12-23 NOTE — Telephone Encounter (Signed)
Copied from [place] [id number]. Topic: Quick Communication - See Telephone Encounter >> Dec 23, 2016  7:50 AM Synthia Innocent wrote: CRM for notification. See Telephone encounter for:  Refill  12/23/16.

## 2016-12-23 NOTE — Telephone Encounter (Signed)
Patient states that refill on losartan was just sent yesterday to [facility] mail order, need atleast 10 days worth sent to [facility] on [address] in [place]. Patient only has 2 pills left. Please advise

## 2016-12-23 NOTE — Telephone Encounter (Signed)
Pt. Is waiting for his mail order Losartan to arrive - request 10 tablets be called to his local pharmacy.

## 2016-12-30 ENCOUNTER — Ambulatory Visit (INDEPENDENT_AMBULATORY_CARE_PROVIDER_SITE_OTHER): Payer: Medicare Other | Admitting: Pulmonary Disease

## 2016-12-30 ENCOUNTER — Encounter: Payer: Self-pay | Admitting: Pulmonary Disease

## 2016-12-30 VITALS — BP 118/80 | HR 54 | Ht 68.0 in | Wt 214.6 lb

## 2016-12-30 DIAGNOSIS — G4733 Obstructive sleep apnea (adult) (pediatric): Secondary | ICD-10-CM | POA: Diagnosis not present

## 2016-12-30 DIAGNOSIS — J310 Chronic rhinitis: Secondary | ICD-10-CM | POA: Diagnosis not present

## 2016-12-30 MED ORDER — FLUTICASONE PROPIONATE 50 MCG/ACT NA SUSP: 1.0000 | Freq: Every day | NASAL | 2 refills | Status: DC

## 2016-12-30 NOTE — Progress Notes (Signed)
  Current Outpatient Medications on File Prior to Visit  Medication Sig  . amLODipine (NORVASC) 5 MG tablet Take 1 tablet (5 mg total) daily by mouth.  . Ascorbic Acid (VITAMIN C) 500 MG tablet Take 500 mg by mouth daily.    . Cholecalciferol (VITAMIN D PO) Take 1 capsule by mouth daily.  . fenofibrate micronized (LOFIBRA) 134 MG capsule Take 1 capsule (134 mg total) by mouth daily before breakfast.  . loratadine (CLARITIN) 10 MG tablet Take 10 mg by mouth daily as needed for allergies.   Marland Kitchen losartan (COZAAR) 25 MG tablet Take 1 tablet (25 mg total) by mouth daily.  . multivitamin (THERAGRAN) per tablet Take 1 tablet by mouth daily.    Marland Kitchen omeprazole (PRILOSEC) 20 MG capsule Take 1 capsule (20 mg total) by mouth daily.  . Pitavastatin Calcium (LIVALO) 1 MG TABS Take 1 by mouth every other day  . potassium phosphate, monobasic, (K-PHOS ORIGINAL) 500 MG tablet Take 500 mg by mouth daily.   No current facility-administered medications on file prior to visit.     Chief Complaint  Patient presents with  . Follow-up    Pt here today to discuss cpap machine, and some sinus issues. When patient lays down at night nose is always clogged on one side and has productive cough- yellow mucus    Sleep tests HST 11/25/16 >> AHI 50.4, SaO2 low 70%.  Cardiac tests Echo 04/24/15 >> EF 55 to 60%, grade 1 DD  Past medical history Prostate cancer, Allergies, gERD, HLD, HTN  Past surgical history, Family history, Social history, Allergies reviewed  Vital signs BP 118/80 (BP Location: Left Arm, Cuff Size: Normal)   Pulse (!) 54   Ht 5\' 8"  (1.727 m)   Wt 214 lb 9.6 oz (97.3 kg)   SpO2 94%   BMI 32.63 kg/m   History of Present Illness Matthew [name] is a 81 y.o. male with obstructive sleep apnea.  Since his last visit he had home sleep study.  Showed severe sleep apnea.  He has trouble breathing through his nose.  He gets sinus congestion and post nasal drip.  He has used saline rinse and flonase  intermittently.  Physical Exam:  General - pleasant Eyes - pupils reactive ENT - no sinus tenderness, no oral exudate, no LAN, over bite Cardiac - regular, no murmur Chest - no wheeze, rales Abd - soft, non tender Ext - no edema Skin - no rashes Neuro - normal strength Psych - normal mood   Assessment/plan:  Obstructive sleep apnea. - We discussed how sleep apnea can affect various health problems, including risks for hypertension, cardiovascular disease, and diabetes.  We also discussed how sleep disruption can increase risks for accidents, such as while driving.  Weight loss as a means of improving sleep apnea was also reviewed.  Additional treatment options discussed were CPAP therapy, oral appliance, and surgical intervention. - will arrange for auto CPAP set up  Chronic rhinitis. - advised him to use nasal irrigation and flonase   Patient Instructions  Saline nasal spray daily Flonase 1 spray in each nostril daily Will arrange for CPAP set up  Follow up in 2 months    Chesley Mires, M.D. Pager [phone] 12/30/2016, 9:23 AM

## 2016-12-30 NOTE — Patient Instructions (Signed)
Saline nasal spray daily Flonase 1 spray in each nostril daily Will arrange for CPAP set up  Follow up in 2 months

## 2017-01-01 ENCOUNTER — Other Ambulatory Visit: Payer: Self-pay | Admitting: Family Medicine

## 2017-01-01 MED ORDER — OMEPRAZOLE 20 MG PO CPDR: 20.0000 mg | DELAYED_RELEASE_CAPSULE | Freq: Every day | ORAL | 2 refills | Status: DC

## 2017-01-01 NOTE — Telephone Encounter (Signed)
Rx sent to [facility]

## 2017-01-01 NOTE — Telephone Encounter (Signed)
Copied from [place] [id number]. Topic: Quick Communication - See Telephone Encounter >> Jan 01, 2017 11:28 AM Ether Griffins B wrote: CRM for notification. See Telephone encounter for:  Refill on omeprazole  01/01/17.

## 2017-01-13 ENCOUNTER — Telehealth: Payer: Self-pay | Admitting: Pulmonary Disease

## 2017-01-13 DIAGNOSIS — G4733 Obstructive sleep apnea (adult) (pediatric): Secondary | ICD-10-CM

## 2017-01-13 NOTE — Telephone Encounter (Signed)
Auto CPAP 12/14/16 to 01/12/17 >> used on 7 of 30 nights with average 1 hr 21 min.  Average AHI 22.6 with median CPAP 11 and 95 th percentile CPAP 15 cm H2O.   Please send order to change CPAP to range of 5 to 10 cm H2O.

## 2017-01-13 NOTE — Telephone Encounter (Signed)
Spoke with patient. He is aware of the CPAP settings change. Advised patient that I would send in the new order today to [facility]. He verbalized understanding.   Nothing else needed at time of call.

## 2017-01-13 NOTE — Telephone Encounter (Signed)
Spoke with patient. He stated that the air coming out of his machine is way too powerful for him. He is only able to sleep an hour or so with machine. He wants to know if VS could send an order to [facility] to have the settings lowered.   Advised patient that I would print out a download and VS review it. Patient verbalized understanding.   VS, please advise. Thanks!

## 2017-01-19 ENCOUNTER — Ambulatory Visit (INDEPENDENT_AMBULATORY_CARE_PROVIDER_SITE_OTHER): Payer: Medicare Other | Admitting: Family Medicine

## 2017-01-19 ENCOUNTER — Encounter: Payer: Self-pay | Admitting: Family Medicine

## 2017-01-19 DIAGNOSIS — E785 Hyperlipidemia, unspecified: Secondary | ICD-10-CM | POA: Diagnosis not present

## 2017-01-19 DIAGNOSIS — I10 Essential (primary) hypertension: Secondary | ICD-10-CM | POA: Diagnosis not present

## 2017-01-19 DIAGNOSIS — J069 Acute upper respiratory infection, unspecified: Secondary | ICD-10-CM | POA: Diagnosis not present

## 2017-01-19 DIAGNOSIS — G4733 Obstructive sleep apnea (adult) (pediatric): Secondary | ICD-10-CM | POA: Diagnosis not present

## 2017-01-19 DIAGNOSIS — R739 Hyperglycemia, unspecified: Secondary | ICD-10-CM | POA: Diagnosis not present

## 2017-01-19 DIAGNOSIS — E6609 Other obesity due to excess calories: Secondary | ICD-10-CM | POA: Diagnosis not present

## 2017-01-19 LAB — COMPREHENSIVE METABOLIC PANEL
ALBUMIN: 4.2 g/dL (ref 3.5–5.2)
ALT: 21 U/L (ref 0–53)
AST: 22 U/L (ref 0–37)
Alkaline Phosphatase: 50 U/L (ref 39–117)
BILIRUBIN TOTAL: 0.5 mg/dL (ref 0.2–1.2)
BUN: 16 mg/dL (ref 6–23)
CALCIUM: 10 mg/dL (ref 8.4–10.5)
CHLORIDE: 100 meq/L (ref 96–112)
CO2: 31 meq/L (ref 19–32)
Creatinine, Ser: 0.94 mg/dL (ref 0.40–1.50)
GFR: 82.18 mL/min (ref >60.00)
Glucose, Bld: 99 mg/dL (ref 70–99)
Potassium: 3.9 mEq/L (ref 3.5–5.1)
Sodium: 137 mEq/L (ref 135–145)
Total Protein: 7.1 g/dL (ref 6.0–8.3)

## 2017-01-19 LAB — CBC
HCT: 44.1 % (ref 39.0–52.0)
HEMOGLOBIN: 14.9 g/dL (ref 13.0–17.0)
MCHC: 33.8 g/dL (ref 30.0–36.0)
MCV: 90 fl (ref 78.0–100.0)
PLATELETS: 205 10*3/uL (ref 150.0–400.0)
RBC: 4.9 Mil/uL (ref 4.22–5.81)
RDW: 13.7 % (ref 11.5–15.5)
WBC: 7.6 10*3/uL (ref 4.0–10.5)

## 2017-01-19 LAB — TSH: TSH: 1.74 u[IU]/mL (ref 0.35–4.50)

## 2017-01-19 LAB — LIPID PANEL
CHOL/HDL RATIO: 5
CHOLESTEROL: 231 mg/dL — AB (ref 0–200)
HDL: 42.1 mg/dL (ref >39.00)
LDL CALC: 156 mg/dL — AB (ref 0–99)
NonHDL: 189.02
TRIGLYCERIDES: 165 mg/dL — AB (ref 0.0–149.0)
VLDL: 33 mg/dL (ref 0.0–40.0)

## 2017-01-19 LAB — HEMOGLOBIN A1C: Hgb A1c MFr Bld: 6.3 % (ref 4.6–6.5)

## 2017-01-19 NOTE — Assessment & Plan Note (Signed)
Encouraged DASH diet, decrease po intake and increase exercise as tolerated. Needs 7-8 hours of sleep nightly. Avoid trans fats, eat small, frequent meals every 4-5 hours with lean proteins, complex carbs and healthy fats. Minimize simple carbs 

## 2017-01-19 NOTE — Patient Instructions (Signed)

## 2017-01-19 NOTE — Assessment & Plan Note (Signed)
Tolerating statin, encouraged heart healthy diet, avoid trans fats, minimize simple carbs and saturated fats. Increase exercise as tolerated 

## 2017-01-19 NOTE — Assessment & Plan Note (Signed)
Well controlled, no changes to meds. Encouraged heart healthy diet such as the DASH diet and exercise as tolerated.  °
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
## 2017-01-19 NOTE — Assessment & Plan Note (Signed)
hgba1c acceptable, minimize simple carbs. Increase exercise as tolerated. Continue current meds 

## 2017-01-20 DIAGNOSIS — J069 Acute upper respiratory infection, unspecified: Secondary | ICD-10-CM | POA: Insufficient documentation

## 2017-01-20 NOTE — Assessment & Plan Note (Signed)
Is now following with pulmonology and trying to wear his CPAP but he cannot tolerate it for more than 4 hours a night.

## 2017-01-20 NOTE — Assessment & Plan Note (Addendum)
Recovering and mild. Encouraged Mucinex, Zinc, Vit C, hydration and rest report if worsens symptoms

## 2017-01-27 ENCOUNTER — Telehealth: Payer: Self-pay | Admitting: Pulmonary Disease

## 2017-01-27 NOTE — Telephone Encounter (Signed)
ATC pt, no answer. Left message for pt to call back.  

## 2017-01-28 NOTE — Telephone Encounter (Signed)
Pt returning call. Cb is [phone]. Pt leaving at 11am and can be reached on cell number [phone].

## 2017-01-28 NOTE — Telephone Encounter (Signed)
Spoke with patient. He is aware of VS recs.   Nothing else needed at time of call.

## 2017-01-28 NOTE — Telephone Encounter (Signed)
He should use nasal irrigation nightly, and flonase 2 sprays in each nostril nightly.  Can send script for astelin two sprays in each nostril twice per day.

## 2017-01-28 NOTE — Telephone Encounter (Signed)
Spoke with pt, he states he thinks he has a sinus infection and unable to get 4 hours in at night even after the pressure change. He really thinks it is a sinus problem because he can only breathe out of one nostril. He is using saline solution and this has been effective but after an hour or so on the CPAP, one nostril gets stopped up. Can he use the nasal saline/Flonase  throughout the night to help relieve congestion. VS any suggestions, he states he will make an appt if need be.

## 2017-02-05 ENCOUNTER — Telehealth: Payer: Self-pay | Admitting: Pulmonary Disease

## 2017-02-05 DIAGNOSIS — G4733 Obstructive sleep apnea (adult) (pediatric): Secondary | ICD-10-CM

## 2017-02-05 NOTE — Telephone Encounter (Signed)
Spoke with pt. He is very frustrated with his CPAP. Reports that he is having issues with using it every night. We have changed his pressure and humidity on the machine but things aren't any better. He is still waking up several times with the mask being hot and his mouth being very dry. Pt has been using the nasal spray that VS gave him with no relief. He has been using a Neti Pot per VS instructions with no relief. Pt would like recommendations. I am going to speak to [facility] about this since VS is not available.  CY - please advise. Thanks.

## 2017-02-05 NOTE — Telephone Encounter (Signed)
Ask- [facility] please reduce CPAP to 5 cwp          Schedule CPAP desensitization visit with daytime sleep center tech  Dx OSA

## 2017-02-05 NOTE — Telephone Encounter (Signed)
Spoke with patient regarding CPAP concerns Advised pt of CY recommendations Pt verbalized understanding, had not further questions or concerns  Placed DME order for cpap setting change to 5cwp Placed CPAP desensitization visit with daytime sleep center tech for OSA per CY  Nothing further needed

## 2017-02-08 ENCOUNTER — Telehealth: Payer: Self-pay | Admitting: Internal Medicine

## 2017-02-08 DIAGNOSIS — G4733 Obstructive sleep apnea (adult) (pediatric): Secondary | ICD-10-CM

## 2017-02-08 NOTE — Telephone Encounter (Signed)
Order for cpap titration needs  Cancelled Placed order for mask fit for patient Nothing further needed

## 2017-02-09 ENCOUNTER — Telehealth: Payer: Self-pay | Admitting: Pulmonary Disease

## 2017-02-09 NOTE — Telephone Encounter (Signed)
Spoke with pt, states that he felt like his cpap pressure was too low last night.  Pt's pressure was decreased to 5cm on 02/05/17.  Pt is requesting that we keep the humidity the same but increase the pressure.    DL printed and placed in VS' look-at.    VS please advise.  Thanks!

## 2017-02-10 NOTE — Telephone Encounter (Signed)
Pt Home number [phone] Cell number [phone].

## 2017-02-10 NOTE — Telephone Encounter (Signed)
Spoke with pt, aware of VS' recs.  Verified appt date/time with pt.  Nothing further needed.

## 2017-02-10 NOTE — Telephone Encounter (Signed)
He is scheduled for ROV on 03/01/17.  Please ask him to keep this appointment so that we can discuss further options for treating his sleep apnea.

## 2017-02-10 NOTE — Telephone Encounter (Signed)
Pt is calling about his Cpap Mask fitting apt. Wants to make sure his fitting apt is not supposed to be before his visit w/Sood. OV w/Sood is on 1/28 and Fitting is 1/30. Please advise.

## 2017-02-10 NOTE — Telephone Encounter (Signed)
Spoke with pt. He is aware that we are still waiting on VS to review his download report.  VS - please advise. Thanks.

## 2017-02-10 NOTE — Telephone Encounter (Signed)
Spoke with patient. Explained to him VS' recs. Patient became even more frustrated and stated that he will give up on the CPAP machine. He does not want to continue with the CPAP titration study.   Advised patient that I would route message to VS to let him know.

## 2017-02-10 NOTE — Telephone Encounter (Signed)
He had minimal use of CPAP, so can't tell whether he needs pressure adjustment based on download.  He likely needs to have in lab titration study.  If he is okay with this, then please send order for CPAP titration study.

## 2017-02-16 DIAGNOSIS — L600 Ingrowing nail: Secondary | ICD-10-CM | POA: Diagnosis not present

## 2017-03-01 ENCOUNTER — Ambulatory Visit: Payer: Medicare Other | Admitting: Pulmonary Disease

## 2017-03-03 ENCOUNTER — Other Ambulatory Visit (HOSPITAL_BASED_OUTPATIENT_CLINIC_OR_DEPARTMENT_OTHER): Payer: Medicare Other

## 2017-03-04 DIAGNOSIS — H35033 Hypertensive retinopathy, bilateral: Secondary | ICD-10-CM | POA: Diagnosis not present

## 2017-03-04 DIAGNOSIS — H2513 Age-related nuclear cataract, bilateral: Secondary | ICD-10-CM | POA: Diagnosis not present

## 2017-03-04 DIAGNOSIS — H43813 Vitreous degeneration, bilateral: Secondary | ICD-10-CM | POA: Diagnosis not present

## 2017-03-04 DIAGNOSIS — H5213 Myopia, bilateral: Secondary | ICD-10-CM | POA: Diagnosis not present

## 2017-03-11 ENCOUNTER — Telehealth: Payer: Self-pay | Admitting: Family Medicine

## 2017-03-11 NOTE — Telephone Encounter (Signed)
Copied from [place]. Topic: Quick Communication - See Telephone Encounter >> Mar 11, 2017 12:54 PM Rosalin Hawking wrote: CRM for notification. See Telephone encounter for:  03/11/17.   Pt dropped off document to be filled out by provider and to leave a copy for pt's chart (document of Do not resuscitate order- with an envelope provided for provider to send to pt when ready- document in a yellow big envelope with spouse Rohnan Bartleson document also). Pt would like document to be mailed to home address (pt provided envelope) Document put at front office tray under providers name Charlett Blake)

## 2017-03-16 NOTE — Telephone Encounter (Signed)
Forwarded to provider for signatures/SLS 02/12

## 2017-04-09 ENCOUNTER — Other Ambulatory Visit: Payer: Self-pay | Admitting: *Deleted

## 2017-04-09 DIAGNOSIS — I10 Essential (primary) hypertension: Secondary | ICD-10-CM

## 2017-04-09 MED ORDER — AMLODIPINE BESYLATE 5 MG PO TABS: 5.0000 mg | ORAL_TABLET | Freq: Every day | ORAL | 0 refills | Status: DC

## 2017-04-20 ENCOUNTER — Ambulatory Visit: Payer: Medicare Other | Admitting: Family Medicine

## 2017-04-23 ENCOUNTER — Ambulatory Visit (INDEPENDENT_AMBULATORY_CARE_PROVIDER_SITE_OTHER): Payer: Medicare Other | Admitting: Family Medicine

## 2017-04-23 ENCOUNTER — Encounter: Payer: Self-pay | Admitting: Family Medicine

## 2017-04-23 DIAGNOSIS — E785 Hyperlipidemia, unspecified: Secondary | ICD-10-CM | POA: Diagnosis not present

## 2017-04-23 DIAGNOSIS — R739 Hyperglycemia, unspecified: Secondary | ICD-10-CM

## 2017-04-23 DIAGNOSIS — E6609 Other obesity due to excess calories: Secondary | ICD-10-CM

## 2017-04-23 DIAGNOSIS — T7840XD Allergy, unspecified, subsequent encounter: Secondary | ICD-10-CM | POA: Diagnosis not present

## 2017-04-23 DIAGNOSIS — G4733 Obstructive sleep apnea (adult) (pediatric): Secondary | ICD-10-CM | POA: Diagnosis not present

## 2017-04-23 DIAGNOSIS — I10 Essential (primary) hypertension: Secondary | ICD-10-CM

## 2017-04-23 LAB — TSH: TSH: 2.04 u[IU]/mL (ref 0.35–4.50)

## 2017-04-23 LAB — CBC
HCT: 44.1 % (ref 39.0–52.0)
HEMOGLOBIN: 14.9 g/dL (ref 13.0–17.0)
MCHC: 33.8 g/dL (ref 30.0–36.0)
MCV: 89.4 fl (ref 78.0–100.0)
PLATELETS: 196 10*3/uL (ref 150.0–400.0)
RBC: 4.94 Mil/uL (ref 4.22–5.81)
RDW: 13.8 % (ref 11.5–15.5)
WBC: 7.2 10*3/uL (ref 4.0–10.5)

## 2017-04-23 LAB — COMPREHENSIVE METABOLIC PANEL
ALK PHOS: 60 U/L (ref 39–117)
ALT: 22 U/L (ref 0–53)
AST: 21 U/L (ref 0–37)
Albumin: 4 g/dL (ref 3.5–5.2)
BUN: 18 mg/dL (ref 6–23)
CALCIUM: 10.4 mg/dL (ref 8.4–10.5)
CO2: 28 mEq/L (ref 19–32)
CREATININE: 1 mg/dL (ref 0.40–1.50)
Chloride: 105 mEq/L (ref 96–112)
GFR: 76.47 mL/min (ref >60.00)
GLUCOSE: 132 mg/dL — AB (ref 70–99)
Potassium: 4.4 mEq/L (ref 3.5–5.1)
Sodium: 141 mEq/L (ref 135–145)
TOTAL PROTEIN: 7.3 g/dL (ref 6.0–8.3)
Total Bilirubin: 0.4 mg/dL (ref 0.2–1.2)

## 2017-04-23 LAB — LIPID PANEL
Cholesterol: 254 mg/dL — ABNORMAL HIGH (ref 0–200)
HDL: 42.6 mg/dL (ref >39.00)
LDL Cholesterol: 185 mg/dL — ABNORMAL HIGH (ref 0–99)
NONHDL: 210.96
TRIGLYCERIDES: 129 mg/dL (ref 0.0–149.0)
Total CHOL/HDL Ratio: 6
VLDL: 25.8 mg/dL (ref 0.0–40.0)

## 2017-04-23 LAB — HEMOGLOBIN A1C: HEMOGLOBIN A1C: 6.5 % (ref 4.6–6.5)

## 2017-04-23 MED ORDER — AMLODIPINE BESYLATE 5 MG PO TABS: 5.0000 mg | ORAL_TABLET | Freq: Every day | ORAL | 3 refills | Status: DC

## 2017-04-23 NOTE — Assessment & Plan Note (Signed)
Is snoring less and breathing better with Fluticasone

## 2017-04-23 NOTE — Assessment & Plan Note (Addendum)
Is not wearing a CPAP mask. He is advised that not using it can contribute to dementia, stroke, CHF, afib etc. He notes he is currently snoring less and sleeping better with a new mattress and using saline solution and Fluticasone. 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

## 2017-04-23 NOTE — Assessment & Plan Note (Signed)
Encouraged DASH diet, decrease po intake and increase exercise as tolerated. Needs 7-8 hours of sleep nightly. Avoid trans fats, eat small, frequent meals every 4-5 hours with lean proteins, complex carbs and healthy fats. Minimize simple carbs, bariatric consideration

## 2017-04-23 NOTE — Assessment & Plan Note (Signed)
hgba1c acceptable, minimize simple carbs. Increase exercise as tolerated.  

## 2017-04-23 NOTE — Patient Instructions (Signed)
DASH or MIND diet Shingrix is the new shingles shot, 2 shots over 2-6 months at pharmacy [place] stands for "Dietary Approaches to Stop Hypertension." The DASH eating plan is a healthy eating plan that has been shown to reduce high blood pressure (hypertension). It may also reduce your risk for type 2 diabetes, heart disease, and stroke. The DASH eating plan may also help with weight loss. What are tips for following this plan? General guidelines  Avoid eating more than 2,300 mg (milligrams) of salt (sodium) a day. If you have hypertension, you may need to reduce your sodium intake to 1,500 mg a day.  Limit alcohol intake to no more than 1 drink a day for nonpregnant women and 2 drinks a day for men. One drink equals 12 oz of beer, 5 oz of wine, or 1 oz of hard liquor.  Work with your health care provider to maintain a healthy body weight or to lose weight. Ask what an ideal weight is for you.  Get at least 30 minutes of exercise that causes your heart to beat faster (aerobic exercise) most days of the week. Activities may include walking, swimming, or biking.  Work with your health care provider or diet and nutrition specialist (dietitian) to adjust your eating plan to your individual calorie needs. Reading food labels  Check food labels for the amount of sodium per serving. Choose foods with less than 5 percent of the Daily Value of sodium. Generally, foods with less than 300 mg of sodium per serving fit into this eating plan.  To find whole grains, look for the word "whole" as the first word in the ingredient list. Shopping  Buy products labeled as "low-sodium" or "no salt added."  Buy fresh foods. Avoid canned foods and premade or frozen meals. Cooking  Avoid adding salt when cooking. Use salt-free seasonings or herbs instead of table salt or sea salt. Check with your health care provider or pharmacist before using salt substitutes.  Do not fry foods. Cook foods using  healthy methods such as baking, boiling, grilling, and broiling instead.  Cook with heart-healthy oils, such as olive, canola, soybean, or sunflower oil. Meal planning   Eat a balanced diet that includes: ? 5 or more servings of fruits and vegetables each day. At each meal, try to fill half of your plate with fruits and vegetables. ? Up to 6-8 servings of whole grains each day. ? Less than 6 oz of lean meat, poultry, or fish each day. A 3-oz serving of meat is about the same size as a deck of cards. One egg equals 1 oz. ? 2 servings of low-fat dairy each day. ? A serving of nuts, seeds, or beans 5 times each week. ? Heart-healthy fats. Healthy fats called Omega-3 fatty acids are found in foods such as flaxseeds and coldwater fish, like sardines, salmon, and mackerel.  Limit how much you eat of the following: ? Canned or prepackaged foods. ? Food that is high in trans fat, such as fried foods. ? Food that is high in saturated fat, such as fatty meat. ? Sweets, desserts, sugary drinks, and other foods with added sugar. ? Full-fat dairy products.  Do not salt foods before eating.  Try to eat at least 2 vegetarian meals each week.  Eat more home-cooked food and less restaurant, buffet, and fast food.  When eating at a restaurant, ask that your food be prepared with less salt or no salt, if possible. What foods  are recommended? The items listed may not be a complete list. Talk with your dietitian about what dietary choices are best for you. Grains Whole-grain or whole-wheat bread. Whole-grain or whole-wheat pasta. Brown rice. [place]. Bulgur. Whole-grain and low-sodium cereals. Pita bread. Low-fat, low-sodium crackers. Whole-wheat flour tortillas. Vegetables Fresh or frozen vegetables (raw, steamed, roasted, or grilled). Low-sodium or reduced-sodium tomato and vegetable juice. Low-sodium or reduced-sodium tomato sauce and tomato paste. Low-sodium or reduced-sodium canned  vegetables. Fruits All fresh, dried, or frozen fruit. Canned fruit in natural juice (without added sugar). Meat and other protein foods Skinless chicken or [place]. Ground chicken or [place]. Pork with fat trimmed off. Fish and seafood. Egg whites. Dried beans, peas, or lentils. Unsalted nuts, nut butters, and seeds. Unsalted canned beans. Lean cuts of beef with fat trimmed off. Low-sodium, lean deli meat. Dairy Low-fat (1%) or fat-free (skim) milk. Fat-free, low-fat, or reduced-fat cheeses. Nonfat, low-sodium ricotta or cottage cheese. Low-fat or nonfat yogurt. Low-fat, low-sodium cheese. Fats and oils Soft margarine without trans fats. Vegetable oil. Low-fat, reduced-fat, or light mayonnaise and salad dressings (reduced-sodium). Canola, safflower, olive, soybean, and sunflower oils. Avocado. Seasoning and other foods Herbs. Spices. Seasoning mixes without salt. Unsalted popcorn and pretzels. Fat-free sweets. What foods are not recommended? The items listed may not be a complete list. Talk with your dietitian about what dietary choices are best for you. Grains Baked goods made with fat, such as croissants, muffins, or some breads. Dry pasta or rice meal packs. Vegetables Creamed or fried vegetables. Vegetables in a cheese sauce. Regular canned vegetables (not low-sodium or reduced-sodium). Regular canned tomato sauce and paste (not low-sodium or reduced-sodium). Regular tomato and vegetable juice (not low-sodium or reduced-sodium). Angie Fava. Olives. Fruits Canned fruit in a light or heavy syrup. Fried fruit. Fruit in cream or butter sauce. Meat and other protein foods Fatty cuts of meat. Ribs. Fried meat. Berniece Salines. Sausage. Bologna and other processed lunch meats. Salami. Fatback. Hotdogs. Bratwurst. Salted nuts and seeds. Canned beans with added salt. Canned or smoked fish. Whole eggs or egg yolks. Chicken or [place] with skin. Dairy Whole or 2% milk, cream, and half-and-half. Whole or full-fat  cream cheese. Whole-fat or sweetened yogurt. Full-fat cheese. Nondairy creamers. Whipped toppings. Processed cheese and cheese spreads. Fats and oils Butter. Stick margarine. Lard. Shortening. Ghee. Bacon fat. Tropical oils, such as coconut, palm kernel, or palm oil. Seasoning and other foods Salted popcorn and pretzels. Onion salt, garlic salt, seasoned salt, table salt, and sea salt. Worcestershire sauce. Tartar sauce. Barbecue sauce. Teriyaki sauce. Soy sauce, including reduced-sodium. Steak sauce. Canned and packaged gravies. Fish sauce. Oyster sauce. Cocktail sauce. Horseradish that you find on the shelf. Ketchup. Mustard. Meat flavorings and tenderizers. Bouillon cubes. Hot sauce and Tabasco sauce. Premade or packaged marinades. Premade or packaged taco seasonings. Relishes. Regular salad dressings. Where to find more information:  National Heart, Lung, and [place]: https://wilson-eaton.com/  American Heart Association: www.heart.org Summary  The DASH eating plan is a healthy eating plan that has been shown to reduce high blood pressure (hypertension). It may also reduce your risk for type 2 diabetes, heart disease, and stroke.  With the DASH eating plan, you should limit salt (sodium) intake to 2,300 mg a day. If you have hypertension, you may need to reduce your sodium intake to 1,500 mg a day.  When on the DASH eating plan, aim to eat more fresh fruits and vegetables, whole grains, lean proteins, low-fat dairy, and heart-healthy fats.  Work with your  health care provider or diet and nutrition specialist (dietitian) to adjust your eating plan to your individual calorie needs. This information is not intended to replace advice given to you by your health care provider. Make sure you discuss any questions you have with your health care provider. Document Released: 01/08/2011 Document Revised: 01/13/2016 Document Reviewed: 01/13/2016 Elsevier Interactive Patient Education  [employer].

## 2017-04-23 NOTE — Assessment & Plan Note (Signed)
Well controlled, no changes to meds. Encouraged heart healthy diet such as the DASH diet and exercise as tolerated.  °

## 2017-04-23 NOTE — Assessment & Plan Note (Signed)
Encouraged heart healthy diet, increase exercise, avoid trans fats, consider a krill oil cap daily 
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
## 2017-05-04 DIAGNOSIS — C44519 Basal cell carcinoma of skin of other part of trunk: Secondary | ICD-10-CM | POA: Diagnosis not present

## 2017-05-04 DIAGNOSIS — M79674 Pain in right toe(s): Secondary | ICD-10-CM | POA: Diagnosis not present

## 2017-05-04 DIAGNOSIS — L821 Other seborrheic keratosis: Secondary | ICD-10-CM | POA: Diagnosis not present

## 2017-05-04 DIAGNOSIS — C44619 Basal cell carcinoma of skin of left upper limb, including shoulder: Secondary | ICD-10-CM | POA: Diagnosis not present

## 2017-05-04 DIAGNOSIS — L570 Actinic keratosis: Secondary | ICD-10-CM | POA: Diagnosis not present

## 2017-05-04 DIAGNOSIS — M79675 Pain in left toe(s): Secondary | ICD-10-CM | POA: Diagnosis not present

## 2017-05-04 DIAGNOSIS — Z85828 Personal history of other malignant neoplasm of skin: Secondary | ICD-10-CM | POA: Diagnosis not present

## 2017-05-04 DIAGNOSIS — D225 Melanocytic nevi of trunk: Secondary | ICD-10-CM | POA: Diagnosis not present

## 2017-05-04 DIAGNOSIS — D485 Neoplasm of uncertain behavior of skin: Secondary | ICD-10-CM | POA: Diagnosis not present

## 2017-05-04 DIAGNOSIS — B351 Tinea unguium: Secondary | ICD-10-CM | POA: Diagnosis not present

## 2017-05-18 DIAGNOSIS — L600 Ingrowing nail: Secondary | ICD-10-CM | POA: Diagnosis not present

## 2017-06-01 DIAGNOSIS — Z85828 Personal history of other malignant neoplasm of skin: Secondary | ICD-10-CM | POA: Diagnosis not present

## 2017-06-01 DIAGNOSIS — L570 Actinic keratosis: Secondary | ICD-10-CM | POA: Diagnosis not present

## 2017-06-30 ENCOUNTER — Other Ambulatory Visit: Payer: Self-pay

## 2017-06-30 DIAGNOSIS — I10 Essential (primary) hypertension: Secondary | ICD-10-CM

## 2017-06-30 MED ORDER — LOSARTAN POTASSIUM 25 MG PO TABS: 25.0000 mg | ORAL_TABLET | Freq: Every day | ORAL | 0 refills | Status: DC

## 2017-07-05 ENCOUNTER — Telehealth: Payer: Self-pay | Admitting: Family Medicine

## 2017-07-05 DIAGNOSIS — I10 Essential (primary) hypertension: Secondary | ICD-10-CM

## 2017-07-05 MED ORDER — LOSARTAN POTASSIUM 25 MG PO TABS: 25.0000 mg | ORAL_TABLET | Freq: Every day | ORAL | 0 refills | Status: DC

## 2017-07-05 NOTE — Telephone Encounter (Signed)
Copied from [place] [id number]. Topic: Quick Communication - See Telephone Encounter >> Jul 05, 2017  8:03 AM Hewitt Shorts wrote: Pt is requesting that 10 more pills of his losartan  sent to his local pharmacy [facility] [address]  the original rx was sent to mail order and he only has one pill left   Best number [phone]

## 2017-07-12 ENCOUNTER — Telehealth: Payer: Self-pay | Admitting: Family Medicine

## 2017-07-12 MED ORDER — FENOFIBRATE MICRONIZED 134 MG PO CAPS: 134.0000 mg | ORAL_CAPSULE | Freq: Every day | ORAL | 2 refills | Status: DC

## 2017-07-12 NOTE — Telephone Encounter (Signed)
Copied from [place] [id number]. Topic: Quick Communication - Rx Refill/Question >> Jul 12, 2017  7:37 AM Robina Ade, Helene Kelp D wrote: Medication: fenofibrate micronized (LOFIBRA) 134 MG capsule  Has the patient contacted their pharmacy? Yes (Agent: If no, request that the patient contact the pharmacy for the refill.) (Agent: If yes, when and what did the pharmacy advise?)  Preferred Pharmacy (with phone number or street name): [place], [place]: Please be advised that RX refills may take up to 3 business days. We ask that you follow-up with your pharmacy.

## 2017-07-19 ENCOUNTER — Encounter: Payer: Self-pay | Admitting: Family Medicine

## 2017-07-19 ENCOUNTER — Telehealth: Payer: Self-pay | Admitting: Family Medicine

## 2017-07-19 ENCOUNTER — Ambulatory Visit (INDEPENDENT_AMBULATORY_CARE_PROVIDER_SITE_OTHER): Payer: Medicare Other | Admitting: Family Medicine

## 2017-07-19 DIAGNOSIS — R739 Hyperglycemia, unspecified: Secondary | ICD-10-CM | POA: Diagnosis not present

## 2017-07-19 DIAGNOSIS — E6609 Other obesity due to excess calories: Secondary | ICD-10-CM

## 2017-07-19 DIAGNOSIS — E785 Hyperlipidemia, unspecified: Secondary | ICD-10-CM

## 2017-07-19 DIAGNOSIS — I10 Essential (primary) hypertension: Secondary | ICD-10-CM | POA: Diagnosis not present

## 2017-07-19 MED ORDER — NYSTATIN 100000 UNIT/GM EX CREA: 1.0000 "application " | TOPICAL_CREAM | Freq: Two times a day (BID) | CUTANEOUS | 1 refills | Status: DC

## 2017-07-19 MED ORDER — FLUTICASONE PROPIONATE 50 MCG/ACT NA SUSP: 1.0000 | Freq: Every day | NASAL | 2 refills | Status: DC

## 2017-07-19 NOTE — Telephone Encounter (Signed)
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

## 2017-07-19 NOTE — Assessment & Plan Note (Signed)
Encouraged heart healthy diet, increase exercise, avoid trans fats, consider a krill oil cap daily. Chose not to start Livalo and made diet and exercise changes instead will check levels today

## 2017-07-19 NOTE — Telephone Encounter (Signed)
Copied from [place] [phone]. Topic: Quick Communication - See Telephone Encounter >> Jul 19, 2017 10:07 AM Alfredia Ferguson R wrote: Pt called in wanting to speak with DR.Charlett Blake about a website that was suppose to be atttached to his paperwork. Callback # [phone]

## 2017-07-19 NOTE — Assessment & Plan Note (Signed)
Well controlled, no changes to meds. Encouraged heart healthy diet such as the DASH diet and exercise as tolerated.  °

## 2017-07-19 NOTE — Patient Instructions (Addendum)
[employer] (vacation by rental by owner)  Shingrix is the new shingles shot 2 shots over 2-6 months at pharmacy. (flu like symptoms) Cholesterol Cholesterol is a white, waxy, fat-like substance that is needed by the human body in small amounts. The liver makes all the cholesterol we need. Cholesterol is carried from the liver by the blood through the blood vessels. Deposits of cholesterol (plaques) may build up on blood vessel (artery) walls. Plaques make the arteries narrower and stiffer. Cholesterol plaques increase the risk for heart attack and stroke. You cannot feel your cholesterol level even if it is very high. The only way to know that it is high is to have a blood test. Once you know your cholesterol levels, you should keep a record of the test results. Work with your health care provider to keep your levels in the desired range. What do the results mean?  Total cholesterol is a rough measure of all the cholesterol in your blood.  LDL (low-density lipoprotein) is the "bad" cholesterol. This is the type that causes plaque to build up on the artery walls. You want this level to be low.  HDL (high-density lipoprotein) is the "good" cholesterol because it cleans the arteries and carries the LDL away. You want this level to be high.  Triglycerides are fat that the body can either burn for energy or store. High levels are closely linked to heart disease. What are the desired levels of cholesterol?  Total cholesterol below 200.  LDL below 100 for people who are at risk, below 70 for people at very high risk.  HDL above 40 is good. A level of 60 or higher is considered to be protective against heart disease.  Triglycerides below 150. How can I lower my cholesterol? Diet Follow your diet program as told by your health care provider.  Choose fish or white meat chicken and [place], roasted or baked. Limit fatty cuts of red meat, fried foods, and processed meats, such as sausage and lunch  meats.  Eat lots of fresh fruits and vegetables.  Choose whole grains, beans, pasta, potatoes, and cereals.  Choose olive oil, corn oil, or canola oil, and use only small amounts.  Avoid butter, mayonnaise, shortening, or palm kernel oils.  Avoid foods with trans fats.  Drink skim or nonfat milk and eat low-fat or nonfat yogurt and cheeses. Avoid whole milk, cream, ice cream, egg yolks, and full-fat cheeses.  Healthier desserts include angel food cake, ginger snaps, animal crackers, hard candy, popsicles, and low-fat or nonfat frozen yogurt. Avoid pastries, cakes, pies, and cookies.  Exercise  Follow your exercise program as told by your health care provider. A regular program: ? Helps to decrease LDL and raise HDL. ? Helps with weight control.  Do things that increase your activity level, such as gardening, walking, and taking the stairs.  Ask your health care provider about ways that you can be more active in your daily life.  Medicine  Take over-the-counter and prescription medicines only as told by your health care provider. ? Medicine may be prescribed by your health care provider to help lower cholesterol and decrease the risk for heart disease. This is usually done if diet and exercise have failed to bring down cholesterol levels. ? If you have several risk factors, you may need medicine even if your levels are normal.  This information is not intended to replace advice given to you by your health care provider. Make sure you discuss any questions you have with  your health care provider. Document Released: 10/14/2000 Document Revised: 08/17/2015 Document Reviewed: 07/20/2015 Elsevier Interactive Patient Education  [employer].

## 2017-07-19 NOTE — Assessment & Plan Note (Signed)
hgba1c acceptable, minimize simple carbs. Increase exercise as tolerated.  

## 2017-07-19 NOTE — Assessment & Plan Note (Signed)
Encouraged DASH diet, decrease po intake and increase exercise as tolerated. Needs 7-8 hours of sleep nightly. Avoid trans fats, eat small, frequent meals every 4-5 hours with lean proteins, complex carbs and healthy fats. Minimize simple carbs, good weight loss since last visit. Five pounds total
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
## 2017-07-26 ENCOUNTER — Other Ambulatory Visit (INDEPENDENT_AMBULATORY_CARE_PROVIDER_SITE_OTHER): Payer: Medicare Other

## 2017-07-26 DIAGNOSIS — R739 Hyperglycemia, unspecified: Secondary | ICD-10-CM | POA: Diagnosis not present

## 2017-07-26 DIAGNOSIS — E785 Hyperlipidemia, unspecified: Secondary | ICD-10-CM

## 2017-07-26 DIAGNOSIS — I10 Essential (primary) hypertension: Secondary | ICD-10-CM | POA: Diagnosis not present

## 2017-07-26 LAB — COMPREHENSIVE METABOLIC PANEL
ALT: 20 U/L (ref 0–53)
AST: 20 U/L (ref 0–37)
Albumin: 4.2 g/dL (ref 3.5–5.2)
Alkaline Phosphatase: 57 U/L (ref 39–117)
BILIRUBIN TOTAL: 0.5 mg/dL (ref 0.2–1.2)
BUN: 17 mg/dL (ref 6–23)
CALCIUM: 10.2 mg/dL (ref 8.4–10.5)
CHLORIDE: 103 meq/L (ref 96–112)
CO2: 30 meq/L (ref 19–32)
CREATININE: 1 mg/dL (ref 0.40–1.50)
GFR: 76.42 mL/min (ref >60.00)
Glucose, Bld: 123 mg/dL — ABNORMAL HIGH (ref 70–99)
Potassium: 4 mEq/L (ref 3.5–5.1)
SODIUM: 140 meq/L (ref 135–145)
Total Protein: 7 g/dL (ref 6.0–8.3)

## 2017-07-26 LAB — CBC
HCT: 42.9 % (ref 39.0–52.0)
Hemoglobin: 14.6 g/dL (ref 13.0–17.0)
MCHC: 34.1 g/dL (ref 30.0–36.0)
MCV: 89.7 fl (ref 78.0–100.0)
Platelets: 193 10*3/uL (ref 150.0–400.0)
RBC: 4.79 Mil/uL (ref 4.22–5.81)
RDW: 13.7 % (ref 11.5–15.5)
WBC: 7.2 10*3/uL (ref 4.0–10.5)

## 2017-07-26 LAB — LIPID PANEL
Cholesterol: 242 mg/dL — ABNORMAL HIGH (ref 0–200)
HDL: 34.8 mg/dL — ABNORMAL LOW (ref >39.00)
LDL CALC: 169 mg/dL — AB (ref 0–99)
NonHDL: 206.94
TRIGLYCERIDES: 192 mg/dL — AB (ref 0.0–149.0)
Total CHOL/HDL Ratio: 7
VLDL: 38.4 mg/dL (ref 0.0–40.0)

## 2017-07-26 LAB — HEMOGLOBIN A1C: HEMOGLOBIN A1C: 6.3 % (ref 4.6–6.5)

## 2017-07-26 LAB — TSH: TSH: 2.03 u[IU]/mL (ref 0.35–4.50)

## 2017-07-27 DIAGNOSIS — B351 Tinea unguium: Secondary | ICD-10-CM | POA: Diagnosis not present

## 2017-07-27 DIAGNOSIS — M79674 Pain in right toe(s): Secondary | ICD-10-CM | POA: Diagnosis not present

## 2017-07-27 DIAGNOSIS — M79675 Pain in left toe(s): Secondary | ICD-10-CM | POA: Diagnosis not present

## 2017-07-30 MED FILL — SHINGRIX 50 MCG SUS: 50 | 1 days supply | Qty: 1 | Fill #0

## 2017-09-16 ENCOUNTER — Other Ambulatory Visit: Payer: Self-pay | Admitting: Family Medicine

## 2017-09-16 DIAGNOSIS — I10 Essential (primary) hypertension: Secondary | ICD-10-CM

## 2017-09-20 DIAGNOSIS — H2513 Age-related nuclear cataract, bilateral: Secondary | ICD-10-CM | POA: Diagnosis not present

## 2017-09-20 DIAGNOSIS — H43813 Vitreous degeneration, bilateral: Secondary | ICD-10-CM | POA: Diagnosis not present

## 2017-09-20 DIAGNOSIS — H52223 Regular astigmatism, bilateral: Secondary | ICD-10-CM | POA: Diagnosis not present

## 2017-09-20 DIAGNOSIS — H35371 Puckering of macula, right eye: Secondary | ICD-10-CM | POA: Diagnosis not present

## 2017-09-20 DIAGNOSIS — H353131 Nonexudative age-related macular degeneration, bilateral, early dry stage: Secondary | ICD-10-CM | POA: Diagnosis not present

## 2017-09-20 DIAGNOSIS — H524 Presbyopia: Secondary | ICD-10-CM | POA: Diagnosis not present

## 2017-09-20 DIAGNOSIS — H5213 Myopia, bilateral: Secondary | ICD-10-CM | POA: Diagnosis not present

## 2017-09-28 MED FILL — SHINGRIX 50 MCG SUS: 50 | 1 days supply | Qty: 1 | Fill #1

## 2017-10-05 DIAGNOSIS — H35033 Hypertensive retinopathy, bilateral: Secondary | ICD-10-CM | POA: Diagnosis not present

## 2017-10-05 DIAGNOSIS — H43813 Vitreous degeneration, bilateral: Secondary | ICD-10-CM | POA: Diagnosis not present

## 2017-10-05 DIAGNOSIS — H353131 Nonexudative age-related macular degeneration, bilateral, early dry stage: Secondary | ICD-10-CM | POA: Diagnosis not present

## 2017-10-05 DIAGNOSIS — H2513 Age-related nuclear cataract, bilateral: Secondary | ICD-10-CM | POA: Diagnosis not present

## 2017-10-08 ENCOUNTER — Encounter: Payer: Self-pay | Admitting: Family Medicine

## 2017-10-09 ENCOUNTER — Other Ambulatory Visit: Payer: Self-pay | Admitting: Internal Medicine

## 2017-10-10 ENCOUNTER — Other Ambulatory Visit: Payer: Self-pay | Admitting: Internal Medicine
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
## 2017-10-18 ENCOUNTER — Ambulatory Visit (INDEPENDENT_AMBULATORY_CARE_PROVIDER_SITE_OTHER): Payer: Medicare Other | Admitting: *Deleted

## 2017-10-18 ENCOUNTER — Encounter: Payer: Self-pay | Admitting: Family Medicine

## 2017-10-18 ENCOUNTER — Ambulatory Visit (INDEPENDENT_AMBULATORY_CARE_PROVIDER_SITE_OTHER): Payer: Medicare Other | Admitting: Family Medicine

## 2017-10-18 ENCOUNTER — Encounter: Payer: Self-pay | Admitting: *Deleted

## 2017-10-18 VITALS — BP 132/76 | HR 60 | Ht 68.0 in | Wt 216.2 lb

## 2017-10-18 VITALS — BP 132/76 | HR 60 | Temp 98.6°F | Resp 18 | Wt 216.0 lb

## 2017-10-18 DIAGNOSIS — E785 Hyperlipidemia, unspecified: Secondary | ICD-10-CM

## 2017-10-18 DIAGNOSIS — H269 Unspecified cataract: Secondary | ICD-10-CM

## 2017-10-18 DIAGNOSIS — I10 Essential (primary) hypertension: Secondary | ICD-10-CM

## 2017-10-18 DIAGNOSIS — R739 Hyperglycemia, unspecified: Secondary | ICD-10-CM

## 2017-10-18 DIAGNOSIS — M79642 Pain in left hand: Secondary | ICD-10-CM | POA: Diagnosis not present

## 2017-10-18 DIAGNOSIS — Z0000 Encounter for general adult medical examination without abnormal findings: Secondary | ICD-10-CM | POA: Diagnosis not present

## 2017-10-18 MED ORDER — FLUTICASONE PROPIONATE 50 MCG/ACT NA SUSP: 1.0000 | Freq: Every day | NASAL | 3 refills | Status: DC

## 2017-10-18 NOTE — Assessment & Plan Note (Signed)
encouraged heart healthy diet, avoid trans fats, minimize simple carbs and saturated fats. Increase exercise as tolerated 

## 2017-10-18 NOTE — Assessment & Plan Note (Signed)
Well controlled, no changes to meds. Encouraged heart healthy diet such as the DASH diet and exercise as tolerated.  °

## 2017-10-18 NOTE — Assessment & Plan Note (Signed)
Switched to [facility] [facility] eye and has an appointment with Dr Marlou Sa for consultation on right cataract surgery soon

## 2017-10-18 NOTE — Patient Instructions (Signed)
[employer] and [employer] companies all make an over the counter lidocaine gel now that can help hand pain Hypertension Hypertension, commonly called high blood pressure, is when the force of blood pumping through the arteries is too strong. The arteries are the blood vessels that carry blood from the heart throughout the body. Hypertension forces the heart to work harder to pump blood and may cause arteries to become narrow or stiff. Having untreated or uncontrolled hypertension can cause heart attacks, strokes, kidney disease, and other problems. A blood pressure reading consists of a higher number over a lower number. Ideally, your blood pressure should be below 120/80. The first ("top") number is called the systolic pressure. It is a measure of the pressure in your arteries as your heart beats. The second ("bottom") number is called the diastolic pressure. It is a measure of the pressure in your arteries as the heart relaxes. What are the causes? The cause of this condition is not known. What increases the risk? Some risk factors for high blood pressure are under your control. Others are not. Factors you can change  Smoking.  Having type 2 diabetes mellitus, high cholesterol, or both.  Not getting enough exercise or physical activity.  Being overweight.  Having too much fat, sugar, calories, or salt (sodium) in your diet.  Drinking too much alcohol. Factors that are difficult or impossible to change  Having chronic kidney disease.  Having a family history of high blood pressure.  Age. Risk increases with age.  Race. You may be at higher risk if you are African-American.  Gender. Men are at higher risk than women before age 59. After age 67, women are at higher risk than men.  Having obstructive sleep apnea.  Stress. What are the signs or symptoms? Extremely high blood pressure (hypertensive crisis) may cause:  Headache.  Anxiety.  Shortness of  breath.  Nosebleed.  Nausea and vomiting.  Severe chest pain.  Jerky movements you cannot control (seizures).  How is this diagnosed? This condition is diagnosed by measuring your blood pressure while you are seated, with your arm resting on a surface. The cuff of the blood pressure monitor will be placed directly against the skin of your upper arm at the level of your heart. It should be measured at least twice using the same arm. Certain conditions can cause a difference in blood pressure between your right and left arms. Certain factors can cause blood pressure readings to be lower or higher than normal (elevated) for a short period of time:  When your blood pressure is higher when you are in a health care provider's office than when you are at home, this is called white coat hypertension. Most people with this condition do not need medicines.  When your blood pressure is higher at home than when you are in a health care provider's office, this is called masked hypertension. Most people with this condition may need medicines to control blood pressure.  If you have a high blood pressure reading during one visit or you have normal blood pressure with other risk factors:  You may be asked to return on a different day to have your blood pressure checked again.  You may be asked to monitor your blood pressure at home for 1 week or longer.  If you are diagnosed with hypertension, you may have other blood or imaging tests to help your health care provider understand your overall risk for other conditions. How is this treated?  This condition is treated by making healthy lifestyle changes, such as eating healthy foods, exercising more, and reducing your alcohol intake. Your health care provider may prescribe medicine if lifestyle changes are not enough to get your blood pressure under control, and if:  Your systolic blood pressure is above 130.  Your diastolic blood pressure is above  80.  Your personal target blood pressure may vary depending on your medical conditions, your age, and other factors. Follow these instructions at home: Eating and drinking  Eat a diet that is high in fiber and potassium, and low in sodium, added sugar, and fat. An example eating plan is called the DASH (Dietary Approaches to Stop Hypertension) diet. To eat this way: ? Eat plenty of fresh fruits and vegetables. Try to fill half of your plate at each meal with fruits and vegetables. ? Eat whole grains, such as whole wheat pasta, brown rice, or whole grain bread. Fill about one quarter of your plate with whole grains. ? Eat or drink low-fat dairy products, such as skim milk or low-fat yogurt. ? Avoid fatty cuts of meat, processed or cured meats, and poultry with skin. Fill about one quarter of your plate with lean proteins, such as fish, chicken without skin, beans, eggs, and tofu. ? Avoid premade and processed foods. These tend to be higher in sodium, added sugar, and fat.  Reduce your daily sodium intake. Most people with hypertension should eat less than 1,500 mg of sodium a day.  Limit alcohol intake to no more than 1 drink a day for nonpregnant women and 2 drinks a day for men. One drink equals 12 oz of beer, 5 oz of wine, or 1 oz of hard liquor. Lifestyle  Work with your health care provider to maintain a healthy body weight or to lose weight. Ask what an ideal weight is for you.  Get at least 30 minutes of exercise that causes your heart to beat faster (aerobic exercise) most days of the week. Activities may include walking, swimming, or biking.  Include exercise to strengthen your muscles (resistance exercise), such as pilates or lifting weights, as part of your weekly exercise routine. Try to do these types of exercises for 30 minutes at least 3 days a week.  Do not use any products that contain nicotine or tobacco, such as cigarettes and e-cigarettes. If you need help quitting, ask  your health care provider.  Monitor your blood pressure at home as told by your health care provider.  Keep all follow-up visits as told by your health care provider. This is important. Medicines  Take over-the-counter and prescription medicines only as told by your health care provider. Follow directions carefully. Blood pressure medicines must be taken as prescribed.  Do not skip doses of blood pressure medicine. Doing this puts you at risk for problems and can make the medicine less effective.  Ask your health care provider about side effects or reactions to medicines that you should watch for. Contact a health care provider if:  You think you are having a reaction to a medicine you are taking.  You have headaches that keep coming back (recurring).  You feel dizzy.  You have swelling in your ankles.  You have trouble with your vision. Get help right away if:  You develop a severe headache or confusion.  You have unusual weakness or numbness.  You feel faint.  You have severe pain in your chest or abdomen.  You vomit repeatedly.  You have trouble  breathing. Summary  Hypertension is when the force of blood pumping through your arteries is too strong. If this condition is not controlled, it may put you at risk for serious complications.  Your personal target blood pressure may vary depending on your medical conditions, your age, and other factors. For most people, a normal blood pressure is less than 120/80.  Hypertension is treated with lifestyle changes, medicines, or a combination of both. Lifestyle changes include weight loss, eating a healthy, low-sodium diet, exercising more, and limiting alcohol. This information is not intended to replace advice given to you by your health care provider. Make sure you discuss any questions you have with your health care provider. Document Released: 01/19/2005 Document Revised: 12/18/2015 Document Reviewed: 12/18/2015 Elsevier  Interactive Patient Education  [employer].
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
## 2017-10-18 NOTE — Assessment & Plan Note (Signed)
hgba1c acceptable, minimize simple carbs. Increase exercise as tolerated.  

## 2017-10-18 NOTE — Patient Instructions (Addendum)
Follow up with Dr.Blyth today as scheduled.  Please schedule your next medicare wellness visit with me in 1 yr.  Continue to eat heart healthy diet (full of fruits, vegetables, whole grains, lean protein, water--limit salt, fat, and sugar intake) and increase physical activity as tolerated.  Bring a copy of your living will and/or healthcare power of attorney to your next office visit.   Matthew [name] , Thank you for taking time to come for your Medicare Wellness Visit. I appreciate your ongoing commitment to your health goals. Please review the following plan we discussed and let me know if I can assist you in the future.   These are the goals we discussed: Goals    . Weight (lb) < 200 lb (90.7 kg)     By continuing to exercise and eat healthy       This is a list of the screening recommended for you and due dates:  Health Maintenance  Topic Date Due  . Tetanus Vaccine  09/26/2016  . Flu Shot  09/02/2017  . Pneumonia vaccines  Completed    Health Maintenance, Male A healthy lifestyle and preventive care is important for your health and wellness. Ask your health care provider about what schedule of regular examinations is right for you. What should I know about weight and diet? Eat a Healthy Diet  Eat plenty of vegetables, fruits, whole grains, low-fat dairy products, and lean protein.  Do not eat a lot of foods high in solid fats, added sugars, or salt.  Maintain a Healthy Weight Regular exercise can help you achieve or maintain a healthy weight. You should:  Do at least 150 minutes of exercise each week. The exercise should increase your heart rate and make you sweat (moderate-intensity exercise).  Do strength-training exercises at least twice a week.  Watch Your Levels of Cholesterol and Blood Lipids  Have your blood tested for lipids and cholesterol every 5 years starting at 80 years of age. If you are at high risk for heart disease, you should start having your blood  tested when you are 80 years old. You may need to have your cholesterol levels checked more often if: ? Your lipid or cholesterol levels are high. ? You are older than 80 years of age. ? You are at high risk for heart disease.  What should I know about cancer screening? Many types of cancers can be detected early and may often be prevented. Lung Cancer  You should be screened every year for lung cancer if: ? You are a current smoker who has smoked for at least 30 years. ? You are a former smoker who has quit within the past 15 years.  Talk to your health care provider about your screening options, when you should start screening, and how often you should be screened.  Colorectal Cancer  Routine colorectal cancer screening usually begins at 80 years of age and should be repeated every 5-10 years until you are 80 years old. You may need to be screened more often if early forms of precancerous polyps or small growths are found. Your health care provider may recommend screening at an earlier age if you have risk factors for colon cancer.  Your health care provider may recommend using home test kits to check for hidden blood in the stool.  A small camera at the end of a tube can be used to examine your colon (sigmoidoscopy or colonoscopy). This checks for the earliest forms of colorectal cancer.  Prostate  and Testicular Cancer  Depending on your age and overall health, your health care provider may do certain tests to screen for prostate and testicular cancer.  Talk to your health care provider about any symptoms or concerns you have about testicular or prostate cancer.  Skin Cancer  Check your skin from head to toe regularly.  Tell your health care provider about any new moles or changes in moles, especially if: ? There is a change in a mole's size, shape, or color. ? You have a mole that is larger than a pencil eraser.  Always use sunscreen. Apply sunscreen liberally and repeat  throughout the day.  Protect yourself by wearing long sleeves, pants, a wide-brimmed hat, and sunglasses when outside.  What should I know about heart disease, diabetes, and high blood pressure?  If you are 37-39 years of age, have your blood pressure checked every 3-5 years. If you are 47 years of age or older, have your blood pressure checked every year. You should have your blood pressure measured twice-once when you are at a hospital or clinic, and once when you are not at a hospital or clinic. Record the average of the two measurements. To check your blood pressure when you are not at a hospital or clinic, you can use: ? An automated blood pressure machine at a pharmacy. ? A home blood pressure monitor.  Talk to your health care provider about your target blood pressure.  If you are between 76-50 years old, ask your health care provider if you should take aspirin to prevent heart disease.  Have regular diabetes screenings by checking your fasting blood sugar level. ? If you are at a normal weight and have a low risk for diabetes, have this test once every three years after the age of 12. ? If you are overweight and have a high risk for diabetes, consider being tested at a younger age or more often.  A one-time screening for abdominal aortic aneurysm (AAA) by ultrasound is recommended for men aged 38-75 years who are current or former smokers. What should I know about preventing infection? Hepatitis B If you have a higher risk for hepatitis B, you should be screened for this virus. Talk with your health care provider to find out if you are at risk for hepatitis B infection. Hepatitis C Blood testing is recommended for:  Everyone born from [birth year] through 1963.  Anyone with known risk factors for hepatitis C.  Sexually Transmitted Diseases (STDs)  You should be screened each year for STDs including gonorrhea and chlamydia if: ? You are sexually active and are younger than 80 years of  age. ? You are older than 80 years of age and your health care provider tells you that you are at risk for this type of infection. ? Your sexual activity has changed since you were last screened and you are at an increased risk for chlamydia or gonorrhea. Ask your health care provider if you are at risk.  Talk with your health care provider about whether you are at high risk of being infected with HIV. Your health care provider may recommend a prescription medicine to help prevent HIV infection.  What else can I do?  Schedule regular health, dental, and eye exams.  Stay current with your vaccines (immunizations).  Do not use any tobacco products, such as cigarettes, chewing tobacco, and e-cigarettes. If you need help quitting, ask your health care provider.  Limit alcohol intake to no more than 2 drinks  per day. One drink equals 12 ounces of beer, 5 ounces of wine, or 1 ounces of hard liquor.  Do not use street drugs.  Do not share needles.  Ask your health care provider for help if you need support or information about quitting drugs.  Tell your health care provider if you often feel depressed.  Tell your health care provider if you have ever been abused or do not feel safe at home. This information is not intended to replace advice given to you by your health care provider. Make sure you discuss any questions you have with your health care provider. Document Released: 07/18/2007 Document Revised: 09/18/2015 Document Reviewed: 10/23/2014 Elsevier Interactive Patient Education  [employer].

## 2017-10-19 DIAGNOSIS — B351 Tinea unguium: Secondary | ICD-10-CM | POA: Diagnosis not present

## 2017-10-19 DIAGNOSIS — M79675 Pain in left toe(s): Secondary | ICD-10-CM | POA: Diagnosis not present

## 2017-10-19 DIAGNOSIS — M79674 Pain in right toe(s): Secondary | ICD-10-CM | POA: Diagnosis not present

## 2017-10-20 DIAGNOSIS — H35033 Hypertensive retinopathy, bilateral: Secondary | ICD-10-CM | POA: Diagnosis not present

## 2017-10-20 DIAGNOSIS — M79642 Pain in left hand: Secondary | ICD-10-CM | POA: Insufficient documentation

## 2017-10-20 DIAGNOSIS — H43813 Vitreous degeneration, bilateral: Secondary | ICD-10-CM | POA: Diagnosis not present

## 2017-10-20 DIAGNOSIS — H353131 Nonexudative age-related macular degeneration, bilateral, early dry stage: Secondary | ICD-10-CM | POA: Diagnosis not present

## 2017-10-20 DIAGNOSIS — H2513 Age-related nuclear cataract, bilateral: Secondary | ICD-10-CM | POA: Diagnosis not present

## 2017-10-20 NOTE — Assessment & Plan Note (Signed)
Has a history of left  hand pain which required surgery in the past. He has now developed a new nodule on his finge which is tender and needs a referral to surgeon to discuss his options.

## 2017-10-28 ENCOUNTER — Other Ambulatory Visit (INDEPENDENT_AMBULATORY_CARE_PROVIDER_SITE_OTHER): Payer: Medicare Other

## 2017-10-28 DIAGNOSIS — R739 Hyperglycemia, unspecified: Secondary | ICD-10-CM | POA: Diagnosis not present

## 2017-10-28 DIAGNOSIS — I10 Essential (primary) hypertension: Secondary | ICD-10-CM | POA: Diagnosis not present

## 2017-10-28 DIAGNOSIS — E785 Hyperlipidemia, unspecified: Secondary | ICD-10-CM | POA: Diagnosis not present

## 2017-10-28 LAB — COMPREHENSIVE METABOLIC PANEL
ALT: 19 U/L (ref 0–53)
AST: 18 U/L (ref 0–37)
Albumin: 4.1 g/dL (ref 3.5–5.2)
Alkaline Phosphatase: 53 U/L (ref 39–117)
BUN: 17 mg/dL (ref 6–23)
CO2: 28 mEq/L (ref 19–32)
CREATININE: 1.04 mg/dL (ref 0.40–1.50)
Calcium: 10.1 mg/dL (ref 8.4–10.5)
Chloride: 103 mEq/L (ref 96–112)
GFR: 72.99 mL/min (ref >60.00)
GLUCOSE: 102 mg/dL — AB (ref 70–99)
Potassium: 3.7 mEq/L (ref 3.5–5.1)
Sodium: 139 mEq/L (ref 135–145)
Total Bilirubin: 0.6 mg/dL (ref 0.2–1.2)
Total Protein: 6.9 g/dL (ref 6.0–8.3)

## 2017-10-28 LAB — LIPID PANEL
CHOL/HDL RATIO: 6
Cholesterol: 233 mg/dL — ABNORMAL HIGH (ref 0–200)
HDL: 37.9 mg/dL — ABNORMAL LOW (ref >39.00)
NONHDL: 195.49
Triglycerides: 209 mg/dL — ABNORMAL HIGH (ref 0.0–149.0)
VLDL: 41.8 mg/dL — ABNORMAL HIGH (ref 0.0–40.0)

## 2017-10-28 LAB — CBC
HEMATOCRIT: 42.2 % (ref 39.0–52.0)
Hemoglobin: 14.5 g/dL (ref 13.0–17.0)
MCHC: 34.4 g/dL (ref 30.0–36.0)
MCV: 88.5 fl (ref 78.0–100.0)
PLATELETS: 220 10*3/uL (ref 150.0–400.0)
RBC: 4.77 Mil/uL (ref 4.22–5.81)
RDW: 13.7 % (ref 11.5–15.5)
WBC: 8.5 10*3/uL (ref 4.0–10.5)

## 2017-10-28 LAB — TSH: TSH: 1.72 u[IU]/mL (ref 0.35–4.50)

## 2017-10-28 LAB — HEMOGLOBIN A1C: HEMOGLOBIN A1C: 6.4 % (ref 4.6–6.5)

## 2017-10-28 LAB — LDL CHOLESTEROL, DIRECT: Direct LDL: 175 mg/dL

## 2017-10-29 DIAGNOSIS — Z23 Encounter for immunization: Secondary | ICD-10-CM | POA: Diagnosis not present

## 2017-11-05 ENCOUNTER — Telehealth: Payer: Self-pay | Admitting: Family Medicine

## 2017-11-05 MED ORDER — OMEPRAZOLE 20 MG PO CPDR: DELAYED_RELEASE_CAPSULE | ORAL | 2 refills | Status: DC

## 2017-11-05 NOTE — Telephone Encounter (Signed)
Sent medication to pharmacy patient made aware

## 2017-11-05 NOTE — Telephone Encounter (Signed)
Copied from [place] [id number]. Topic: Quick Communication - See Telephone Encounter >> Nov 05, 2017  9:26 AM Rosalin Hawking wrote: CRM for notification. See Telephone encounter for: 11/05/17.   Pt came in office stating that his medication for omeprazole (PRILOSEC) 20 MG capsule was sent to his mailing Pharm[facility], but pt states that rx was mailed out but never received it at home at all, pt is completely out of meds since Sept 16 and is having stomach issues, pt informed has Post office investigating where the rx was sent out. Pt would like to know if he can get a few rx sent to [facility] at [address] during the mean time, until he finds out where did his rx was sent out too.  Please advise.

## 2017-11-05 NOTE — Telephone Encounter (Signed)
Copied from [place] [id number]. Topic: Quick Communication - See Telephone Encounter >> Nov 05, 2017 12:24 PM Rosalin Hawking wrote: CRM for notification. See Telephone encounter for: 11/05/17.   Pt dropped off Advanced Directives for provider to have on pt's chart (document in large yellow envelope) Document put at front office tray under providers name.

## 2017-11-08 DIAGNOSIS — L570 Actinic keratosis: Secondary | ICD-10-CM | POA: Diagnosis not present

## 2017-11-08 DIAGNOSIS — L821 Other seborrheic keratosis: Secondary | ICD-10-CM | POA: Diagnosis not present

## 2017-11-08 DIAGNOSIS — Z85828 Personal history of other malignant neoplasm of skin: Secondary | ICD-10-CM | POA: Diagnosis not present

## 2017-11-08 DIAGNOSIS — D045 Carcinoma in situ of skin of trunk: Secondary | ICD-10-CM | POA: Diagnosis not present

## 2017-11-08 DIAGNOSIS — D485 Neoplasm of uncertain behavior of skin: Secondary | ICD-10-CM | POA: Diagnosis not present

## 2017-11-09 NOTE — Telephone Encounter (Signed)
Paperwork labeled and forwarded to provider for Initialing; then will have scanned in to chart/SLS 10/08

## 2017-11-12 DIAGNOSIS — C61 Malignant neoplasm of prostate: Secondary | ICD-10-CM | POA: Diagnosis not present

## 2017-11-12 DIAGNOSIS — N393 Stress incontinence (female) (male): Secondary | ICD-10-CM | POA: Diagnosis not present

## 2017-11-12 DIAGNOSIS — Z8546 Personal history of malignant neoplasm of prostate: Secondary | ICD-10-CM | POA: Diagnosis not present

## 2017-11-12 DIAGNOSIS — N528 Other male erectile dysfunction: Secondary | ICD-10-CM | POA: Diagnosis not present

## 2017-11-18 DIAGNOSIS — H2513 Age-related nuclear cataract, bilateral: Secondary | ICD-10-CM | POA: Diagnosis not present

## 2017-11-18 DIAGNOSIS — H2511 Age-related nuclear cataract, right eye: Secondary | ICD-10-CM | POA: Diagnosis not present

## 2017-11-30 DIAGNOSIS — H2511 Age-related nuclear cataract, right eye: Secondary | ICD-10-CM | POA: Diagnosis not present

## 2017-11-30 DIAGNOSIS — H25811 Combined forms of age-related cataract, right eye: Secondary | ICD-10-CM | POA: Diagnosis not present

## 2017-11-30 DIAGNOSIS — H2181 Floppy iris syndrome: Secondary | ICD-10-CM | POA: Diagnosis not present

## 2017-12-08 DIAGNOSIS — H2512 Age-related nuclear cataract, left eye: Secondary | ICD-10-CM | POA: Diagnosis not present

## 2017-12-14 DIAGNOSIS — H2512 Age-related nuclear cataract, left eye: Secondary | ICD-10-CM | POA: Diagnosis not present

## 2017-12-14 DIAGNOSIS — H5703 Miosis: Secondary | ICD-10-CM | POA: Diagnosis not present

## 2017-12-14 DIAGNOSIS — H25812 Combined forms of age-related cataract, left eye: Secondary | ICD-10-CM | POA: Diagnosis not present

## 2017-12-14 DIAGNOSIS — H2181 Floppy iris syndrome: Secondary | ICD-10-CM | POA: Diagnosis not present

## 2018-01-10 ENCOUNTER — Emergency Department (HOSPITAL_BASED_OUTPATIENT_CLINIC_OR_DEPARTMENT_OTHER)
Admission: EM | Admit: 2018-01-10 | Discharge: 2018-01-10 | Discharge status: Against Medical Advice | Payer: Medicare Other

## 2018-01-10 NOTE — ED Triage Notes (Signed)
Pt to triage -states he cut left index finger on knife ~15 min PTA-pt removed paper towel from finger-no bleeding-?slight lac to lateral side-pt states he does not want to wait to be seen-requested bandaid-left NAD-pleasant

## 2018-02-11 DIAGNOSIS — L600 Ingrowing nail: Secondary | ICD-10-CM | POA: Diagnosis not present

## 2018-02-11 DIAGNOSIS — M79675 Pain in left toe(s): Secondary | ICD-10-CM | POA: Diagnosis not present

## 2018-02-11 DIAGNOSIS — B351 Tinea unguium: Secondary | ICD-10-CM | POA: Diagnosis not present

## 2018-02-11 DIAGNOSIS — M79674 Pain in right toe(s): Secondary | ICD-10-CM | POA: Diagnosis not present

## 2018-02-25 ENCOUNTER — Telehealth: Payer: Self-pay

## 2018-02-25 DIAGNOSIS — R739 Hyperglycemia, unspecified: Secondary | ICD-10-CM

## 2018-02-25 DIAGNOSIS — I10 Essential (primary) hypertension: Secondary | ICD-10-CM

## 2018-02-25 DIAGNOSIS — E785 Hyperlipidemia, unspecified: Secondary | ICD-10-CM

## 2018-02-25 NOTE — Telephone Encounter (Signed)
Patient came in with his spouse to have labs ordered for 04/24/18

## 2018-03-05 ENCOUNTER — Other Ambulatory Visit: Payer: Self-pay | Admitting: Family Medicine

## 2018-03-05 DIAGNOSIS — I10 Essential (primary) hypertension: Secondary | ICD-10-CM

## 2018-03-15 DIAGNOSIS — M7989 Other specified soft tissue disorders: Secondary | ICD-10-CM | POA: Diagnosis not present

## 2018-03-15 DIAGNOSIS — D489 Neoplasm of uncertain behavior, unspecified: Secondary | ICD-10-CM | POA: Insufficient documentation

## 2018-03-24 DIAGNOSIS — Z961 Presence of intraocular lens: Secondary | ICD-10-CM | POA: Diagnosis not present

## 2018-03-24 DIAGNOSIS — H35371 Puckering of macula, right eye: Secondary | ICD-10-CM | POA: Diagnosis not present

## 2018-03-24 DIAGNOSIS — H59031 Cystoid macular edema following cataract surgery, right eye: Secondary | ICD-10-CM | POA: Diagnosis not present

## 2018-03-24 DIAGNOSIS — H353131 Nonexudative age-related macular degeneration, bilateral, early dry stage: Secondary | ICD-10-CM | POA: Diagnosis not present

## 2018-04-11 ENCOUNTER — Encounter: Payer: Self-pay | Admitting: Medical

## 2018-04-11 ENCOUNTER — Ambulatory Visit (INDEPENDENT_AMBULATORY_CARE_PROVIDER_SITE_OTHER): Payer: Medicare Other | Admitting: Medical

## 2018-04-11 ENCOUNTER — Other Ambulatory Visit: Payer: Self-pay | Admitting: Medical

## 2018-04-11 VITALS — BP 132/78 | HR 74 | Temp 98.0°F | Resp 18 | Ht 68.0 in | Wt 215.0 lb

## 2018-04-11 DIAGNOSIS — J0100 Acute maxillary sinusitis, unspecified: Secondary | ICD-10-CM

## 2018-04-11 DIAGNOSIS — R0981 Nasal congestion: Secondary | ICD-10-CM

## 2018-04-11 DIAGNOSIS — R0982 Postnasal drip: Secondary | ICD-10-CM | POA: Diagnosis not present

## 2018-04-11 MED ORDER — CEFDINIR 300 MG PO CAPS: 300.0000 mg | ORAL_CAPSULE | Freq: Two times a day (BID) | ORAL | 0 refills | Status: DC

## 2018-04-11 MED ORDER — BENZONATATE 100 MG PO CAPS: 100.0000 mg | ORAL_CAPSULE | Freq: Three times a day (TID) | ORAL | 0 refills | Status: DC | PRN

## 2018-04-11 MED ORDER — AZELASTINE HCL 0.1 % NA SOLN: 2.0000 | Freq: Two times a day (BID) | NASAL | 2 refills | Status: DC

## 2018-04-11 MED ORDER — FLUTICASONE PROPIONATE 50 MCG/ACT NA SUSP: 2.0000 | Freq: Every day | NASAL | 1 refills | Status: DC
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
## 2018-04-11 NOTE — Patient Instructions (Addendum)
You do appear to have probable early sinus infection with possible bronchitis.  For cough, I prescribed benzonatate and for probable infections I prescribed cefdinir antibiotic.  Over the past 2 months you report chronic postnasal drainage.  This could be related to allergic rhinitis or vasomotor rhinitis.  In addition we have upcoming spring just around the corner.  Would recommend that you restart Flonase and see if your symptoms improve over the next 4 to 5 days.  If not improving adequately then add Astelin nasal spray.  You could also restart your Claritin over-the-counter.  If signs symptoms worsen or change please let us know.  Follow-up in 7 to 10 days for any lingering symptoms or sooner for worsening or changing sym

## 2018-04-15 ENCOUNTER — Other Ambulatory Visit: Payer: Self-pay | Admitting: Family Medicine

## 2018-04-29 ENCOUNTER — Other Ambulatory Visit: Payer: Medicare Other

## 2018-05-02 ENCOUNTER — Ambulatory Visit (INDEPENDENT_AMBULATORY_CARE_PROVIDER_SITE_OTHER): Payer: Medicare Other | Admitting: Family Medicine

## 2018-05-02 ENCOUNTER — Other Ambulatory Visit: Payer: Self-pay

## 2018-05-02 DIAGNOSIS — E785 Hyperlipidemia, unspecified: Secondary | ICD-10-CM

## 2018-05-02 DIAGNOSIS — I10 Essential (primary) hypertension: Secondary | ICD-10-CM

## 2018-05-02 DIAGNOSIS — T7840XD Allergy, unspecified, subsequent encounter: Secondary | ICD-10-CM | POA: Diagnosis not present

## 2018-05-02 DIAGNOSIS — R739 Hyperglycemia, unspecified: Secondary | ICD-10-CM | POA: Diagnosis not present

## 2018-05-02 MED ORDER — AZELASTINE HCL 0.1 % NA SOLN: 2.0000 | Freq: Two times a day (BID) | NASAL | 2 refills | Status: DC

## 2018-05-02 MED ORDER — OMEPRAZOLE 20 MG PO CPDR: DELAYED_RELEASE_CAPSULE | ORAL | 2 refills | Status: DC

## 2018-05-02 NOTE — Assessment & Plan Note (Signed)
Is doing both Astelin and Flonase and that has helped. Still feels congestion in left nostril is clogged overnight still. Add Cetirizine 10 mg daily

## 2018-05-02 NOTE — Assessment & Plan Note (Signed)
Encouraged to monitor and notify if worsening. Minimize sodium

## 2018-05-03 NOTE — Assessment & Plan Note (Signed)
hgba1c acceptable, minimize simple carbs. Increase exercise as tolerated. Labs ordered for 2 months to be completed at [facility] prior to their video visit

## 2018-05-03 NOTE — Progress Notes (Signed)
Virtual Visit via Telephone Note  I connected with Matthew [name] on 05/03/18 at  2:30 PM EDT by telephone and verified that I am speaking with the correct person using two identifiers.   I discussed the limitations, risks, security and privacy concerns of performing an evaluation and management service by telephone and the availability of in person appointments. I also discussed with the patient that there may be a patient responsible charge related to this service. The patient expressed understanding and agreed to proceed. [name], CMA was able to get the patient on the phone.   History of Present Illness: Patient is at his home at [facility] and accompanied by his wife. They have been asked not to leave their campus. He is doing well. No recent febrile illness or hospitalizations. No polyuria or polydipsia. No acute concerns.     Observations/Objective: Patient visit completed by telephone   Assessment and Plan:  HTN (hypertension) Encouraged to monitor and notify if worsening. Minimize sodium  Allergies Is doing both Astelin and Flonase and that has helped. Still feels congestion in left nostril is clogged overnight still. Add Cetirizine 10 mg daily  Hyperglycemia hgba1c acceptable, minimize simple carbs. Increase exercise as tolerated. Labs ordered for 2 months to be completed at [facility] [place] prior to their video visit    Follow Up Instructions: 2 months via Webex, labs to be performed at [facility] prior to visit.     I discussed the assessment and treatment plan with the patient. The patient was provided an opportunity to ask questions and all were answered. The patient agreed with the plan and demonstrated an understanding of the instructions.   The patient was advised to call back or seek an in-person evaluation if the symptoms worsen or if the condition fails to improve as anticipated.  I provided 15 minutes of non-face-to-face time during this  encounter.   Penni Homans, MD

## 2018-05-04 ENCOUNTER — Telehealth: Payer: Self-pay

## 2018-05-04 ENCOUNTER — Telehealth: Payer: Self-pay | Admitting: Family Medicine

## 2018-05-04 MED ORDER — OMEPRAZOLE 20 MG PO CPDR: DELAYED_RELEASE_CAPSULE | ORAL | 2 refills | Status: DC

## 2018-05-04 NOTE — Telephone Encounter (Signed)
Spoke w/ Pt- he has already spoke w/ someone.

## 2018-05-04 NOTE — Telephone Encounter (Signed)
Copied from [place] [phone]. Topic: Quick Communication - See Telephone Encounter >> May 04, 2018 12:40 PM Berneta Levins wrote: CRM for notification. See Telephone encounter for: 05/04/18.  Pt called and left message on [facility] mailbox 05/03/2018.  Pt states that he needs to speak with someone immediately about a mixup at visit yesterday. Pt can be reached at [phone]

## 2018-05-04 NOTE — Telephone Encounter (Signed)
Copied from [place] [id number]. Topic: General - Other >> May 04, 2018 12:36 PM Percell Belt A wrote: Reason for CRM: Pt called in and stated that the omeprazole (PRILOSEC) 20 MG capsule [[id number]] should have been sent to [place], [place]

## 2018-05-04 NOTE — Telephone Encounter (Signed)
Rx resent to [facility]

## 2018-05-23 ENCOUNTER — Other Ambulatory Visit: Payer: Self-pay | Admitting: Family Medicine

## 2018-05-23 DIAGNOSIS — I10 Essential (primary) hypertension: Secondary | ICD-10-CM

## 2018-08-09 ENCOUNTER — Other Ambulatory Visit: Payer: Self-pay | Admitting: Family Medicine

## 2018-08-09 DIAGNOSIS — I10 Essential (primary) hypertension: Secondary | ICD-10-CM

## 2018-08-10 DIAGNOSIS — Y92481 Parking lot as the place of occurrence of the external cause: Secondary | ICD-10-CM | POA: Diagnosis not present

## 2018-08-10 DIAGNOSIS — S80212A Abrasion, left knee, initial encounter: Secondary | ICD-10-CM | POA: Diagnosis not present

## 2018-08-10 DIAGNOSIS — W1839XA Other fall on same level, initial encounter: Secondary | ICD-10-CM | POA: Diagnosis not present

## 2018-08-19 DIAGNOSIS — S80212D Abrasion, left knee, subsequent encounter: Secondary | ICD-10-CM | POA: Diagnosis not present

## 2018-09-12 DIAGNOSIS — S80212D Abrasion, left knee, subsequent encounter: Secondary | ICD-10-CM | POA: Diagnosis not present

## 2018-09-22 DIAGNOSIS — H43813 Vitreous degeneration, bilateral: Secondary | ICD-10-CM | POA: Diagnosis not present

## 2018-09-22 DIAGNOSIS — H35373 Puckering of macula, bilateral: Secondary | ICD-10-CM | POA: Diagnosis not present

## 2018-09-22 DIAGNOSIS — Z961 Presence of intraocular lens: Secondary | ICD-10-CM | POA: Diagnosis not present

## 2018-09-22 DIAGNOSIS — H353131 Nonexudative age-related macular degeneration, bilateral, early dry stage: Secondary | ICD-10-CM | POA: Diagnosis not present

## 2018-09-22 DIAGNOSIS — H35033 Hypertensive retinopathy, bilateral: Secondary | ICD-10-CM | POA: Diagnosis not present

## 2018-09-26 DIAGNOSIS — C44529 Squamous cell carcinoma of skin of other part of trunk: Secondary | ICD-10-CM | POA: Diagnosis not present

## 2018-09-26 DIAGNOSIS — L821 Other seborrheic keratosis: Secondary | ICD-10-CM | POA: Diagnosis not present

## 2018-09-26 DIAGNOSIS — D224 Melanocytic nevi of scalp and neck: Secondary | ICD-10-CM | POA: Diagnosis not present

## 2018-09-26 DIAGNOSIS — Z85828 Personal history of other malignant neoplasm of skin: Secondary | ICD-10-CM | POA: Diagnosis not present

## 2018-09-26 DIAGNOSIS — D485 Neoplasm of uncertain behavior of skin: Secondary | ICD-10-CM | POA: Diagnosis not present

## 2018-09-26 DIAGNOSIS — D1801 Hemangioma of skin and subcutaneous tissue: Secondary | ICD-10-CM | POA: Diagnosis not present

## 2018-09-26 DIAGNOSIS — L570 Actinic keratosis: Secondary | ICD-10-CM | POA: Diagnosis not present

## 2018-10-06 ENCOUNTER — Telehealth: Payer: Self-pay | Admitting: Family Medicine

## 2018-10-06 NOTE — Telephone Encounter (Signed)
Patient declined at this time. SF

## 2018-11-07 ENCOUNTER — Other Ambulatory Visit: Payer: Self-pay | Admitting: Family Medicine

## 2018-11-22 ENCOUNTER — Ambulatory Visit (INDEPENDENT_AMBULATORY_CARE_PROVIDER_SITE_OTHER): Payer: Medicare Other

## 2018-11-22 ENCOUNTER — Other Ambulatory Visit: Payer: Self-pay

## 2018-11-22 DIAGNOSIS — Z23 Encounter for immunization: Secondary | ICD-10-CM

## 2018-12-05 ENCOUNTER — Telehealth: Payer: Self-pay

## 2018-12-05 DIAGNOSIS — B351 Tinea unguium: Secondary | ICD-10-CM

## 2018-12-05 NOTE — Telephone Encounter (Signed)
Copied from [place] [phone]. Topic: General - Other >> Dec 05, 2018  8:47 AM Celene Kras A wrote: Reason for CRM: Pt called and is requesting to have a referral put in for him and his wife to see a foot doctor regarding their toenails. Please advise. >> Dec 05, 2018  8:49 AM Para Skeans A wrote: Wrong office.

## 2018-12-23 ENCOUNTER — Other Ambulatory Visit: Payer: Self-pay

## 2018-12-23 ENCOUNTER — Ambulatory Visit (INDEPENDENT_AMBULATORY_CARE_PROVIDER_SITE_OTHER): Payer: Medicare Other | Admitting: Podiatry

## 2018-12-23 DIAGNOSIS — M79674 Pain in right toe(s): Secondary | ICD-10-CM

## 2018-12-23 DIAGNOSIS — L603 Nail dystrophy: Secondary | ICD-10-CM

## 2018-12-23 DIAGNOSIS — M79675 Pain in left toe(s): Secondary | ICD-10-CM | POA: Diagnosis not present

## 2018-12-23 DIAGNOSIS — B351 Tinea unguium: Secondary | ICD-10-CM

## 2018-12-26 ENCOUNTER — Encounter: Payer: Self-pay | Admitting: Podiatry

## 2018-12-26 ENCOUNTER — Other Ambulatory Visit: Payer: Self-pay

## 2018-12-26 NOTE — Progress Notes (Signed)
  Subjective:  Patient ID: Matthew [name], male    DOB: December 12, 1935,  MRN: [id number]  Chief Complaint  Patient presents with  . Nail Problem    pt is here for bil toenail fungus, pt states that both of the left ingrown has been going on for awhile now   83 y.o. male returns for the above complaint.  Patient is here for painful thickened elongated mycotic toenails x10.  Patient states that he has not been able to cut his own toenails because they are gone so dystrophic.  Patient states that he is not able to cut it them. He denies any other acute complaints.   Objective:  There were no vitals filed for this visit. Podiatric Exam: Vascular: dorsalis pedis and posterior tibial pulses are palpable bilateral. Capillary return is immediate. Temperature gradient is WNL. Skin turgor WNL  Sensorium: Normal Semmes Weinstein monofilament test. Normal tactile sensation bilaterally. Nail Exam: Pt has thick disfigured discolored nails with subungual debris noted bilateral entire nail hallux through fifth toenails Ulcer Exam: There is no evidence of ulcer or pre-ulcerative changes or infection. Orthopedic Exam: Muscle tone and strength are WNL. No limitations in general ROM. No crepitus or effusions noted. HAV  B/L.  Hammer toes 2-5  B/L. Skin: No Porokeratosis. No infection or ulcers  Assessment & Plan:  Patient was evaluated and treated and all questions answered.  Onychomycosis with pain  -Nails palliatively debrided as below. -Educated on self-care  Procedure: Nail Debridement Rationale: pain  Type of Debridement: manual, sharp debridement. Instrumentation: Nail nipper, rotary burr. Number of Nails: 10  Procedures and Treatment: Consent by patient was obtained for treatment procedures. The patient understood the discussion of treatment and procedures well. All questions were answered thoroughly reviewed. Debridement of mycotic and hypertrophic toenails, 1 through 5 bilateral and clearing of  subungual debris. No ulceration, no infection noted.  Return Visit-Office Procedure: Patient instructed to return to the office for a follow up visit 3 months for continued evaluation and treatment.  Boneta Lucks, DPM    Return in about 3 months (around 03/25/2019).

## 2019-01-09 ENCOUNTER — Ambulatory Visit (INDEPENDENT_AMBULATORY_CARE_PROVIDER_SITE_OTHER): Payer: Medicare Other | Admitting: Family Medicine

## 2019-01-09 ENCOUNTER — Other Ambulatory Visit: Payer: Self-pay

## 2019-01-09 ENCOUNTER — Telehealth: Payer: Self-pay | Admitting: *Deleted

## 2019-01-09 DIAGNOSIS — R0600 Dyspnea, unspecified: Secondary | ICD-10-CM | POA: Diagnosis not present

## 2019-01-09 DIAGNOSIS — E785 Hyperlipidemia, unspecified: Secondary | ICD-10-CM

## 2019-01-09 DIAGNOSIS — I10 Essential (primary) hypertension: Secondary | ICD-10-CM | POA: Diagnosis not present

## 2019-01-09 DIAGNOSIS — G4733 Obstructive sleep apnea (adult) (pediatric): Secondary | ICD-10-CM

## 2019-01-09 DIAGNOSIS — T7840XD Allergy, unspecified, subsequent encounter: Secondary | ICD-10-CM

## 2019-01-09 DIAGNOSIS — R0609 Other forms of dyspnea: Secondary | ICD-10-CM

## 2019-01-09 DIAGNOSIS — R739 Hyperglycemia, unspecified: Secondary | ICD-10-CM

## 2019-01-09 MED ORDER — CEFDINIR 300 MG PO CAPS: 300.0000 mg | ORAL_CAPSULE | Freq: Two times a day (BID) | ORAL | 0 refills | Status: DC

## 2019-01-09 NOTE — Telephone Encounter (Signed)
Left message on machine for patient to call and set up lab only appointment once he gets the date for his echo.  Labs have been ordered.

## 2019-01-11 ENCOUNTER — Ambulatory Visit (HOSPITAL_BASED_OUTPATIENT_CLINIC_OR_DEPARTMENT_OTHER)
Admission: RE | Admit: 2019-01-11 | Discharge: 2019-01-11 | Disposition: A | Discharge status: Home / Self Care | Payer: Medicare Other | Source: Ambulatory Visit | Attending: Family Medicine | Admitting: Family Medicine

## 2019-01-11 ENCOUNTER — Other Ambulatory Visit: Payer: Self-pay

## 2019-01-11 ENCOUNTER — Other Ambulatory Visit (INDEPENDENT_AMBULATORY_CARE_PROVIDER_SITE_OTHER): Payer: Medicare Other

## 2019-01-11 DIAGNOSIS — I10 Essential (primary) hypertension: Secondary | ICD-10-CM | POA: Diagnosis not present

## 2019-01-11 DIAGNOSIS — R0609 Other forms of dyspnea: Secondary | ICD-10-CM

## 2019-01-11 DIAGNOSIS — R739 Hyperglycemia, unspecified: Secondary | ICD-10-CM | POA: Diagnosis not present

## 2019-01-11 DIAGNOSIS — R0600 Dyspnea, unspecified: Secondary | ICD-10-CM | POA: Diagnosis not present

## 2019-01-11 DIAGNOSIS — E785 Hyperlipidemia, unspecified: Secondary | ICD-10-CM

## 2019-01-11 LAB — LIPID PANEL
Cholesterol: 253 mg/dL — ABNORMAL HIGH (ref 0–200)
HDL: 39.5 mg/dL (ref >39.00)
LDL Cholesterol: 175 mg/dL — ABNORMAL HIGH (ref 0–99)
NonHDL: 213.1
Total CHOL/HDL Ratio: 6
Triglycerides: 191 mg/dL — ABNORMAL HIGH (ref 0.0–149.0)
VLDL: 38.2 mg/dL (ref 0.0–40.0)

## 2019-01-11 LAB — COMPREHENSIVE METABOLIC PANEL
ALT: 20 U/L (ref 0–53)
AST: 19 U/L (ref 0–37)
Albumin: 4.1 g/dL (ref 3.5–5.2)
Alkaline Phosphatase: 57 U/L (ref 39–117)
BUN: 17 mg/dL (ref 6–23)
CO2: 29 mEq/L (ref 19–32)
Calcium: 9.6 mg/dL (ref 8.4–10.5)
Chloride: 104 mEq/L (ref 96–112)
Creatinine, Ser: 1.04 mg/dL (ref 0.40–1.50)
GFR: 68.47 mL/min (ref >60.00)
Glucose, Bld: 97 mg/dL (ref 70–99)
Potassium: 4.2 mEq/L (ref 3.5–5.1)
Sodium: 140 mEq/L (ref 135–145)
Total Bilirubin: 0.5 mg/dL (ref 0.2–1.2)
Total Protein: 6.8 g/dL (ref 6.0–8.3)

## 2019-01-11 LAB — TSH: TSH: 1.79 u[IU]/mL (ref 0.35–4.50)

## 2019-01-11 LAB — CBC
HCT: 41.4 % (ref 39.0–52.0)
Hemoglobin: 13.7 g/dL (ref 13.0–17.0)
MCHC: 33.2 g/dL (ref 30.0–36.0)
MCV: 88.6 fl (ref 78.0–100.0)
Platelets: 217 10*3/uL (ref 150.0–400.0)
RBC: 4.68 Mil/uL (ref 4.22–5.81)
RDW: 14.5 % (ref 11.5–15.5)
WBC: 9.3 10*3/uL (ref 4.0–10.5)

## 2019-01-11 LAB — HEMOGLOBIN A1C: Hgb A1c MFr Bld: 6.1 % (ref 4.6–6.5)

## 2019-01-11 NOTE — Progress Notes (Signed)
  Echocardiogram 2D Echocardiogram has been performed.  Matthew [name] 01/11/2019, 3:43 PM

## 2019-01-13 ENCOUNTER — Other Ambulatory Visit: Payer: Self-pay | Admitting: Family Medicine

## 2019-01-13 MED ORDER — FLUTICASONE PROPIONATE 50 MCG/ACT NA SUSP: NASAL | 1 refills | Status: DC

## 2019-01-13 MED ORDER — COLESEVELAM HCL 625 MG PO TABS: 1875.0000 mg | ORAL_TABLET | Freq: Two times a day (BID) | ORAL | 1 refills | Status: DC

## 2019-01-13 MED ORDER — OMEPRAZOLE 20 MG PO CPDR: DELAYED_RELEASE_CAPSULE | ORAL | 2 refills | Status: DC

## 2019-01-13 MED ORDER — LORATADINE 10 MG PO TABS: 10.0000 mg | ORAL_TABLET | Freq: Every day | ORAL | 1 refills | Status: DC | PRN

## 2019-01-13 MED ORDER — FENOFIBRATE 134 MG PO CAPS: ORAL_CAPSULE | ORAL | 2 refills | Status: DC

## 2019-01-13 MED ORDER — LOSARTAN POTASSIUM 25 MG PO TABS: 25.0000 mg | ORAL_TABLET | Freq: Every day | ORAL | 1 refills | Status: DC

## 2019-01-13 MED ORDER — AMLODIPINE BESYLATE 5 MG PO TABS: 5.0000 mg | ORAL_TABLET | Freq: Every day | ORAL | 3 refills | Status: DC

## 2019-01-13 NOTE — Addendum Note (Signed)
Addended by: [name] A on: 01/13/2019 10:44 AM   Modules accepted: Orders

## 2019-01-16 DIAGNOSIS — R0609 Other forms of dyspnea: Secondary | ICD-10-CM | POA: Insufficient documentation
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
## 2019-01-16 NOTE — Assessment & Plan Note (Signed)
Proceed with echo and cardiac concsultation, report worsening symptoms
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
## 2019-01-16 NOTE — Assessment & Plan Note (Signed)
Monitor and report any concerns, no changes to meds. Encouraged heart healthy diet such as the DASH diet and exercise as tolerated.  ?

## 2019-01-16 NOTE — Assessment & Plan Note (Addendum)
encouraged heart healthy diet, avoid trans fats, minimize simple carbs and saturated fats. Increase exercise as tolerated. Does tolerate statins. Will add Welchol

## 2019-01-16 NOTE — Assessment & Plan Note (Signed)
hgba1c acceptable, minimize simple carbs. Increase exercise as tolerated.  

## 2019-01-24 ENCOUNTER — Telehealth: Payer: Self-pay

## 2019-01-24 DIAGNOSIS — J343 Hypertrophy of nasal turbinates: Secondary | ICD-10-CM | POA: Insufficient documentation

## 2019-01-24 NOTE — Telephone Encounter (Signed)
Copied from [place] [phone]. Topic: General - Inquiry >> Jan 24, 2019  3:26 PM Reyne Dumas L wrote: Reason for CRM:   Pt wants to know if it is okay for him to have Moderna vaccine at [facility].  Pt states that he wants OK from PCP before getting vaccinated.

## 2019-01-25 NOTE — Telephone Encounter (Signed)
Yes he should take the Moderna vaccine. That is great

## 2019-01-25 NOTE — Telephone Encounter (Signed)
Patient notified

## 2019-01-25 NOTE — Telephone Encounter (Signed)
Please advise 

## 2019-02-01 ENCOUNTER — Other Ambulatory Visit: Payer: Self-pay | Admitting: Family Medicine

## 2019-02-06 ENCOUNTER — Ambulatory Visit (INDEPENDENT_AMBULATORY_CARE_PROVIDER_SITE_OTHER): Payer: Medicare HMO | Admitting: Cardiology

## 2019-02-06 ENCOUNTER — Encounter: Payer: Self-pay | Admitting: Cardiology

## 2019-02-06 ENCOUNTER — Other Ambulatory Visit: Payer: Self-pay

## 2019-02-06 ENCOUNTER — Encounter: Payer: Self-pay | Admitting: *Deleted

## 2019-02-06 ENCOUNTER — Telehealth (HOSPITAL_COMMUNITY): Payer: Self-pay

## 2019-02-06 VITALS — BP 138/82 | HR 70 | Ht 68.0 in | Wt 216.0 lb

## 2019-02-06 DIAGNOSIS — R0600 Dyspnea, unspecified: Secondary | ICD-10-CM

## 2019-02-06 DIAGNOSIS — R7303 Prediabetes: Secondary | ICD-10-CM | POA: Diagnosis not present

## 2019-02-06 DIAGNOSIS — E782 Mixed hyperlipidemia: Secondary | ICD-10-CM

## 2019-02-06 DIAGNOSIS — E7800 Pure hypercholesterolemia, unspecified: Secondary | ICD-10-CM

## 2019-02-06 DIAGNOSIS — R079 Chest pain, unspecified: Secondary | ICD-10-CM

## 2019-02-06 DIAGNOSIS — I10 Essential (primary) hypertension: Secondary | ICD-10-CM

## 2019-02-06 DIAGNOSIS — R0609 Other forms of dyspnea: Secondary | ICD-10-CM

## 2019-02-06 DIAGNOSIS — I350 Nonrheumatic aortic (valve) stenosis: Secondary | ICD-10-CM | POA: Diagnosis not present

## 2019-02-06 MED ORDER — NITROGLYCERIN 0.4 MG SL SUBL: 0.4000 mg | SUBLINGUAL_TABLET | SUBLINGUAL | 3 refills | Status: DC | PRN

## 2019-02-06 MED ORDER — SPIRONOLACTONE 25 MG PO TABS: 12.5000 mg | ORAL_TABLET | Freq: Every day | ORAL | 1 refills | Status: DC
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
## 2019-02-06 NOTE — Telephone Encounter (Signed)
Spoke with the patient, instructions given. He stated that he would be here for his test. Asked to call back with any questions. Matthew [name] EMTP 

## 2019-02-06 NOTE — Patient Instructions (Signed)
Medication Instructions:  Your physician has recommended you make the following change in your medication:   STOP:Amlodipine   START: Spironolactone 25 mg Take 1/2 tab daily  Nitroglycerin 0.4 mg sublingual (under your tongue) as needed for chest pain. If experiencing chest pain, stop what you are doing and sit down. Take 1 nitroglycerin and wait 5 minutes. If chest pain continues, take another nitroglycerin and wait 5 minutes. If chest pain does not subside, take 1 more nitroglycerin and dial 911. You make take a total of 3 nitroglycerin in a 15 minute time frame.   *If you need a refill on your cardiac medications before your next appointment, please call your pharmacy*  Lab Work: Your physician recommends that you return for lab work in:  2 WEEKS BMP  If you have labs (blood work) drawn today and your tests are completely normal, you will receive your results only by: Marland Kitchen MyChart Message (if you have MyChart) OR . A paper copy in the mail If you have any lab test that is abnormal or we need to change your treatment, we will call you to review the results.  Testing/Procedures: Your physician has requested that you have a lexiscan myoview. For further information please visit [email or web address]. Please follow instruction sheet, as given.   Follow-Up: At [facility], you and your health needs are our priority.  As part of our continuing mission to provide you with exceptional heart care, we have created designated Provider Care Teams.  These Care Teams include your primary Cardiologist (physician) and Advanced Practice Providers (APPs -  Physician Assistants and Nurse Practitioners) who all work together to provide you with the care you need, when you need it.  Your next appointment:   3 month(s)  The format for your next appointment:   In Person  Provider:   Berniece Salines, DO  Other Instructions  Cardiac Nuclear Scan A cardiac nuclear scan is a test that is done to check the  flow of blood to your heart. It is done when you are resting and when you are exercising. The test looks for problems such as:  Not enough blood reaching a portion of the heart.  The heart muscle not working as it should. You may need this test if:  You have heart disease.  You have had lab results that are not normal.  You have had heart surgery or a balloon procedure to open up blocked arteries (angioplasty).  You have chest pain.  You have shortness of breath. In this test, a special dye (tracer) is put into your bloodstream. The tracer will travel to your heart. A camera will then take pictures of your heart to see how the tracer moves through your heart. This test is usually done at a hospital and takes 2-4 hours. Tell a doctor about:  Any allergies you have.  All medicines you are taking, including vitamins, herbs, eye drops, creams, and over-the-counter medicines.  Any problems you or family members have had with anesthetic medicines.  Any blood disorders you have.  Any surgeries you have had.  Any medical conditions you have.  Whether you are pregnant or may be pregnant. What are the risks? Generally, this is a safe test. However, problems may occur, such as:  Serious chest pain and heart attack. This is only a risk if the stress portion of the test is done.  Rapid heartbeat.  A feeling of warmth in your chest. This feeling usually does not last long.  Allergic reaction  to the tracer. What happens before the test?  Ask your doctor about changing or stopping your normal medicines. This is important.  Follow instructions from your doctor about what you cannot eat or drink.  Remove your jewelry on the day of the test. What happens during the test?  An IV tube will be inserted into one of your veins.  Your doctor will give you a small amount of tracer through the IV tube.  You will wait for 20-40 minutes while the tracer moves through your  bloodstream.  Your heart will be monitored with an electrocardiogram (ECG).  You will lie down on an exam table.  Pictures of your heart will be taken for about 15-20 minutes.  You may also have a stress test. For this test, one of these things may be done: ? You will be asked to exercise on a treadmill or a stationary bike. ? You will be given medicines that will make your heart work harder. This is done if you are unable to exercise.  When blood flow to your heart has peaked, a tracer will again be given through the IV tube.  After 20-40 minutes, you will get back on the exam table. More pictures will be taken of your heart.  Depending on the tracer that is used, more pictures may need to be taken 3-4 hours later.  Your IV tube will be removed when the test is over. The test may vary among doctors and hospitals. What happens after the test?  Ask your doctor: ? Whether you can return to your normal schedule, including diet, activities, and medicines. ? Whether you should drink more fluids. This will help to remove the tracer from your body. Drink enough fluid to keep your pee (urine) pale yellow.  Ask your doctor, or the department that is doing the test: ? When will my results be ready? ? How will I get my results? Summary  A cardiac nuclear scan is a test that is done to check the flow of blood to your heart.  Tell your doctor whether you are pregnant or may be pregnant.  Before the test, ask your doctor about changing or stopping your normal medicines. This is important.  Ask your doctor whether you can return to your normal activities. You may be asked to drink more fluids. This information is not intended to replace advice given to you by your health care provider. Make sure you discuss any questions you have with your health care provider. Document Revised: 05/11/2018 Document Reviewed: 07/05/2017 Elsevier Patient Education  [place].

## 2019-02-09 ENCOUNTER — Other Ambulatory Visit: Payer: Self-pay

## 2019-02-09 ENCOUNTER — Ambulatory Visit (HOSPITAL_COMMUNITY): Payer: Medicare HMO | Attending: Cardiology

## 2019-02-09 VITALS — Ht 68.0 in | Wt 216.0 lb

## 2019-02-09 DIAGNOSIS — R0600 Dyspnea, unspecified: Secondary | ICD-10-CM | POA: Insufficient documentation

## 2019-02-09 DIAGNOSIS — R079 Chest pain, unspecified: Secondary | ICD-10-CM | POA: Insufficient documentation

## 2019-02-09 DIAGNOSIS — R0609 Other forms of dyspnea: Secondary | ICD-10-CM

## 2019-02-09 LAB — MYOCARDIAL PERFUSION IMAGING
LV dias vol: 94 mL (ref 62–150)
LV sys vol: 40 mL
Peak HR: 99 {beats}/min
Rest HR: 80 {beats}/min
SDS: 1
SRS: 0
SSS: 1
TID: 1.17

## 2019-02-09 MED ORDER — TECHNETIUM TC 99M TETROFOSMIN IV KIT
10.6000 | PACK | Freq: Once | INTRAVENOUS | Status: AC | PRN
  Administered 2019-02-09: 10.6 via INTRAVENOUS
  Filled 2019-02-09: qty 11

## 2019-02-09 MED ORDER — TECHNETIUM TC 99M TETROFOSMIN IV KIT
31.7000 | PACK | Freq: Once | INTRAVENOUS | Status: AC | PRN
  Administered 2019-02-09: 31.7 via INTRAVENOUS
  Filled 2019-02-09: qty 32

## 2019-02-09 MED ORDER — REGADENOSON 0.4 MG/5ML IV SOLN
0.4000 mg | Freq: Once | INTRAVENOUS | Status: AC
  Administered 2019-02-09: 0.4 mg via INTRAVENOUS

## 2019-02-16 DIAGNOSIS — J343 Hypertrophy of nasal turbinates: Secondary | ICD-10-CM | POA: Diagnosis not present

## 2019-02-20 DIAGNOSIS — I10 Essential (primary) hypertension: Secondary | ICD-10-CM | POA: Diagnosis not present

## 2019-02-20 DIAGNOSIS — R079 Chest pain, unspecified: Secondary | ICD-10-CM | POA: Diagnosis not present

## 2019-02-20 DIAGNOSIS — R0600 Dyspnea, unspecified: Secondary | ICD-10-CM | POA: Diagnosis not present

## 2019-02-21 LAB — BASIC METABOLIC PANEL
BUN/Creatinine Ratio: 17 (ref 10–24)
BUN: 16 mg/dL (ref 8–27)
CO2: 24 mmol/L (ref 20–29)
Calcium: 9.9 mg/dL (ref 8.6–10.2)
Chloride: 107 mmol/L — ABNORMAL HIGH (ref 96–106)
Creatinine, Ser: 0.93 mg/dL (ref 0.76–1.27)
GFR calc Af Amer: 89 mL/min/{1.73_m2} (ref >59)
GFR calc non Af Amer: 77 mL/min/{1.73_m2} (ref >59)
Glucose: 116 mg/dL — ABNORMAL HIGH (ref 65–99)
Potassium: 4 mmol/L (ref 3.5–5.2)
Sodium: 141 mmol/L (ref 134–144)

## 2019-02-22 DIAGNOSIS — J343 Hypertrophy of nasal turbinates: Secondary | ICD-10-CM | POA: Diagnosis not present

## 2019-02-22 DIAGNOSIS — Z20822 Contact with and (suspected) exposure to covid-19: Secondary | ICD-10-CM | POA: Diagnosis not present

## 2019-02-22 DIAGNOSIS — Z01812 Encounter for preprocedural laboratory examination: Secondary | ICD-10-CM | POA: Diagnosis not present

## 2019-02-28 DIAGNOSIS — Z87891 Personal history of nicotine dependence: Secondary | ICD-10-CM | POA: Diagnosis not present

## 2019-02-28 DIAGNOSIS — J343 Hypertrophy of nasal turbinates: Secondary | ICD-10-CM | POA: Diagnosis not present

## 2019-03-20 ENCOUNTER — Telehealth: Payer: Self-pay | Admitting: Cardiology

## 2019-03-20 NOTE — Telephone Encounter (Signed)
New Message   Pt c/o medication issue:  1. Name of Medication: spironolactone (ALDACTONE) 25 MG tablet    2. How are you currently taking this medication (dosage and times per day)? Take 0.5 tablets (12.5 mg total) by mouth daily.  3. Are you having a reaction (difficulty breathing--STAT)? Some dizziness  4. What is your medication issue? Patient states that he has noticed an increase in his BP since being on this medication. His BP on Friday was 146/79  He is requesting to go back on the amlodopine.

## 2019-03-20 NOTE — Telephone Encounter (Signed)
Advised patient, verbalized understanding  

## 2019-03-20 NOTE — Telephone Encounter (Signed)
I do not think that his antihypertensive medication change has anything to do with his belching.  I stopped amlodipine because I thought that was causing his leg edema.  I would really like him to see his PCP as he might need some medication for indigestion.  In the meantime it would be helpful if he can please take his blood pressure at home so I can be able to assess any need of titrating his antihypertensive medication.

## 2019-03-20 NOTE — Telephone Encounter (Signed)
Spoke with patient and he saw Dr Harriet Masson in January. Per patient his Amlodipine was stopped and Spironolactone started 1/2 daily, he thought because of belching. His swelling in his legs is the same since medication change but his belching has gotten worse. He is very concerned about his belching and wants to know what to do, asking if he should go back on Amlodipine since worse. He does not check his blood pressure at home. Will forward to Dr Harriet Masson for review

## 2019-03-23 ENCOUNTER — Encounter: Payer: Self-pay | Admitting: Family Medicine

## 2019-03-23 ENCOUNTER — Other Ambulatory Visit: Payer: Self-pay

## 2019-03-23 ENCOUNTER — Ambulatory Visit (INDEPENDENT_AMBULATORY_CARE_PROVIDER_SITE_OTHER): Payer: Medicare HMO | Admitting: Family Medicine

## 2019-03-23 DIAGNOSIS — K5900 Constipation, unspecified: Secondary | ICD-10-CM | POA: Diagnosis not present

## 2019-03-23 DIAGNOSIS — K219 Gastro-esophageal reflux disease without esophagitis: Secondary | ICD-10-CM | POA: Diagnosis not present

## 2019-03-23 DIAGNOSIS — I10 Essential (primary) hypertension: Secondary | ICD-10-CM | POA: Diagnosis not present

## 2019-03-23 DIAGNOSIS — J343 Hypertrophy of nasal turbinates: Secondary | ICD-10-CM | POA: Diagnosis not present

## 2019-03-23 MED ORDER — FAMOTIDINE 20 MG PO TABS: 20.0000 mg | ORAL_TABLET | Freq: Every day | ORAL | 1 refills | Status: DC

## 2019-03-23 NOTE — Assessment & Plan Note (Signed)
Well controlled, no changes to meds. Encouraged heart healthy diet such as the DASH diet and exercise as tolerated.  °

## 2019-03-23 NOTE — Assessment & Plan Note (Addendum)
Monitor and report any concerns.no changes to meds. Encouraged heart healthy diet such as the DASH diet and exercise as tolerated. His Amlodipine was stopped as they thought it may be contributing to his pedal edema but that has not improved. Is now taking Losartan 25 mg q am and Spironolactone 12.5 mg daily

## 2019-03-23 NOTE — Progress Notes (Signed)
Keep belching all the time, he said its not only when he drinks or eats he says its all the time, he has taking extra antiacids to seems to help somewhat, "If I walk up the hall I'm in big trouble"  He says this appointment was reccommended by Dr. Harriet Masson

## 2019-03-23 NOTE — Assessment & Plan Note (Signed)
Avoid offending foods, start probiotics. Do not eat large meals in late evening and consider raising head of bed.  He has noted belching especially with exertion since his nasal surgery. Will try adding Famotidine 20 mg qhs and he will avoid high fat and spicy foods and report if no improvement so we can consider referral to gastroenterology.
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
## 2019-03-23 NOTE — Assessment & Plan Note (Signed)
Mild and likely brought on by dehydration. He hs cut down on his water intake significantly so he does not have to get up at night so many times. He limits himself to 4 glasses of water and quits at 3pm. Encouraged to increase and add Benefiber and probiotics daily

## 2019-03-23 NOTE — Assessment & Plan Note (Addendum)
He underwent surgery to reduce obstruction but he still feels obstructed. Had some trouble with epistaxis but that has resolved. He no longer needs nose sprays. Dehydration may be contributing to congestion, he will increase intake

## 2019-03-24 ENCOUNTER — Other Ambulatory Visit: Payer: Self-pay

## 2019-03-24 ENCOUNTER — Ambulatory Visit (INDEPENDENT_AMBULATORY_CARE_PROVIDER_SITE_OTHER): Payer: Medicare HMO | Admitting: Podiatry

## 2019-03-24 ENCOUNTER — Encounter: Payer: Self-pay | Admitting: Podiatry

## 2019-03-24 DIAGNOSIS — M79675 Pain in left toe(s): Secondary | ICD-10-CM

## 2019-03-24 DIAGNOSIS — M79674 Pain in right toe(s): Secondary | ICD-10-CM | POA: Diagnosis not present

## 2019-03-24 DIAGNOSIS — B351 Tinea unguium: Secondary | ICD-10-CM

## 2019-03-24 NOTE — Progress Notes (Signed)
  Subjective:  Patient ID: Matthew [name], male    DOB: Aug 28, 1935,  MRN: [id number]  Chief Complaint  Patient presents with  . Nail Problem    pt is here for a nail trim. Pt also has no other comments or concerns.   84 y.o. male returns for the above complaint.  Patient is here for painful thickened elongated mycotic toenails x10.  Patient states that he has not been able to cut his own toenails because they are gone so dystrophic.  Patient states that he is not able to cut it them.  He also wanted to discuss further management of onychomycosis discussed various treatment options.  Objective:  There were no vitals filed for this visit. Podiatric Exam: Vascular: dorsalis pedis and posterior tibial pulses are palpable bilateral. Capillary return is immediate. Temperature gradient is WNL. Skin turgor WNL  Sensorium: Normal Semmes Weinstein monofilament test. Normal tactile sensation bilaterally. Nail Exam: Pt has thick disfigured discolored nails with subungual debris noted bilateral entire nail hallux through fifth toenails Ulcer Exam: There is no evidence of ulcer or pre-ulcerative changes or infection. Orthopedic Exam: Muscle tone and strength are WNL. No limitations in general ROM. No crepitus or effusions noted. HAV  B/L.  Hammer toes 2-5  B/L. Skin: No Porokeratosis. No infection or ulcers  Assessment & Plan:  Patient was evaluated and treated and all questions answered.  Onychomycosis with pain  -Nails palliatively debrided as below. -Educated on self-care  Nail fungus -Educated the patient on the etiology of onychomycosis and various treatment options associated with improving the fungal load.  I explained to the patient that there is 3 treatment options available to treat the onychomycosis including topical, p.o., laser treatment.  At this time patient will hold off treating the fungus.   Procedure: Nail Debridement Rationale: pain  Type of Debridement: manual, sharp  debridement. Instrumentation: Nail nipper, rotary burr. Number of Nails: 10  Procedures and Treatment: Consent by patient was obtained for treatment procedures. The patient understood the discussion of treatment and procedures well. All questions were answered thoroughly reviewed. Debridement of mycotic and hypertrophic toenails, 1 through 5 bilateral and clearing of subungual debris. No ulceration, no infection noted.  Return Visit-Office Procedure: Patient instructed to return to the office for a follow up visit 3 months for continued evaluation and treatment.  Boneta Lucks, DPM    No follow-ups on file.

## 2019-04-10 ENCOUNTER — Other Ambulatory Visit: Payer: Self-pay | Admitting: Family Medicine

## 2019-04-10 ENCOUNTER — Telehealth: Payer: Self-pay | Admitting: Family Medicine

## 2019-04-10 DIAGNOSIS — K219 Gastro-esophageal reflux disease without esophagitis: Secondary | ICD-10-CM

## 2019-04-10 DIAGNOSIS — R142 Eructation: Secondary | ICD-10-CM

## 2019-04-10 NOTE — Telephone Encounter (Signed)
Please let him know I have sent gastro referral

## 2019-04-10 NOTE — Telephone Encounter (Signed)
Please advise 

## 2019-04-10 NOTE — Telephone Encounter (Signed)
Patient states that he condition with burps has worsen. Patient would like some else to take or a referral to [facility].   Please Advise

## 2019-04-11 ENCOUNTER — Encounter: Payer: Self-pay | Admitting: Nurse Practitioner

## 2019-04-11 NOTE — Telephone Encounter (Signed)
Unable to leave voicemail for patient

## 2019-04-12 ENCOUNTER — Telehealth: Payer: Self-pay | Admitting: Cardiology

## 2019-04-12 NOTE — Telephone Encounter (Signed)
Patient states he has been experiencing symptoms of burping when walking. He states he has not experienced any other symptoms. Please advise.

## 2019-04-12 NOTE — Telephone Encounter (Signed)
No answer no vm

## 2019-04-12 NOTE — Telephone Encounter (Signed)
Patient states that his burping has gotten worse but other than that he is doing great. He is going to see a GI doctor this Friday.

## 2019-04-14 ENCOUNTER — Telehealth: Payer: Self-pay | Admitting: Family Medicine

## 2019-04-14 ENCOUNTER — Encounter: Payer: Self-pay | Admitting: Nurse Practitioner

## 2019-04-14 ENCOUNTER — Ambulatory Visit: Payer: Medicare HMO | Admitting: Nurse Practitioner

## 2019-04-14 ENCOUNTER — Telehealth: Payer: Self-pay | Admitting: Nurse Practitioner

## 2019-04-14 VITALS — BP 134/64 | HR 80 | Temp 97.9°F | Ht 68.0 in | Wt 214.4 lb

## 2019-04-14 DIAGNOSIS — R142 Eructation: Secondary | ICD-10-CM | POA: Diagnosis not present

## 2019-04-14 MED ORDER — FENOFIBRATE 134 MG PO CAPS: ORAL_CAPSULE | ORAL | 2 refills | Status: DC

## 2019-04-14 NOTE — Telephone Encounter (Signed)
Bre, pls contact the patient's PCP for copy of patient's past colonoscopies, last one was possibly in 2006 and Cologuard test possibly done 3 to 4 years ago. thx
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
## 2019-04-14 NOTE — Patient Instructions (Addendum)
If you are age 82 or older, your body mass index should be between 23-30. Your Body mass index is 32.6 kg/m. If this is out of the aforementioned range listed, please consider follow up with your Primary Care Provider.  If you are age 70 or younger, your body mass index should be between 19-25. Your Body mass index is 32.6 kg/m. If this is out of the aformentioned range listed, please consider follow up with your Primary Care Provider.   Take lactaid 1-2 tablets with each dairy product Avoid carbonated beverages Increase your Famotadine 20 mg, to two tablets at bedtime. Follow up with your Primary care physician regarding anxiety and possible sleep apnea Call our office in 2 months to schedule a follow up  Due to recent changes in healthcare laws, you may see the results of your imaging and laboratory studies on MyChart before your provider has had a chance to review them.  We understand that in some cases there may be results that are confusing or concerning to you. Not all laboratory results come back in the same time frame and the provider may be waiting for multiple results in order to interpret others.  Please give [place] 48 hours in order for your provider to thoroughly review all the results before contacting the office for clarification of your results.   Thank you for choosing [facility] Noralyn Pick, CRNP

## 2019-04-14 NOTE — Telephone Encounter (Signed)
Records request has been faxed to PCP office;

## 2019-04-14 NOTE — Telephone Encounter (Signed)
Patient has changed Pharmacy to [facility]. [place]  [id number]  [place] [address]   fenofibrate micronized (LOFIBRA) 134 MG capsule [id number]   Patient states that [facility] doesn't not care 134mg . Please Advise

## 2019-04-14 NOTE — Telephone Encounter (Signed)
Medication sent to patient's pharmacy.

## 2019-04-16 NOTE — Progress Notes (Signed)
I agree with the above note, plan 

## 2019-04-17 ENCOUNTER — Other Ambulatory Visit: Payer: Self-pay | Admitting: *Deleted

## 2019-04-17 MED ORDER — OMEPRAZOLE 20 MG PO CPDR: DELAYED_RELEASE_CAPSULE | ORAL | 3 refills | Status: DC

## 2019-04-18 DIAGNOSIS — Z85828 Personal history of other malignant neoplasm of skin: Secondary | ICD-10-CM | POA: Diagnosis not present

## 2019-04-18 DIAGNOSIS — I788 Other diseases of capillaries: Secondary | ICD-10-CM | POA: Diagnosis not present

## 2019-04-18 DIAGNOSIS — L821 Other seborrheic keratosis: Secondary | ICD-10-CM | POA: Diagnosis not present

## 2019-04-18 DIAGNOSIS — L570 Actinic keratosis: Secondary | ICD-10-CM | POA: Diagnosis not present

## 2019-04-18 DIAGNOSIS — L738 Other specified follicular disorders: Secondary | ICD-10-CM | POA: Diagnosis not present

## 2019-04-19 NOTE — Telephone Encounter (Signed)
Records request has been re-faxed; waiting on response;

## 2019-04-24 NOTE — Telephone Encounter (Signed)
Please follow up on this request

## 2019-04-26 NOTE — Telephone Encounter (Signed)
2nd request-Faxed records release to patients PCP for records on colonoscopy and cologuard testing.

## 2019-05-05 ENCOUNTER — Ambulatory Visit (INDEPENDENT_AMBULATORY_CARE_PROVIDER_SITE_OTHER): Payer: Medicare HMO | Admitting: Cardiology

## 2019-05-05 ENCOUNTER — Other Ambulatory Visit: Payer: Self-pay

## 2019-05-05 ENCOUNTER — Encounter: Payer: Self-pay | Admitting: Cardiology

## 2019-05-05 VITALS — BP 122/70 | HR 76 | Ht 68.0 in | Wt 212.0 lb

## 2019-05-05 DIAGNOSIS — R7303 Prediabetes: Secondary | ICD-10-CM

## 2019-05-05 DIAGNOSIS — R079 Chest pain, unspecified: Secondary | ICD-10-CM | POA: Diagnosis not present

## 2019-05-05 DIAGNOSIS — I350 Nonrheumatic aortic (valve) stenosis: Secondary | ICD-10-CM | POA: Diagnosis not present

## 2019-05-05 DIAGNOSIS — E782 Mixed hyperlipidemia: Secondary | ICD-10-CM | POA: Diagnosis not present

## 2019-05-05 DIAGNOSIS — I10 Essential (primary) hypertension: Secondary | ICD-10-CM

## 2019-05-05 DIAGNOSIS — R072 Precordial pain: Secondary | ICD-10-CM

## 2019-05-05 NOTE — Patient Instructions (Addendum)
Medication Instructions:  No medication changes *If you need a refill on your cardiac medications before your next appointment, please call your pharmacy*   Lab Work: Your physician recommends that you have a BMET today in the office.    If you have labs (blood work) drawn today and your tests are completely normal, you will receive your results only by: Marland Kitchen MyChart Message (if you have MyChart) OR . A paper copy in the mail If you have any lab test that is abnormal or we need to change your treatment, we will call you to review the results.   Testing/Procedures: Your cardiac CT will be scheduled at one of the below locations:   [facility] [address] [place], [place] [address] [phone]  If scheduled at [facility], please arrive at the [facility] main entrance of [facility] 30 minutes prior to test start time. Proceed to the [facility] Radiology Department (first floor) to check-in and test prep.   Please follow these instructions carefully (unless otherwise directed):  Hold all erectile dysfunction medications at least 3 days (72 hrs) prior to test.  On the Night Before the Test: . Be sure to Drink plenty of water. . Do not consume any caffeinated/decaffeinated beverages or chocolate 12 hours prior to your test. . Do not take any antihistamines 12 hours prior to your test.  On the Day of the Test: . Drink plenty of water. Do not drink any water within one hour of the test. . Do not eat any food 4 hours prior to the test. . You may take your regular medications prior to the test.  . Take metoprolol (Lopressor) two hours prior to test.       After the Test: . Drink plenty of water. . After receiving IV contrast, you may experience a mild flushed feeling. This is normal. . On occasion, you may experience a mild rash up to 24 hours after the test. This is not dangerous. If this occurs, you can take Benadryl 25 mg and increase your  fluid intake. . If you experience trouble breathing, this can be serious. If it is severe call 911 IMMEDIATELY. If it is mild, please call our office. . If you take any of these medications: Glipizide/Metformin, Avandament, Glucavance, please do not take 48 hours after completing test unless otherwise instructed.   Once we have confirmed authorization from your insurance company, we will call you to set up a date and time for your test.   For non-scheduling related questions, please contact the cardiac imaging nurse navigator should you have any questions/concerns: Marchia Bond, RN Navigator Cardiac Imaging [facility] [phone] office  For scheduling needs, including cancellations and rescheduling, please call [phone].     Follow-Up: At [facility], you and your health needs are our priority.  As part of our continuing mission to provide you with exceptional heart care, we have created designated Provider Care Teams.  These Care Teams include your primary Cardiologist (physician) and Advanced Practice Providers (APPs -  Physician Assistants and Nurse Practitioners) who all work together to provide you with the care you need, when you need it.  We recommend signing up for the patient portal called "MyChart".  Sign up information is provided on this After Visit Summary.  MyChart is used to connect with patients for Virtual Visits (Telemedicine).  Patients are able to view lab/test results, encounter notes, upcoming appointments, etc.  Non-urgent messages can be  sent to your provider as well.   To learn more about what you can do with MyChart, go to [email or web address].    Your next appointment:   3 month(s)  The format for your next appointment:   In Person  Provider:   Berniece Salines, DO   Other Instructions NA
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
## 2019-05-06 LAB — BASIC METABOLIC PANEL
BUN/Creatinine Ratio: 13 (ref 10–24)
BUN: 13 mg/dL (ref 8–27)
CO2: 25 mmol/L (ref 20–29)
Calcium: 9.9 mg/dL (ref 8.6–10.2)
Chloride: 101 mmol/L (ref 96–106)
Creatinine, Ser: 1.02 mg/dL (ref 0.76–1.27)
GFR calc Af Amer: 79 mL/min/{1.73_m2} (ref >59)
GFR calc non Af Amer: 69 mL/min/{1.73_m2} (ref >59)
Glucose: 117 mg/dL — ABNORMAL HIGH (ref 65–99)
Potassium: 4.5 mmol/L (ref 3.5–5.2)
Sodium: 140 mmol/L (ref 134–144)

## 2019-05-08 ENCOUNTER — Telehealth: Payer: Self-pay

## 2019-05-08 NOTE — Telephone Encounter (Signed)
Left message on patients voicemail and let him know that his labs were stable.   Encouraged patient to call back with any questions or concerns.

## 2019-05-08 NOTE — Telephone Encounter (Signed)
-----   Message from Berniece Salines, DO sent at 05/06/2019  2:36 PM EDT ----- Labs stable.

## 2019-05-11 DIAGNOSIS — H00025 Hordeolum internum left lower eyelid: Secondary | ICD-10-CM | POA: Diagnosis not present

## 2019-05-11 DIAGNOSIS — Z961 Presence of intraocular lens: Secondary | ICD-10-CM | POA: Diagnosis not present

## 2019-05-11 DIAGNOSIS — H26492 Other secondary cataract, left eye: Secondary | ICD-10-CM | POA: Diagnosis not present

## 2019-05-11 DIAGNOSIS — H0102B Squamous blepharitis left eye, upper and lower eyelids: Secondary | ICD-10-CM | POA: Diagnosis not present

## 2019-05-16 ENCOUNTER — Telehealth: Payer: Self-pay

## 2019-05-16 NOTE — Telephone Encounter (Signed)
Patient called in to see if Dr. Charlett Blake could call in a prescription for a 90 day supply for famotidine (PEPCID) 20 MG tablet [id number]   Please send it to [facility] [place] - [address]  [address], [place] [address]  Phone:  [phone] Fax:  [phone]  DEA #:  --

## 2019-05-22 MED ORDER — FAMOTIDINE 20 MG PO TABS: 20.0000 mg | ORAL_TABLET | Freq: Two times a day (BID) | ORAL | 1 refills | Status: DC

## 2019-05-22 NOTE — Telephone Encounter (Signed)
Medications have been sent

## 2019-06-12 ENCOUNTER — Other Ambulatory Visit: Payer: Self-pay | Admitting: *Deleted

## 2019-06-12 DIAGNOSIS — I10 Essential (primary) hypertension: Secondary | ICD-10-CM

## 2019-06-12 MED ORDER — LOSARTAN POTASSIUM 25 MG PO TABS: 25.0000 mg | ORAL_TABLET | Freq: Every day | ORAL | 1 refills | Status: DC

## 2019-06-21 ENCOUNTER — Encounter: Payer: Self-pay | Admitting: Cardiology

## 2019-06-22 ENCOUNTER — Ambulatory Visit (INDEPENDENT_AMBULATORY_CARE_PROVIDER_SITE_OTHER): Payer: Medicare HMO | Admitting: Family Medicine

## 2019-06-22 ENCOUNTER — Other Ambulatory Visit: Payer: Self-pay

## 2019-06-22 DIAGNOSIS — G4733 Obstructive sleep apnea (adult) (pediatric): Secondary | ICD-10-CM | POA: Diagnosis not present

## 2019-06-22 DIAGNOSIS — E785 Hyperlipidemia, unspecified: Secondary | ICD-10-CM

## 2019-06-22 DIAGNOSIS — R0789 Other chest pain: Secondary | ICD-10-CM | POA: Diagnosis not present

## 2019-06-22 DIAGNOSIS — I10 Essential (primary) hypertension: Secondary | ICD-10-CM

## 2019-06-22 DIAGNOSIS — K219 Gastro-esophageal reflux disease without esophagitis: Secondary | ICD-10-CM | POA: Diagnosis not present

## 2019-06-22 DIAGNOSIS — R739 Hyperglycemia, unspecified: Secondary | ICD-10-CM | POA: Diagnosis not present

## 2019-06-22 LAB — COMPREHENSIVE METABOLIC PANEL
ALT: 18 U/L (ref 0–53)
AST: 18 U/L (ref 0–37)
Albumin: 4.3 g/dL (ref 3.5–5.2)
Alkaline Phosphatase: 52 U/L (ref 39–117)
BUN: 17 mg/dL (ref 6–23)
CO2: 27 mEq/L (ref 19–32)
Calcium: 9.5 mg/dL (ref 8.4–10.5)
Chloride: 102 mEq/L (ref 96–112)
Creatinine, Ser: 0.97 mg/dL (ref 0.40–1.50)
GFR: 74.12 mL/min (ref >60.00)
Glucose, Bld: 105 mg/dL — ABNORMAL HIGH (ref 70–99)
Potassium: 4 mEq/L (ref 3.5–5.1)
Sodium: 134 mEq/L — ABNORMAL LOW (ref 135–145)
Total Bilirubin: 0.5 mg/dL (ref 0.2–1.2)
Total Protein: 6.7 g/dL (ref 6.0–8.3)

## 2019-06-22 LAB — CBC
HCT: 39.8 % (ref 39.0–52.0)
Hemoglobin: 13.5 g/dL (ref 13.0–17.0)
MCHC: 34 g/dL (ref 30.0–36.0)
MCV: 87.3 fl (ref 78.0–100.0)
Platelets: 206 10*3/uL (ref 150.0–400.0)
RBC: 4.56 Mil/uL (ref 4.22–5.81)
RDW: 13.9 % (ref 11.5–15.5)
WBC: 7.4 10*3/uL (ref 4.0–10.5)

## 2019-06-22 LAB — LIPID PANEL
Cholesterol: 234 mg/dL — ABNORMAL HIGH (ref 0–200)
HDL: 41.2 mg/dL (ref >39.00)
LDL Cholesterol: 176 mg/dL — ABNORMAL HIGH (ref 0–99)
NonHDL: 192.93
Total CHOL/HDL Ratio: 6
Triglycerides: 86 mg/dL (ref 0.0–149.0)
VLDL: 17.2 mg/dL (ref 0.0–40.0)

## 2019-06-22 LAB — TSH: TSH: 1.57 u[IU]/mL (ref 0.35–4.50)

## 2019-06-22 LAB — HEMOGLOBIN A1C: Hgb A1c MFr Bld: 6.3 % (ref 4.6–6.5)

## 2019-06-22 NOTE — Assessment & Plan Note (Signed)
hgba1c acceptable, minimize simple carbs. Increase exercise as tolerated.  

## 2019-06-22 NOTE — Assessment & Plan Note (Addendum)
Avoid offending foods, start probiotics. Do not eat large meals in late evening and consider raising head of bed. Is taking 40 mg of Famotidine qhs and that has helped the reflux and burping

## 2019-06-22 NOTE — Assessment & Plan Note (Signed)
Encouraged heart healthy diet, increase exercise, avoid trans fats, consider a krill oil cap daily 

## 2019-06-22 NOTE — Progress Notes (Signed)
q 

## 2019-06-22 NOTE — Assessment & Plan Note (Signed)
Well controlled, no changes to meds. Encouraged heart healthy diet such as the DASH diet and exercise as tolerated.  °
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
## 2019-06-22 NOTE — Assessment & Plan Note (Addendum)
Had surgery in February with Dr Laurance Flatten ENT they reduced the inferior turbinates. He continues to snore but it still persists. He is sleeping some better. He still struggles with snoring and fatigue. He is referred to pulmonology for further consideration.

## 2019-06-22 NOTE — Patient Instructions (Signed)
Sleep Apnea Sleep apnea is a condition in which breathing pauses or becomes shallow during sleep. Episodes of sleep apnea usually last 10 seconds or longer, and they may occur as many as 20 times an hour. Sleep apnea disrupts your sleep and keeps your body from getting the rest that it needs. This condition can increase your risk of certain health problems, including:  Heart attack.  Stroke.  Obesity.  Diabetes.  Heart failure.  Irregular heartbeat. What are the causes? There are three kinds of sleep apnea:  Obstructive sleep apnea. This kind is caused by a blocked or collapsed airway.  Central sleep apnea. This kind happens when the part of the brain that controls breathing does not send the correct signals to the muscles that control breathing.  Mixed sleep apnea. This is a combination of obstructive and central sleep apnea. The most common cause of this condition is a collapsed or blocked airway. An airway can collapse or become blocked if:  Your throat muscles are abnormally relaxed.  Your tongue and tonsils are larger than normal.  You are overweight.  Your airway is smaller than normal. What increases the risk? You are more likely to develop this condition if you:  Are overweight.  Smoke.  Have a smaller than normal airway.  Are elderly.  Are male.  Drink alcohol.  Take sedatives or tranquilizers.  Have a family history of sleep apnea. What are the signs or symptoms? Symptoms of this condition include:  Trouble staying asleep.  Daytime sleepiness and tiredness.  Irritability.  Loud snoring.  Morning headaches.  Trouble concentrating.  Forgetfulness.  Decreased interest in sex.  Unexplained sleepiness.  Mood swings.  Personality changes.  Feelings of depression.  Waking up often during the night to urinate.  Dry mouth.  Sore throat. How is this diagnosed? This condition may be diagnosed with:  A medical history.  A physical  exam.  A series of tests that are done while you are sleeping (sleep study). These tests are usually done in a sleep lab, but they may also be done at home. How is this treated? Treatment for this condition aims to restore normal breathing and to ease symptoms during sleep. It may involve managing health issues that can affect breathing, such as high blood pressure or obesity. Treatment may include:  Sleeping on your side.  Using a decongestant if you have nasal congestion.  Avoiding the use of depressants, including alcohol, sedatives, and narcotics.  Losing weight if you are overweight.  Making changes to your diet.  Quitting smoking.  Using a device to open your airway while you sleep, such as: ? An oral appliance. This is a custom-made mouthpiece that shifts your lower jaw forward. ? A continuous positive airway pressure (CPAP) device. This device blows air through a mask when you breathe out (exhale). ? A nasal expiratory positive airway pressure (EPAP) device. This device has valves that you put into each nostril. ? A bi-level positive airway pressure (BPAP) device. This device blows air through a mask when you breathe in (inhale) and breathe out (exhale).  Having surgery if other treatments do not work. During surgery, excess tissue is removed to create a wider airway. It is important to get treatment for sleep apnea. Without treatment, this condition can lead to:  High blood pressure.  Coronary artery disease.  In men, an inability to achieve or maintain an erection (impotence).  Reduced thinking abilities. Follow these instructions at home: Lifestyle  Make any lifestyle changes   that your health care provider recommends.  Eat a healthy, well-balanced diet.  Take steps to lose weight if you are overweight.  Avoid using depressants, including alcohol, sedatives, and narcotics.  Do not use any products that contain nicotine or tobacco, such as cigarettes,  e-cigarettes, and chewing tobacco. If you need help quitting, ask your health care provider. General instructions  Take over-the-counter and prescription medicines only as told by your health care provider.  If you were given a device to open your airway while you sleep, use it only as told by your health care provider.  If you are having surgery, make sure to tell your health care provider you have sleep apnea. You may need to bring your device with you.  Keep all follow-up visits as told by your health care provider. This is important. Contact a health care provider if:  The device that you received to open your airway during sleep is uncomfortable or does not seem to be working.  Your symptoms do not improve.  Your symptoms get worse. Get help right away if:  You develop: ? Chest pain. ? Shortness of breath. ? Discomfort in your back, arms, or stomach.  You have: ? Trouble speaking. ? Weakness on one side of your body. ? Drooping in your face. These symptoms may represent a serious problem that is an emergency. Do not wait to see if the symptoms will go away. Get medical help right away. Call your local emergency services (911 in the [place] Do not drive yourself to the hospital. Summary  Sleep apnea is a condition in which breathing pauses or becomes shallow during sleep.  The most common cause is a collapsed or blocked airway.  The goal of treatment is to restore normal breathing and to ease symptoms during sleep. This information is not intended to replace advice given to you by your health care provider. Make sure you discuss any questions you have with your health care provider. Document Revised: 07/06/2018 Document Reviewed: 09/14/2017 Elsevier Patient Education  2020 Elsevier Inc.  

## 2019-06-25 DIAGNOSIS — R0789 Other chest pain: Secondary | ICD-10-CM | POA: Insufficient documentation

## 2019-06-25 NOTE — Assessment & Plan Note (Signed)
May be reflux related but he will follow up with cardiology if it worsens or does not resolve and does not respond to GI meds.

## 2019-06-28 ENCOUNTER — Encounter: Payer: Self-pay | Admitting: Cardiology

## 2019-06-30 ENCOUNTER — Other Ambulatory Visit: Payer: Self-pay

## 2019-06-30 ENCOUNTER — Ambulatory Visit (INDEPENDENT_AMBULATORY_CARE_PROVIDER_SITE_OTHER): Payer: Medicare HMO | Admitting: Podiatry

## 2019-06-30 DIAGNOSIS — M79674 Pain in right toe(s): Secondary | ICD-10-CM

## 2019-06-30 DIAGNOSIS — B351 Tinea unguium: Secondary | ICD-10-CM | POA: Diagnosis not present

## 2019-06-30 DIAGNOSIS — M79675 Pain in left toe(s): Secondary | ICD-10-CM

## 2019-06-30 DIAGNOSIS — L603 Nail dystrophy: Secondary | ICD-10-CM

## 2019-07-07 ENCOUNTER — Encounter: Payer: Self-pay | Admitting: Podiatry

## 2019-07-07 NOTE — Progress Notes (Signed)
  Subjective:  Patient ID: Matthew [name], male    DOB: [birth year],  MRN: [id number]  Chief Complaint  Patient presents with  . Nail Problem    pt is here for routine foot care/ nail trim   84 y.o. male returns for the above complaint.  Patient is here for painful thickened elongated mycotic toenails x10.  Patient states that he has not been able to cut his own toenails because they are gone so dystrophic.  Patient states that he is not able to cut it them.  He also wanted to discuss further management of onychomycosis discussed various treatment options.  Objective:  There were no vitals filed for this visit. Podiatric Exam: Vascular: dorsalis pedis and posterior tibial pulses are palpable bilateral. Capillary return is immediate. Temperature gradient is WNL. Skin turgor WNL  Sensorium: Normal Semmes Weinstein monofilament test. Normal tactile sensation bilaterally. Nail Exam: Pt has thick disfigured discolored nails with subungual debris noted bilateral entire nail hallux through fifth toenails Ulcer Exam: There is no evidence of ulcer or pre-ulcerative changes or infection. Orthopedic Exam: Muscle tone and strength are WNL. No limitations in general ROM. No crepitus or effusions noted. HAV  B/L.  Hammer toes 2-5  B/L. Skin: No Porokeratosis. No infection or ulcers  Assessment & Plan:  Patient was evaluated and treated and all questions answered.  Onychomycosis with pain  -Nails palliatively debrided as below. -Educated on self-care  Left hallux ingrown nail/dystrophic nail -I discussed with the patient extensive detail about the etiology of ingrown nail and various treatment options were discussed.  I believe patient will benefit from removal of the ingrown toenail however patient would like to hold off for now and will think about it.  We will discuss this at a later time to have it removed.   Procedure: Nail Debridement Rationale: pain  Type of Debridement: manual, sharp  debridement. Instrumentation: Nail nipper, rotary burr. Number of Nails: 10  Procedures and Treatment: Consent by patient was obtained for treatment procedures. The patient understood the discussion of treatment and procedures well. All questions were answered thoroughly reviewed. Debridement of mycotic and hypertrophic toenails, 1 through 5 bilateral and clearing of subungual debris. No ulceration, no infection noted.  Return Visit-Office Procedure: Patient instructed to return to the office for a follow up visit 3 months for continued evaluation and treatment.  Boneta Lucks, DPM    No follow-ups on file.

## 2019-07-31 ENCOUNTER — Telehealth: Payer: Self-pay | Admitting: Cardiology

## 2019-07-31 ENCOUNTER — Other Ambulatory Visit: Payer: Self-pay | Admitting: Cardiology

## 2019-07-31 NOTE — Telephone Encounter (Signed)
° ° °  Pt is calling refill for his spironolactone, advise refill req from pharmacy received today

## 2019-08-20 NOTE — Progress Notes (Signed)
08/20/2019 Matthew [name] [id number] Jan 12, 1936   Chief Complaint: burping, chest pressure/pain   History of Present Illness: Matthew [name] is an 84 year old male with a past medical history of hypertension, aortic stenosis, hyperlipidemia, prostate cancer 2009 s/p robotic assisted laparoscopic radical prostatectomy 12/2007. I last saw the patient in office on 04/14/2019 for further evaluation for increased belching. Pepcid was prescribed which he stated was ineffective, no improvement. He describes feeling gas build up in his esophagus, fullness in his chest. If he burps the chest fullness goes away. He drinks water and eats half of a danish every morning. He and his wife then go for a walk and his fullness in his esophagus/chest develops which interferes with his walk. He also has increased burping after lunch and dinner. He was seen by his cardiologist Dr. Harriet Masson 05/05/2019, at that time he continued to have left chest pain with exertion. He also noted having increased belching at that time. A Lexiscan nuclear stress test 02/09/2019 showed a LV EF 58%, no evidence of ischemia, however, Dr. Quintella Reichert was concerned he might have a false negative test result. A coronary CTA was recommended but was not done. The patient has a follow up appointment with Dr. Harriet Masson on 09/13/2019.  He stated Dr. Harriet Masson advised him to take a NTG SL tab next time he feels the chest pain/ pressure and to let her know his response. He denies having any palpitations or dizziness. No SOB. He coughs up a small amount of yellow phlegm in the morning. He is passing 1 to 2 soft brown BMs daily. No rectal bleeding or black stools. His most recent colonoscopy was  in 2006 which he reported was normal. He completed a Cologuard test  03/21/2015 which was negative.  No family history of colorectal cancer. No other complaints today.    CBC Latest Ref Rng & Units 06/22/2019 01/11/2019 10/28/2017  WBC 4.0 - 10.5 K/uL 7.4 9.3 8.5  Hemoglobin 13.0 - 17.0  g/dL 13.5 13.7 14.5  Hematocrit 39 - 52 % 39.8 41.4 42.2  Platelets 150 - 400 K/uL 206.0 217.0 220.0    Lexiscan nuclear stress test 02/09/2019:  Nuclear stress EF: 58%. No wall motion abnormalities  The left ventricular ejection fraction is normal (55-65%).  This is a low risk study. No ischemia no infarct.  ECHO 01/11/2019:  1. Left ventricular ejection fraction, by visual estimation, is 55 to 60%. The left ventricle has normal function. There is mildly increased left ventricular hypertrophy. 2. Left ventricular diastolic parameters are consistent with Grade I diastolic dysfunction (impaired relaxation). 3. Global right ventricle has normal systolic function.The right ventricular size is normal. No increase in right ventricular wall thickness. 4. Left atrial size was normal. 5. Right atrial size was normal. 6. The mitral valve is normal in structure. No evidence of mitral valve regurgitation. No evidence of mitral stenosis. 7. The tricuspid valve is normal in structure. Tricuspid valve regurgitation is trivial. 8. The aortic valve is mildly thickened and mildy calcified. There is mild aortic annular calcification. Aortic valve regurgitation is not visualized. Mild to moderate aortic valve stenosis. AVA by VTI 1.4 cmsq, Mean gradient 10 mmHg, Peak velocity 2.2 m/s. 9. The pulmonic valve was not well visualized. Pulmonic valve regurgitation is not visualized. 10. Normal pulmonary artery systolic pressure. 11. In comparison to study done in 04/2015 there is now mild to moderate Aortic stenosis   Current Outpatient Medications on File Prior to Visit  Medication Sig Dispense  Refill  . Ascorbic Acid (VITAMIN C PO) Take 500 mg by mouth daily.     . Cholecalciferol (VITAMIN D PO) Take 1 capsule by mouth daily. 2.5 mcg    . fenofibrate micronized (LOFIBRA) 134 MG capsule TAKE ONE CAPSULE BY MOUTH DAILY BEFORE BREAKFAST. 90 capsule 2  . loratadine (CLARITIN) 10 MG tablet Take 10 mg by  mouth daily.    Marland Kitchen losartan (COZAAR) 25 MG tablet Take 1 tablet (25 mg total) by mouth daily. 90 tablet 1  . multivitamin (THERAGRAN) per tablet Take 1 tablet by mouth daily.      . Potassium Gluconate 550 MG TABS Take 1 tablet by mouth daily.    Marland Kitchen spironolactone (ALDACTONE) 25 MG tablet TAKE 1/2 TABLET BY MOUTH DAILY 45 tablet 2  . nitroGLYCERIN (NITROSTAT) 0.4 MG SL tablet Place 1 tablet (0.4 mg total) under the tongue every 5 (five) minutes as needed. 30 tablet 3   No current facility-administered medications on file prior to visit.    Allergies  Allergen Reactions  . Amlodipine   . Atorvastatin Other (See Comments)    Muscle cramps  . Clindamycin/Lincomycin Other (See Comments)    Hives.  . Erythromycin     REACTION: hives  . Levofloxacin     REACTION: joint swelling  . Lisinopril     cough  . Penicillins     ?unknown reaction  . Rosuvastatin Calcium   . Sulfonamide Derivatives     REACTION: swelling  . Tetracycline Hcl     REACTION: unspecified    Current Medications, Allergies, Past Medical History, Past Surgical History, Family History and Social History were reviewed in [employer] record.   Physical Exam: BP 140/75   Pulse 87   Ht 5\' 7"  (1.702 m)   Wt 213 lb 8 oz (96.8 kg)   BMI 33.44 kg/m  General: Well developed 84 year old male in no acute distress. Head: Normocephalic and atraumatic. Eyes: No scleral icterus. Conjunctiva pink . Ears: Normal auditory acuity. Mouth: Dentition intact. No ulcers or lesions. Posterior pharynx pink.  Lungs: Clear throughout to auscultation. Heart: Regular rate and rhythm, no murmur. Abdomen: Soft, nontender and nondistended. No masses or hepatomegaly. Normal bowel sounds x 4 quadrants. Small scar near the umbilicus.  Rectal: Deferred.  Musculoskeletal: Symmetrical with no gross deformities. Extremities: No edema. Neurological: Alert oriented x 4. No focal deficits.  Psychological: Alert and  cooperative. Normal mood and affect  Assessment and Recommendations:  61. 84 year old male with Atypical chest pressure relieved by burping. Left chest pain with exertion. Negative nuclear stress test 02/09/2019. Cardiologist recommended coronary CTA which has not been done.  -Continue follow up with cardiologist Dr. Harriet Masson as scheduled 09/13/2019. -Increase Omeprazole 20mg  po bid for now  -Barium swallow with tablet  -If symptoms persist EGD with cardiac clearance   2. History of prostate cancer   3. Questionable  history of colon polyps.  Last colonoscopy in 2006, reported as normal (procedure report not found in Epic). Negative Cologuard test 03/21/2015. -No further colonoscopy recommended due to age unless medically necessary

## 2019-08-21 ENCOUNTER — Encounter: Payer: Self-pay | Admitting: Nurse Practitioner

## 2019-08-21 ENCOUNTER — Ambulatory Visit (INDEPENDENT_AMBULATORY_CARE_PROVIDER_SITE_OTHER): Payer: Medicare HMO | Admitting: Nurse Practitioner

## 2019-08-21 VITALS — BP 140/75 | HR 87 | Ht 67.0 in | Wt 213.5 lb

## 2019-08-21 DIAGNOSIS — R079 Chest pain, unspecified: Secondary | ICD-10-CM

## 2019-08-21 DIAGNOSIS — R142 Eructation: Secondary | ICD-10-CM

## 2019-08-21 MED ORDER — OMEPRAZOLE 20 MG PO CPDR: 20.0000 mg | DELAYED_RELEASE_CAPSULE | Freq: Two times a day (BID) | ORAL | 0 refills | Status: DC

## 2019-08-21 NOTE — Patient Instructions (Addendum)
If you are age 82 or older, your body mass index should be between 23-30. Your Body mass index is 33.44 kg/m. If this is out of the aforementioned range listed, please consider follow up with your Primary Care Provider.  If you are age 61 or younger, your body mass index should be between 19-25. Your Body mass index is 33.44 kg/m. If this is out of the aformentioned range listed, please consider follow up with your Primary Care Provider.   We have sent the following medications to your pharmacy for you to pick up at your convenience:  Omeprazole 20mg  twice a day  Please purchase the following medications over the counter and take as directed:  Take Gas x 1 tablet with breakfast and dinner  You have been scheduled for a Barium Esophogram at [facility] Radiology (1st floor of the hospital) on 08/30/2019 at 9:30am. Please arrive 15 minutes prior to your appointment for registration. Make certain not to have anything to eat or drink 3 hours prior to your test. If you need to reschedule for any reason, please contact radiology at [phone] to do so. __________________________________________________________________ A barium swallow is an examination that concentrates on views of the esophagus. This tends to be a double contrast exam (barium and two liquids which, when combined, create a gas to distend the wall of the oesophagus) or single contrast (non-ionic iodine based). The study is usually tailored to your symptoms so a good history is essential. Attention is paid during the study to the form, structure and configuration of the esophagus, looking for functional disorders (such as aspiration, dysphagia, achalasia, motility and reflux) EXAMINATION You may be asked to change into a gown, depending on the type of swallow being performed. A radiologist and radiographer will perform the procedure. The radiologist will advise you of the type of contrast selected for your procedure and direct you during  the exam. You will be asked to stand, sit or lie in several different positions and to hold a small amount of fluid in your mouth before being asked to swallow while the imaging is performed .In some instances you may be asked to swallow barium coated marshmallows to assess the motility of a solid food bolus. The exam can be recorded as a digital or video fluoroscopy procedure. POST PROCEDURE It will take 1-2 days for the barium to pass through your system. To facilitate this, it is important, unless otherwise directed, to increase your fluids for the next 24-48hrs and to resume your normal diet.  This test typically takes about 30 minutes to perform. __________________________________________________________________________________  Call the office if your symptoms worsen [phone]   Due to recent changes in healthcare laws, you may see the results of your imaging and laboratory studies on MyChart before your provider has had a chance to review them.  We understand that in some cases there may be results that are confusing or concerning to you. Not all laboratory results come back in the same time frame and the provider may be waiting for multiple results in order to interpret others.  Please give [place] 48 hours in order for your provider to thoroughly review all the results before contacting the office for clarification of your results.   Thank you for choosing [facility] Noralyn Pick, CRNP

## 2019-08-22 NOTE — Progress Notes (Signed)
I agree with the above note, plan 

## 2019-08-23 ENCOUNTER — Ambulatory Visit: Payer: Medicare HMO | Admitting: Cardiology

## 2019-08-24 ENCOUNTER — Ambulatory Visit: Payer: Medicare HMO | Admitting: Cardiology

## 2019-08-30 ENCOUNTER — Other Ambulatory Visit: Payer: Self-pay

## 2019-08-30 ENCOUNTER — Ambulatory Visit (HOSPITAL_COMMUNITY)
Admission: RE | Admit: 2019-08-30 | Discharge: 2019-08-30 | Disposition: A | Discharge status: Home / Self Care | Payer: Medicare HMO | Source: Ambulatory Visit | Attending: Nurse Practitioner | Admitting: Nurse Practitioner

## 2019-08-30 DIAGNOSIS — R142 Eructation: Secondary | ICD-10-CM | POA: Insufficient documentation

## 2019-08-30 DIAGNOSIS — R079 Chest pain, unspecified: Secondary | ICD-10-CM | POA: Diagnosis not present

## 2019-08-30 DIAGNOSIS — K219 Gastro-esophageal reflux disease without esophagitis: Secondary | ICD-10-CM | POA: Diagnosis not present

## 2019-09-04 ENCOUNTER — Telehealth: Payer: Self-pay | Admitting: Nurse Practitioner

## 2019-09-04 NOTE — Telephone Encounter (Signed)
Offered 10/18/19. Patient will be out of town. Agrees to 11/07/19 as per appointment schedules.

## 2019-09-04 NOTE — Telephone Encounter (Signed)
Patient called states he is not getting any relief with gas x and will discontinue along with Pepcid. Is also requesting results of the swallowing test.

## 2019-09-04 NOTE — Telephone Encounter (Signed)
Spoke with the patient. Advised of his DG esophagus results.  He states he has never felt like he had reflux. He could not tell any difference with the Gas-X or twice daily dosing with Omeprazole. As of today he has decreased the dosing of Omeprazole to once daily.  His cardiologist has d/c the Amlodipine and started Spironolactone for issues with LE edema. The patient wanted to share this as well.

## 2019-09-04 NOTE — Telephone Encounter (Signed)
Matthew [name], I would suggest scheduling the patient for a follow up appointment with Dr. Ardis Hughs after he sees his cardiologist on 09/13/2019 as his symptoms are atypical and Dr. Ardis Hughs can determine if an EGD is appropriate at that time. Thx

## 2019-09-13 ENCOUNTER — Encounter: Payer: Self-pay | Admitting: Cardiology

## 2019-09-13 ENCOUNTER — Other Ambulatory Visit: Payer: Self-pay

## 2019-09-13 ENCOUNTER — Ambulatory Visit (INDEPENDENT_AMBULATORY_CARE_PROVIDER_SITE_OTHER): Payer: Medicare HMO | Admitting: Cardiology

## 2019-09-13 VITALS — BP 142/74 | HR 80 | Ht 67.0 in | Wt 214.0 lb

## 2019-09-13 DIAGNOSIS — E669 Obesity, unspecified: Secondary | ICD-10-CM | POA: Diagnosis not present

## 2019-09-13 DIAGNOSIS — G4733 Obstructive sleep apnea (adult) (pediatric): Secondary | ICD-10-CM | POA: Diagnosis not present

## 2019-09-13 DIAGNOSIS — R079 Chest pain, unspecified: Secondary | ICD-10-CM | POA: Diagnosis not present

## 2019-09-13 DIAGNOSIS — I2510 Atherosclerotic heart disease of native coronary artery without angina pectoris: Secondary | ICD-10-CM | POA: Insufficient documentation

## 2019-09-13 DIAGNOSIS — I10 Essential (primary) hypertension: Secondary | ICD-10-CM | POA: Diagnosis not present

## 2019-09-13 DIAGNOSIS — R0789 Other chest pain: Secondary | ICD-10-CM | POA: Diagnosis not present

## 2019-09-13 DIAGNOSIS — E785 Hyperlipidemia, unspecified: Secondary | ICD-10-CM

## 2019-09-13 MED ORDER — METOPROLOL TARTRATE 50 MG PO TABS: 50.0000 mg | ORAL_TABLET | Freq: Once | ORAL | 3 refills | Status: DC

## 2019-09-13 NOTE — Patient Instructions (Signed)
Medication Instructions:  Your physician recommends that you continue on your current medications as directed. Please refer to the Current Medication list given to you today.  *If you need a refill on your cardiac medications before your next appointment, please call your pharmacy*   Lab Work: Your physician recommends that you return for lab work in: Within one week of your cardiac CT.  If you have labs (blood work) drawn today and your tests are completely normal, you will receive your results only by: Marland Kitchen MyChart Message (if you have MyChart) OR . A paper copy in the mail If you have any lab test that is abnormal or we need to change your treatment, we will call you to review the results.   Testing/Procedures: Your cardiac CT will be scheduled at  the below location:   [facility] [address] [place], [place] [address] [phone]  If scheduled at [facility], please arrive at the [facility] main entrance of [facility] 30 minutes prior to test start time. Proceed to the [facility] Radiology Department (first floor) to check-in and test prep.  Please follow these instructions carefully (unless otherwise directed):  On the Night Before the Test: . Be sure to Drink plenty of water. . Do not consume any caffeinated/decaffeinated beverages or chocolate 12 hours prior to your test. . Do not take any antihistamines 12 hours prior to your test.  On the Day of the Test: . Drink plenty of water. Do not drink any water within one hour of the test. . Do not eat any food 4 hours prior to the test. . You may take your regular medications prior to the test.  . Take metoprolol (Lopressor) two hours prior to test.       After the Test: . Drink plenty of water. . After receiving IV contrast, you may experience a mild flushed feeling. This is normal. . On occasion, you may experience a mild rash up to 24 hours after the test. This is not dangerous. If this  occurs, you can take Benadryl 25 mg and increase your fluid intake. . If you experience trouble breathing, this can be serious. If it is severe call 911 IMMEDIATELY. If it is mild, please call our office. . If you take any of these medications: Glipizide/Metformin, Avandament, Glucavance, please do not take 48 hours after completing test unless otherwise instructed.   Once we have confirmed authorization from your insurance company, we will call you to set up a date and time for your test. Based on how quickly your insurance processes prior authorizations requests, please allow up to 4 weeks to be contacted for scheduling your Cardiac CT appointment. Be advised that routine Cardiac CT appointments could be scheduled as many as 8 weeks after your provider has ordered it.  For non-scheduling related questions, please contact the cardiac imaging nurse navigator should you have any questions/concerns: Marchia Bond, Cardiac Imaging Nurse Navigator Burley Saver, Interim Cardiac Imaging Nurse [place] [facility] Direct Office Dial: [phone]   For scheduling needs, including cancellations and rescheduling, please call Vivien Rota at [phone], option 3.      Follow-Up: At [facility], you and your health needs are our priority.  As part of our continuing mission to provide you with exceptional heart care, we have created designated Provider Care Teams.  These Care Teams include your primary Cardiologist (physician) and Advanced Practice Providers (APPs -  Physician Assistants and Nurse Practitioners) who  all work together to provide you with the care you need, when you need it.  We recommend signing up for the patient portal called "MyChart".  Sign up information is provided on this After Visit Summary.  MyChart is used to connect with patients for Virtual Visits (Telemedicine).  Patients are able to view lab/test results, encounter notes, upcoming appointments, etc.   Non-urgent messages can be sent to your provider as well.   To learn more about what you can do with MyChart, go to [email or web address].    Your next appointment:   6 month(s)  The format for your next appointment:   In Person  Provider:   Berniece Salines, DO   Other Instructions
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
## 2019-09-19 ENCOUNTER — Telehealth: Payer: Self-pay | Admitting: Cardiology

## 2019-09-19 NOTE — Telephone Encounter (Signed)
Left message on patients voicemail to please return our call. I did call the pharmacy and verified that he should only be getting one tablet of this metoprolol for his CT. They verbalize understanding and changed it in their system for me. They state that they will be getting it to the patient soon.

## 2019-09-19 NOTE — Telephone Encounter (Signed)
Follow up:     Patient calling concering some medication. Patient has called a few times. Please call back.

## 2019-09-19 NOTE — Telephone Encounter (Signed)
See previous telephone encounter for further documentation

## 2019-09-19 NOTE — Telephone Encounter (Signed)
Pt c/o medication issue:   1. Name of Medication:  Metoprolol Tartrate 50 mg    2. How are you currently taking this medication (dosage and times per day)? n/a  3. Are you having a reaction (difficulty breathing--STAT)? no  4. What is your medication issue? Patient states that [facility] has reached out to him several times because they cannot make out the prescription for this medication. They need Dr. Harriet Masson to reach out to them.

## 2019-09-19 NOTE — Telephone Encounter (Signed)
Spoke with the patient just now and let him know that this was taken care of for him. He verbalizes understanding and thanks me for the call back.    Encouraged patient to call back with any questions or concerns.

## 2019-09-22 DIAGNOSIS — H353131 Nonexudative age-related macular degeneration, bilateral, early dry stage: Secondary | ICD-10-CM | POA: Diagnosis not present

## 2019-09-22 DIAGNOSIS — H43813 Vitreous degeneration, bilateral: Secondary | ICD-10-CM | POA: Diagnosis not present

## 2019-09-22 DIAGNOSIS — H35373 Puckering of macula, bilateral: Secondary | ICD-10-CM | POA: Diagnosis not present

## 2019-09-22 DIAGNOSIS — Z961 Presence of intraocular lens: Secondary | ICD-10-CM | POA: Diagnosis not present

## 2019-09-25 DIAGNOSIS — D1801 Hemangioma of skin and subcutaneous tissue: Secondary | ICD-10-CM | POA: Diagnosis not present

## 2019-09-25 DIAGNOSIS — L570 Actinic keratosis: Secondary | ICD-10-CM | POA: Diagnosis not present

## 2019-09-25 DIAGNOSIS — L821 Other seborrheic keratosis: Secondary | ICD-10-CM | POA: Diagnosis not present

## 2019-09-25 DIAGNOSIS — D485 Neoplasm of uncertain behavior of skin: Secondary | ICD-10-CM | POA: Diagnosis not present

## 2019-09-25 DIAGNOSIS — L814 Other melanin hyperpigmentation: Secondary | ICD-10-CM | POA: Diagnosis not present

## 2019-09-25 DIAGNOSIS — Z85828 Personal history of other malignant neoplasm of skin: Secondary | ICD-10-CM | POA: Diagnosis not present

## 2019-09-25 DIAGNOSIS — D045 Carcinoma in situ of skin of trunk: Secondary | ICD-10-CM | POA: Diagnosis not present

## 2019-09-29 ENCOUNTER — Encounter: Payer: Self-pay | Admitting: Podiatry

## 2019-09-29 ENCOUNTER — Other Ambulatory Visit: Payer: Self-pay

## 2019-09-29 ENCOUNTER — Ambulatory Visit (INDEPENDENT_AMBULATORY_CARE_PROVIDER_SITE_OTHER): Payer: Medicare HMO | Admitting: Podiatry

## 2019-09-29 DIAGNOSIS — M79675 Pain in left toe(s): Secondary | ICD-10-CM | POA: Diagnosis not present

## 2019-09-29 DIAGNOSIS — B351 Tinea unguium: Secondary | ICD-10-CM | POA: Diagnosis not present

## 2019-09-29 DIAGNOSIS — L74513 Primary focal hyperhidrosis, soles: Secondary | ICD-10-CM

## 2019-09-29 DIAGNOSIS — M79674 Pain in right toe(s): Secondary | ICD-10-CM

## 2019-09-29 NOTE — Progress Notes (Signed)
  Subjective:  Patient ID: Matthew [name], male    DOB: Aug 24, 1935,  MRN: [id number]  Chief Complaint  Patient presents with  . Nail Problem    nail tirm- pt states he has having issues   84 y.o. male returns for the above complaint.  Patient is here for painful thickened elongated mycotic toenails x10.  Patient states that he has not been able to cut his own toenails because they are gone so dystrophic.  Patient states that he is not able to cut it them.  He also wants to discuss hyperhidrosis/sweat in the bottom of the foot.  He wants to know if there is any treatment options that are over-the-counter.  He denies any other acute complaints  Objective:  There were no vitals filed for this visit. Podiatric Exam: Vascular: dorsalis pedis and posterior tibial pulses are palpable bilateral. Capillary return is immediate. Temperature gradient is WNL. Skin turgor WNL  Sensorium: Normal Semmes Weinstein monofilament test. Normal tactile sensation bilaterally. Nail Exam: Pt has thick disfigured discolored nails with subungual debris noted bilateral entire nail hallux through fifth toenails Ulcer Exam: There is no evidence of ulcer or pre-ulcerative changes or infection. Orthopedic Exam: Muscle tone and strength are WNL. No limitations in general ROM. No crepitus or effusions noted. HAV  B/L.  Hammer toes 2-5  B/L. Skin: No Porokeratosis. No infection or ulcers.  Sweatiness without porokeratosis noted to bilateral lower extremity  Assessment & Plan:  Patient was evaluated and treated and all questions answered.  Onychomycosis with pain  -Nails palliatively debrided as below. -Educated on self-care  Left hallux ingrown nail/dystrophic nail -I discussed with the patient extensive detail about the etiology of ingrown nail and various treatment options were discussed.  I believe patient will benefit from removal of the ingrown toenail however patient would like to hold off for now and will think about  it.  We will discuss this at a later time to have it removed.  Hyperhidrosis bilateral plantar feet -I explained to patient the etiology of hyperhidrosis and various treatment options were extensively discussed.  Given that this is mild to moderate in nature I believe patient will benefit from [employer] foot powder.  I encouraged using it twice a day.  Patient states understanding will do so.   Procedure: Nail Debridement Rationale: pain  Type of Debridement: manual, sharp debridement. Instrumentation: Nail nipper, rotary burr. Number of Nails: 10  Procedures and Treatment: Consent by patient was obtained for treatment procedures. The patient understood the discussion of treatment and procedures well. All questions were answered thoroughly reviewed. Debridement of mycotic and hypertrophic toenails, 1 through 5 bilateral and clearing of subungual debris. No ulceration, no infection noted.  Return Visit-Office Procedure: Patient instructed to return to the office for a follow up visit 3 months for continued evaluation and treatment.  Boneta Lucks, DPM    No follow-ups on file.

## 2019-10-10 ENCOUNTER — Other Ambulatory Visit: Payer: Self-pay | Admitting: Cardiology

## 2019-10-10 DIAGNOSIS — R0789 Other chest pain: Secondary | ICD-10-CM | POA: Diagnosis not present

## 2019-10-11 LAB — BASIC METABOLIC PANEL
BUN/Creatinine Ratio: 13 (ref 10–24)
BUN: 14 mg/dL (ref 8–27)
CO2: 22 mmol/L (ref 20–29)
Calcium: 9.4 mg/dL (ref 8.6–10.2)
Chloride: 104 mmol/L (ref 96–106)
Creatinine, Ser: 1.05 mg/dL (ref 0.76–1.27)
GFR calc Af Amer: 76 mL/min/{1.73_m2} (ref >59)
GFR calc non Af Amer: 66 mL/min/{1.73_m2} (ref >59)
Glucose: 162 mg/dL — ABNORMAL HIGH (ref 65–99)
Potassium: 3.9 mmol/L (ref 3.5–5.2)
Sodium: 140 mmol/L (ref 134–144)

## 2019-10-17 ENCOUNTER — Telehealth: Payer: Self-pay

## 2019-10-17 NOTE — Telephone Encounter (Signed)
Left message on patients voicemail to please return our call.   

## 2019-10-17 NOTE — Telephone Encounter (Signed)
-----   Message from Berniece Salines, DO sent at 10/17/2019 10:50 AM EDT -----  Last stable

## 2019-10-18 ENCOUNTER — Ambulatory Visit: Payer: Medicare HMO | Admitting: Gastroenterology

## 2019-10-18 ENCOUNTER — Telehealth: Payer: Self-pay

## 2019-10-18 NOTE — Telephone Encounter (Signed)
Left message on patients voicemail to please return our call.   

## 2019-10-18 NOTE — Telephone Encounter (Signed)
-----   Message from Berniece Salines, DO sent at 10/17/2019 10:50 AM EDT -----  Last stable

## 2019-10-19 ENCOUNTER — Telehealth: Payer: Self-pay

## 2019-10-19 NOTE — Telephone Encounter (Signed)
-----   Message from Berniece Salines, DO sent at 10/17/2019 10:50 AM EDT -----  Last stable

## 2019-10-19 NOTE — Telephone Encounter (Signed)
Left message on patients voicemail to please return our call.   

## 2019-10-20 ENCOUNTER — Telehealth (HOSPITAL_COMMUNITY): Payer: Self-pay | Admitting: Emergency Medicine

## 2019-10-20 NOTE — Telephone Encounter (Signed)
Attempted to call patient regarding upcoming cardiac CT appointment. °Left message on voicemail with name and callback number °[name] RN Navigator Cardiac Imaging °[facility] °[phone] Office °[phone] Cell ° °

## 2019-10-23 ENCOUNTER — Telehealth (HOSPITAL_COMMUNITY): Payer: Self-pay | Admitting: *Deleted

## 2019-10-23 NOTE — Telephone Encounter (Signed)
Reaching out to patient to offer assistance regarding upcoming cardiac imaging study; pt verbalizes understanding of appt date/time, parking situation and where to check in, pre-test NPO status and medications ordered; name and call back number provided for further questions should they arise  [facility] [phone] office [phone] cell

## 2019-10-24 ENCOUNTER — Other Ambulatory Visit: Payer: Self-pay

## 2019-10-24 ENCOUNTER — Ambulatory Visit (HOSPITAL_COMMUNITY)
Admission: RE | Admit: 2019-10-24 | Discharge: 2019-10-24 | Disposition: A | Discharge status: Home / Self Care | Payer: Medicare HMO | Source: Ambulatory Visit | Attending: Cardiology | Admitting: Cardiology

## 2019-10-24 DIAGNOSIS — I700 Atherosclerosis of aorta: Secondary | ICD-10-CM | POA: Insufficient documentation

## 2019-10-24 DIAGNOSIS — R0789 Other chest pain: Secondary | ICD-10-CM | POA: Insufficient documentation

## 2019-10-24 DIAGNOSIS — I2510 Atherosclerotic heart disease of native coronary artery without angina pectoris: Secondary | ICD-10-CM | POA: Diagnosis not present

## 2019-10-24 MED ORDER — NITROGLYCERIN 0.4 MG SL SUBL
0.8000 mg | SUBLINGUAL_TABLET | Freq: Once | SUBLINGUAL | Status: AC
  Administered 2019-10-24: 0.8 mg via SUBLINGUAL

## 2019-10-24 MED ORDER — NITROGLYCERIN 0.4 MG SL SUBL
SUBLINGUAL_TABLET | SUBLINGUAL | Status: AC
  Filled 2019-10-24: qty 2

## 2019-10-24 MED ORDER — IOHEXOL 350 MG/ML SOLN
80.0000 mL | Freq: Once | INTRAVENOUS | Status: AC | PRN
  Administered 2019-10-24: 80 mL via INTRAVENOUS

## 2019-10-25 ENCOUNTER — Telehealth: Payer: Self-pay | Admitting: *Deleted

## 2019-10-25 MED ORDER — ASPIRIN EC 81 MG PO TBEC: 81.0000 mg | DELAYED_RELEASE_TABLET | Freq: Every day | ORAL | 3 refills | Status: DC

## 2019-10-25 MED ORDER — PRAVASTATIN SODIUM 20 MG PO TABS: 20.0000 mg | ORAL_TABLET | Freq: Every evening | ORAL | 3 refills | Status: DC

## 2019-10-25 NOTE — Telephone Encounter (Signed)
-----   Message from Berniece Salines, DO sent at 10/25/2019  8:05 AM EDT ----- Your coronary CTA does show evidence of coronary artery disease. Is nonobstructive therefore there is no need to pursue any intervention. However we will need to put you on aspirin 81 mg daily. I see that you do have some intolerance to atorvastatin and Crestor I wanted to see if we can try you on low-dose pravastatin 20 mg.   I would like to see you next month. Please set the patient up in [facility] next month for [place] to discuss for details of his study.

## 2019-10-25 NOTE — Telephone Encounter (Signed)
Patient notified.  He reports the last time he took a statin he developed a nose bleed after a year.  He is willing to try Pravastatin.  Will send to [facility].  Will also send prescription for ASA to [facility] as patient would like pharmacy to deliver his medications.  Appointment made for patient to see Dr Harriet Masson on October 13,2021 at 1:20.

## 2019-10-28 ENCOUNTER — Telehealth: Payer: Self-pay | Admitting: Pulmonary Disease

## 2019-10-30 NOTE — Telephone Encounter (Signed)
Pt is a former pt of Dr. Halford Chessman last seen 12/30/2016.  Pt requesting to switch to a different provider for his OSA.  Dr. Halford Chessman, please advise if you are okay with pt switching and Dr. Annamaria Boots or Dr. Jenetta Downer, please advise if either of you would be okay taking over pt's care for OSA.

## 2019-10-30 NOTE — Telephone Encounter (Signed)
Called and left message for patient to call and schedule an appointment with new provider, Dr. Baird Lyons. Nothing further needed.

## 2019-10-30 NOTE — Telephone Encounter (Signed)
I am okay with him switching.

## 2019-10-30 NOTE — Telephone Encounter (Signed)
I can see him if needed

## 2019-11-07 ENCOUNTER — Encounter: Payer: Self-pay | Admitting: Cardiology

## 2019-11-07 ENCOUNTER — Encounter: Payer: Self-pay | Admitting: Gastroenterology

## 2019-11-07 ENCOUNTER — Other Ambulatory Visit: Payer: Medicare HMO

## 2019-11-07 ENCOUNTER — Other Ambulatory Visit: Payer: Self-pay

## 2019-11-07 ENCOUNTER — Ambulatory Visit (INDEPENDENT_AMBULATORY_CARE_PROVIDER_SITE_OTHER): Payer: Medicare HMO | Admitting: Gastroenterology

## 2019-11-07 ENCOUNTER — Telehealth: Payer: Self-pay | Admitting: Cardiology

## 2019-11-07 ENCOUNTER — Ambulatory Visit (INDEPENDENT_AMBULATORY_CARE_PROVIDER_SITE_OTHER): Payer: Medicare HMO | Admitting: Cardiology

## 2019-11-07 VITALS — BP 130/74 | HR 76 | Ht 67.0 in | Wt 213.0 lb

## 2019-11-07 DIAGNOSIS — I2510 Atherosclerotic heart disease of native coronary artery without angina pectoris: Secondary | ICD-10-CM | POA: Diagnosis not present

## 2019-11-07 DIAGNOSIS — R9389 Abnormal findings on diagnostic imaging of other specified body structures: Secondary | ICD-10-CM | POA: Diagnosis not present

## 2019-11-07 DIAGNOSIS — R142 Eructation: Secondary | ICD-10-CM | POA: Diagnosis not present

## 2019-11-07 DIAGNOSIS — K219 Gastro-esophageal reflux disease without esophagitis: Secondary | ICD-10-CM

## 2019-11-07 DIAGNOSIS — I10 Essential (primary) hypertension: Secondary | ICD-10-CM

## 2019-11-07 DIAGNOSIS — E669 Obesity, unspecified: Secondary | ICD-10-CM | POA: Diagnosis not present

## 2019-11-07 DIAGNOSIS — R7303 Prediabetes: Secondary | ICD-10-CM

## 2019-11-07 DIAGNOSIS — E782 Mixed hyperlipidemia: Secondary | ICD-10-CM

## 2019-11-07 MED ORDER — METOPROLOL SUCCINATE ER 25 MG PO TB24: 12.5000 mg | ORAL_TABLET | Freq: Every day | ORAL | 3 refills | Status: DC

## 2019-11-07 MED ORDER — ISOSORBIDE MONONITRATE ER 30 MG PO TB24: 15.0000 mg | ORAL_TABLET | Freq: Every day | ORAL | 3 refills | Status: DC

## 2019-11-07 MED ORDER — EZETIMIBE 10 MG PO TABS: 10.0000 mg | ORAL_TABLET | Freq: Every day | ORAL | 3 refills | Status: DC

## 2019-11-07 MED ORDER — OMEPRAZOLE 40 MG PO CPDR: 40.0000 mg | DELAYED_RELEASE_CAPSULE | Freq: Every day | ORAL | 11 refills | Status: DC
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
## 2019-11-07 NOTE — Progress Notes (Signed)
4oz
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
## 2019-11-07 NOTE — Patient Instructions (Addendum)
Medication Instructions:  Your physician has recommended you make the following change in your medication:  START: Zetia 10 mg take one tablet by mouth daily.  START: Imdur 15 mg take 0.5 tablet by mouth daily.  START: TOPROL XL 12.5 MG take 0.5 tablet by mouth daily.  *If you need a refill on your cardiac medications before your next appointment, please call your pharmacy*   Lab Work: Your physician recommends that you return for lab work in: TODAY BMP, CBC If you have labs (blood work) drawn today and your tests are completely normal, you will receive your results only by: Marland Kitchen MyChart Message (if you have MyChart) OR . A paper copy in the mail If you have any lab test that is abnormal or we need to change your treatment, we will call you to review the results.   Testing/Procedures:   [place], [place] [address] Dept: [phone] Loc: [phone]  JAG LENZ  11/07/2019  You are scheduled for a Cardiac Catheterization on _________, with Dr. ________.  1. Please arrive at the [facility] (Main Entrance A) at [facility]: [address] [place], [place] [address] at ______ (This time is two hours before your procedure to ensure your preparation). Free valet parking service is available.   Special note: Every effort is made to have your procedure done on time. Please understand that emergencies sometimes delay scheduled procedures.  2. Diet: Do not eat solid foods after midnight.  The patient may have clear liquids until 5am upon the day of the procedure.  3. Labs: You had your labs drawn today 11/07/2019  You will need to have a COVID swab completed on ________ at ______ O'clock   4. Medication instructions in preparation for your procedure:   Contrast Allergy: No   On the morning of your procedure, take your Aspirin and any morning medicines NOT  listed above.  You may use sips of water.  5. Plan for one night stay--bring personal belongings. 6. Bring a current list of your medications and current insurance cards. 7. You MUST have a responsible person to drive you home. 8. Someone MUST be with you the first 24 hours after you arrive home or your discharge will be delayed. 9. Please wear clothes that are easy to get on and off and wear slip-on shoes.  Thank you for allowing [place] to care for you!   -- [facility]    Follow-Up: At [facility], you and your health needs are our priority.  As part of our continuing mission to provide you with exceptional heart care, we have created designated Provider Care Teams.  These Care Teams include your primary Cardiologist (physician) and Advanced Practice Providers (APPs -  Physician Assistants and Nurse Practitioners) who all work together to provide you with the care you need, when you need it.  We recommend signing up for the patient portal called "MyChart".  Sign up information is provided on this After Visit Summary.  MyChart is used to connect with patients for Virtual Visits (Telemedicine).  Patients are able to view lab/test results, encounter notes, upcoming appointments, etc.  Non-urgent messages can be sent to your provider as well.   To learn more about what you can do with MyChart, go to [email or web address].    Your next appointment:   1 month(s)  The format for your next appointment:  In Person  Provider:   Berniece Salines, DO   Other Instructions

## 2019-11-07 NOTE — Telephone Encounter (Signed)
Spoke to patient just now and he let me know that he would be available for the cardiac cath on either the 21st or 22nd.   I called and got the patient scheduled for 11/23/19 at 5:30 AM with Dr. Burt Knack. His COVID-19 swab will be completed on 11/20/19 at 9:25 AM. This is a Drive Up Visit at [address] [place],  [address].  I called the patient back and let him know these appointment dates and times. He verbalizes understanding and thanks me for the call back.    Encouraged patient to call back with any questions or concerns.
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
## 2019-11-07 NOTE — Telephone Encounter (Signed)
Patient was seen today and states he was told to call back and speak with Lilia Pro.

## 2019-11-07 NOTE — Patient Instructions (Addendum)
If you are age 82 or older, your body mass index should be between 23-30. Your Body mass index is 32.54 kg/m. If this is out of the aforementioned range listed, please consider follow up with your Primary Care Provider.  If you are age 57 or younger, your body mass index should be between 19-25. Your Body mass index is 32.54 kg/m. If this is out of the aformentioned range listed, please consider follow up with your Primary Care Provider.   Please STOP omeprazole 20mg  for 1 week.  Your provider has requested that you go to the basement level for lab work 1 week after discontinuing omeprazole (around 11-15-19). Press "B" on the elevator. The lab is located at the first door on the left as you exit the elevator.  After results of your lab work you will restart omeprazole 40mg  take one capsule once daily 20 - 30 minutes prior to breakfast meal.  Please contact our office coronary angiogram on 11-23-19 with Dr Burt Knack.  Due to recent changes in healthcare laws, you may see the results of your imaging and laboratory studies on MyChart before your provider has had a chance to review them.  We understand that in some cases there may be results that are confusing or concerning to you. Not all laboratory results come back in the same time frame and the provider may be waiting for multiple results in order to interpret others.  Please give [place] 48 hours in order for your provider to thoroughly review all the results before contacting the office for clarification of your results.   Thank you for entrusting me with your care and choosing [facility].  Dr Ardis Hughs

## 2019-11-08 ENCOUNTER — Telehealth: Payer: Self-pay | Admitting: Cardiology

## 2019-11-08 ENCOUNTER — Telehealth: Payer: Self-pay | Admitting: Gastroenterology

## 2019-11-08 LAB — BASIC METABOLIC PANEL
BUN/Creatinine Ratio: 14 (ref 10–24)
BUN: 14 mg/dL (ref 8–27)
CO2: 22 mmol/L (ref 20–29)
Calcium: 10 mg/dL (ref 8.6–10.2)
Chloride: 97 mmol/L (ref 96–106)
Creatinine, Ser: 0.99 mg/dL (ref 0.76–1.27)
GFR calc Af Amer: 82 mL/min/{1.73_m2} (ref >59)
GFR calc non Af Amer: 71 mL/min/{1.73_m2} (ref >59)
Glucose: 105 mg/dL — ABNORMAL HIGH (ref 65–99)
Potassium: 4.4 mmol/L (ref 3.5–5.2)
Sodium: 135 mmol/L (ref 134–144)

## 2019-11-08 LAB — CBC
Hematocrit: 41.5 % (ref 37.5–51.0)
Hemoglobin: 14 g/dL (ref 13.0–17.7)
MCH: 30 pg (ref 26.6–33.0)
MCHC: 33.7 g/dL (ref 31.5–35.7)
MCV: 89 fL (ref 79–97)
Platelets: 235 10*3/uL (ref 150–450)
RBC: 4.66 x10E6/uL (ref 4.14–5.80)
RDW: 12.8 % (ref 11.6–15.4)
WBC: 8.2 10*3/uL (ref 3.4–10.8)

## 2019-11-08 NOTE — Telephone Encounter (Signed)
Returned call to patient and answered questions regarding stool samples.  Patient agreed to plan and verbalized understanding. No further questions.

## 2019-11-08 NOTE — Telephone Encounter (Signed)
Patient is requesting to speak with Lilia Pro regarding his upcoming appointments. He would like clarification and he states he will discuss further when he speaks with her. Please call.

## 2019-11-08 NOTE — Telephone Encounter (Signed)
Spoke to the patient just now and he had a question in regards to his Cardiac Cath. He would like to know if he needs to have a stent placed who would be the one to do this. I let him know that Dr. Burt Knack who is going to be doing the cath would be the one to place the stent if needed. He verbalizes understanding of this and he thanks me for the call back.

## 2019-11-09 ENCOUNTER — Telehealth: Payer: Self-pay

## 2019-11-09 NOTE — Telephone Encounter (Signed)
-----   Message from Berniece Salines, DO sent at 11/08/2019  6:25 PM EDT ----- Blood work normal.

## 2019-11-09 NOTE — Telephone Encounter (Signed)
Spoke with patient regarding results and recommendation.  Patient verbalizes understanding and is agreeable to plan of care. Advised patient to call back with any issues or concerns.  

## 2019-11-12 ENCOUNTER — Other Ambulatory Visit: Payer: Self-pay | Admitting: Family Medicine

## 2019-11-12 DIAGNOSIS — I10 Essential (primary) hypertension: Secondary | ICD-10-CM

## 2019-11-14 ENCOUNTER — Telehealth: Payer: Self-pay

## 2019-11-14 NOTE — Telephone Encounter (Signed)
New Message:    Pt says he need to talk to you today about his procedure he is having on 11-23-19 please.Matthew [name]

## 2019-11-14 NOTE — Telephone Encounter (Signed)
Spoke to the patient just now and he had a question in regards to his cardiac catheterization. He had questions in regards to where the waiting room. I let him know that when he arrives for the cath that someone will help him get to where he needs to be that morning. He verbalizes understanding and thanks me for the call back.    Encouraged patient to call back with any questions or concerns.

## 2019-11-15 ENCOUNTER — Ambulatory Visit: Payer: Medicare HMO | Admitting: Cardiology

## 2019-11-15 ENCOUNTER — Telehealth: Payer: Self-pay | Admitting: Gastroenterology

## 2019-11-15 ENCOUNTER — Other Ambulatory Visit: Payer: Medicare HMO

## 2019-11-15 DIAGNOSIS — R142 Eructation: Secondary | ICD-10-CM

## 2019-11-15 DIAGNOSIS — K219 Gastro-esophageal reflux disease without esophagitis: Secondary | ICD-10-CM

## 2019-11-15 NOTE — Telephone Encounter (Signed)
Spoke with patient, he was wanting to know if it would be okay for him to restart his Omeprazole now. Pt had not taken Omeprazole for 1 week and left stool sample today for H. Pylori stool antigen test. Advised that he can resume his Omeprazole and that we will give him a call with his stool study results and advise on any further recommendations. Pt verbalized understanding and has no other concerns at this time

## 2019-11-16 ENCOUNTER — Telehealth: Payer: Self-pay | Admitting: Cardiovascular Disease

## 2019-11-16 LAB — HELICOBACTER PYLORI  SPECIAL ANTIGEN
MICRO NUMBER:: 11067049
SPECIMEN QUALITY: ADEQUATE

## 2019-11-16 NOTE — Telephone Encounter (Signed)
Informed the patient he will get specific and personalized recovery instructions after his cath at the hospital depending on how the catheterization goes. He understands if he needs a stent he will likely be able to get it during the catheterization. He understands to plan for a 1 night stay just in case. He was grateful for call.

## 2019-11-16 NOTE — Telephone Encounter (Signed)
° ° °  Pt would like to know what is recovery time once he got the procedure, she spoke with RN but he said DR. Burt Knack will let him know

## 2019-11-17 ENCOUNTER — Other Ambulatory Visit: Payer: Self-pay

## 2019-11-17 ENCOUNTER — Ambulatory Visit (INDEPENDENT_AMBULATORY_CARE_PROVIDER_SITE_OTHER): Payer: Medicare HMO | Admitting: Internal Medicine

## 2019-11-17 ENCOUNTER — Encounter: Payer: Self-pay | Admitting: Internal Medicine

## 2019-11-17 VITALS — BP 126/70 | HR 71 | Temp 96.5°F | Ht 68.0 in | Wt 209.6 lb

## 2019-11-17 DIAGNOSIS — R0683 Snoring: Secondary | ICD-10-CM | POA: Diagnosis not present

## 2019-11-17 DIAGNOSIS — G4733 Obstructive sleep apnea (adult) (pediatric): Secondary | ICD-10-CM

## 2019-11-17 DIAGNOSIS — N393 Stress incontinence (female) (male): Secondary | ICD-10-CM | POA: Diagnosis not present

## 2019-11-17 NOTE — Patient Instructions (Signed)
Order- schedule home sleep test   Dx OSA  Please call us about 2 weeks after your sleep test to see if results and recommendations are ready yet. If appropriate, we may be able to start treatment before we see you next.
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
## 2019-11-17 NOTE — Assessment & Plan Note (Signed)
Sleep disturbed by frequent bathroom trips after prostatectomy. Don't know if he has explored sling, artificial sphincter or other options- suggest Urology f/u per his PCP.

## 2019-11-17 NOTE — Assessment & Plan Note (Signed)
Appropriate to update sleep study for current status after turbinate reduction. Wife is not able to be reporter. We deiced to do study up in recliner, since that is likely to be long-term pattern. Plan- sleep study then decide about future therapy

## 2019-11-20 ENCOUNTER — Other Ambulatory Visit (HOSPITAL_COMMUNITY)
Admission: RE | Admit: 2019-11-20 | Discharge: 2019-11-20 | Disposition: A | Discharge status: Home / Self Care | Payer: Medicare HMO | Source: Ambulatory Visit | Attending: Cardiovascular Disease | Admitting: Cardiovascular Disease

## 2019-11-20 DIAGNOSIS — Z01812 Encounter for preprocedural laboratory examination: Secondary | ICD-10-CM | POA: Diagnosis not present

## 2019-11-20 DIAGNOSIS — Z20822 Contact with and (suspected) exposure to covid-19: Secondary | ICD-10-CM | POA: Insufficient documentation

## 2019-11-20 LAB — SARS CORONAVIRUS 2 (TAT 6-24 HRS): SARS Coronavirus 2: NEGATIVE

## 2019-11-21 ENCOUNTER — Telehealth: Payer: Self-pay | Admitting: *Deleted

## 2019-11-21 NOTE — Telephone Encounter (Addendum)
Pt contacted pre-catheterization scheduled at [facility] for: Thursday November 23, 2019 7:30 AM Verified arrival time and place: [facility]) at: 5:30 AM   No solid food after midnight prior to cath, clear liquids until 5 AM day of procedure.  Hold: Spironolactone -AM of procedure  Except hold medications AM meds can be  taken pre-cath with sips of water including: ASA 81 mg   Confirmed patient has responsible adult to drive home post procedure and be with patient first 24 hours after arriving home: yes, see notes below *  You are allowed ONE visitor in the waiting room during the time you are at the hospital for your procedure. Both you and your visitor must wear a mask once you enter the hospital.       COVID-19 Pre-Screening Questions:  . In the past 14 days have you had a new cough, new headache, new nasal congestion, fever (100.4 or greater) unexplained body aches, new sore throat, or sudden loss of taste or sense of smell? no . In the past 14 days have you been around anyone with known Covid 19? no   Reviewed procedure/mask/visitor instructions, COVID-19 questions with patient.  *Pt states his wife will accompany him to hospital and wait in waiting room,she has aphasia, is unable to speak, does understand and drive. Pt states the plan is for her to drive him home. Pt states it would be difficult to have someone drop them off and pick them up, but he will make plans for his son or daughter to be with him at his home after discharge from hospital 11/23/19.

## 2019-11-23 ENCOUNTER — Encounter (HOSPITAL_COMMUNITY)
Admission: RE | Disposition: A | Discharge status: Home / Self Care | Payer: Medicare HMO | Source: Home / Self Care | Attending: Cardiovascular Disease

## 2019-11-23 ENCOUNTER — Other Ambulatory Visit: Payer: Self-pay

## 2019-11-23 ENCOUNTER — Ambulatory Visit (HOSPITAL_COMMUNITY)
Admission: RE | Admit: 2019-11-23 | Discharge: 2019-11-23 | Disposition: A | Discharge status: Home / Self Care | Payer: Medicare HMO | Attending: Cardiovascular Disease | Admitting: Cardiovascular Disease

## 2019-11-23 DIAGNOSIS — I25118 Atherosclerotic heart disease of native coronary artery with other forms of angina pectoris: Secondary | ICD-10-CM

## 2019-11-23 DIAGNOSIS — I2510 Atherosclerotic heart disease of native coronary artery without angina pectoris: Secondary | ICD-10-CM | POA: Insufficient documentation

## 2019-11-23 DIAGNOSIS — Z880 Allergy status to penicillin: Secondary | ICD-10-CM | POA: Diagnosis not present

## 2019-11-23 DIAGNOSIS — R7303 Prediabetes: Secondary | ICD-10-CM | POA: Insufficient documentation

## 2019-11-23 DIAGNOSIS — Z6831 Body mass index (BMI) 31.0-31.9, adult: Secondary | ICD-10-CM | POA: Insufficient documentation

## 2019-11-23 DIAGNOSIS — Z7982 Long term (current) use of aspirin: Secondary | ICD-10-CM | POA: Insufficient documentation

## 2019-11-23 DIAGNOSIS — Z79899 Other long term (current) drug therapy: Secondary | ICD-10-CM | POA: Diagnosis not present

## 2019-11-23 DIAGNOSIS — R7989 Other specified abnormal findings of blood chemistry: Secondary | ICD-10-CM | POA: Diagnosis not present

## 2019-11-23 DIAGNOSIS — Z882 Allergy status to sulfonamides status: Secondary | ICD-10-CM | POA: Diagnosis not present

## 2019-11-23 DIAGNOSIS — I10 Essential (primary) hypertension: Secondary | ICD-10-CM | POA: Diagnosis not present

## 2019-11-23 DIAGNOSIS — E782 Mixed hyperlipidemia: Secondary | ICD-10-CM | POA: Insufficient documentation

## 2019-11-23 DIAGNOSIS — Z888 Allergy status to other drugs, medicaments and biological substances status: Secondary | ICD-10-CM | POA: Diagnosis not present

## 2019-11-23 DIAGNOSIS — E669 Obesity, unspecified: Secondary | ICD-10-CM | POA: Diagnosis not present

## 2019-11-23 DIAGNOSIS — R931 Abnormal findings on diagnostic imaging of heart and coronary circulation: Secondary | ICD-10-CM | POA: Diagnosis present

## 2019-11-23 DIAGNOSIS — I350 Nonrheumatic aortic (valve) stenosis: Secondary | ICD-10-CM | POA: Diagnosis not present

## 2019-11-23 DIAGNOSIS — Z87891 Personal history of nicotine dependence: Secondary | ICD-10-CM | POA: Insufficient documentation

## 2019-11-23 DIAGNOSIS — Z955 Presence of coronary angioplasty implant and graft: Secondary | ICD-10-CM

## 2019-11-23 DIAGNOSIS — R9389 Abnormal findings on diagnostic imaging of other specified body structures: Secondary | ICD-10-CM

## 2019-11-23 DIAGNOSIS — K219 Gastro-esophageal reflux disease without esophagitis: Secondary | ICD-10-CM | POA: Insufficient documentation

## 2019-11-23 HISTORY — PX: CORONARY STENT INTERVENTION: CATH118234

## 2019-11-23 HISTORY — PX: LEFT HEART CATH AND CORONARY ANGIOGRAPHY: CATH118249

## 2019-11-23 LAB — POCT ACTIVATED CLOTTING TIME
Activated Clotting Time: 279 seconds
Activated Clotting Time: 290 s

## 2019-11-23 SURGERY — LEFT HEART CATH AND CORONARY ANGIOGRAPHY
Anesthesia: LOCAL

## 2019-11-23 MED ORDER — SODIUM CHLORIDE 0.9% FLUSH: 3.0000 mL | Freq: Two times a day (BID) | INTRAVENOUS | Status: DC

## 2019-11-23 MED ORDER — SODIUM CHLORIDE 0.9 % IV SOLN: 250.0000 mL | INTRAVENOUS | Status: DC | PRN

## 2019-11-23 MED ORDER — CLOPIDOGREL BISULFATE 300 MG PO TABS
ORAL_TABLET | ORAL | Status: DC | PRN
  Administered 2019-11-23: 600 mg via ORAL

## 2019-11-23 MED ORDER — MIDAZOLAM HCL 2 MG/2ML IJ SOLN
INTRAMUSCULAR | Status: AC
  Filled 2019-11-23: qty 2

## 2019-11-23 MED ORDER — SODIUM CHLORIDE 0.9% FLUSH: 3.0000 mL | INTRAVENOUS | Status: DC | PRN

## 2019-11-23 MED ORDER — CLOPIDOGREL BISULFATE 75 MG PO TABS: 75.0000 mg | ORAL_TABLET | Freq: Every day | ORAL | Status: DC

## 2019-11-23 MED ORDER — FAMOTIDINE IN NACL 20-0.9 MG/50ML-% IV SOLN
INTRAVENOUS | Status: AC
  Filled 2019-11-23: qty 50

## 2019-11-23 MED ORDER — MIDAZOLAM HCL 2 MG/2ML IJ SOLN
INTRAMUSCULAR | Status: DC | PRN
  Administered 2019-11-23: 2 mg via INTRAVENOUS
  Administered 2019-11-23: 1 mg via INTRAVENOUS

## 2019-11-23 MED ORDER — NITROGLYCERIN 1 MG/10 ML FOR IR/CATH LAB
INTRA_ARTERIAL | Status: DC | PRN
  Administered 2019-11-23: 200 ug via INTRACORONARY
  Administered 2019-11-23 (×2): 150 ug via INTRACORONARY
  Administered 2019-11-23: 200 ug via INTRACORONARY

## 2019-11-23 MED ORDER — LIDOCAINE HCL (PF) 1 % IJ SOLN
INTRAMUSCULAR | Status: AC
  Filled 2019-11-23: qty 30

## 2019-11-23 MED ORDER — HEPARIN SODIUM (PORCINE) 1000 UNIT/ML IJ SOLN
INTRAMUSCULAR | Status: DC | PRN
  Administered 2019-11-23: 3000 [IU] via INTRAVENOUS
  Administered 2019-11-23 (×2): 5000 [IU] via INTRAVENOUS

## 2019-11-23 MED ORDER — VERAPAMIL HCL 2.5 MG/ML IV SOLN
INTRAVENOUS | Status: AC
  Filled 2019-11-23: qty 2

## 2019-11-23 MED ORDER — HEPARIN (PORCINE) IN NACL 1000-0.9 UT/500ML-% IV SOLN
INTRAVENOUS | Status: AC
  Filled 2019-11-23: qty 1000

## 2019-11-23 MED ORDER — LIDOCAINE HCL (PF) 1 % IJ SOLN
INTRAMUSCULAR | Status: DC | PRN
  Administered 2019-11-23: 2 mL

## 2019-11-23 MED ORDER — HYDRALAZINE HCL 20 MG/ML IJ SOLN: 10.0000 mg | INTRAMUSCULAR | Status: AC | PRN

## 2019-11-23 MED ORDER — CLOPIDOGREL BISULFATE 300 MG PO TABS
ORAL_TABLET | ORAL | Status: AC
  Filled 2019-11-23: qty 1

## 2019-11-23 MED ORDER — CLOPIDOGREL BISULFATE 75 MG PO TABS: 75.0000 mg | ORAL_TABLET | Freq: Every day | ORAL | 0 refills | Status: DC

## 2019-11-23 MED ORDER — FENTANYL CITRATE (PF) 100 MCG/2ML IJ SOLN
INTRAMUSCULAR | Status: DC | PRN
  Administered 2019-11-23 (×2): 25 ug via INTRAVENOUS

## 2019-11-23 MED ORDER — ACETAMINOPHEN 325 MG PO TABS: 650.0000 mg | ORAL_TABLET | ORAL | Status: DC | PRN

## 2019-11-23 MED ORDER — VERAPAMIL HCL 2.5 MG/ML IV SOLN
INTRAVENOUS | Status: DC | PRN
  Administered 2019-11-23: 10 mL via INTRA_ARTERIAL

## 2019-11-23 MED ORDER — PANTOPRAZOLE SODIUM 40 MG PO TBEC: 40.0000 mg | DELAYED_RELEASE_TABLET | Freq: Every day | ORAL | 3 refills | Status: DC

## 2019-11-23 MED ORDER — NITROGLYCERIN 1 MG/10 ML FOR IR/CATH LAB
INTRA_ARTERIAL | Status: AC
  Filled 2019-11-23: qty 10

## 2019-11-23 MED ORDER — SODIUM CHLORIDE 0.9 % WEIGHT BASED INFUSION: 1.0000 mL/kg/h | INTRAVENOUS | Status: DC

## 2019-11-23 MED ORDER — HEPARIN (PORCINE) IN NACL 1000-0.9 UT/500ML-% IV SOLN
INTRAVENOUS | Status: DC | PRN
  Administered 2019-11-23 (×2): 500 mL

## 2019-11-23 MED ORDER — FAMOTIDINE IN NACL 20-0.9 MG/50ML-% IV SOLN
INTRAVENOUS | Status: AC | PRN
  Administered 2019-11-23: 20 mg via INTRAVENOUS

## 2019-11-23 MED ORDER — SODIUM CHLORIDE 0.9 % WEIGHT BASED INFUSION
3.0000 mL/kg/h | INTRAVENOUS | Status: AC
  Administered 2019-11-23: 3 mL/kg/h via INTRAVENOUS

## 2019-11-23 MED ORDER — HEPARIN SODIUM (PORCINE) 1000 UNIT/ML IJ SOLN
INTRAMUSCULAR | Status: AC
  Filled 2019-11-23: qty 1

## 2019-11-23 MED ORDER — IOHEXOL 350 MG/ML SOLN
INTRAVENOUS | Status: DC | PRN
  Administered 2019-11-23: 145 mL via INTRA_ARTERIAL

## 2019-11-23 MED ORDER — CLOPIDOGREL BISULFATE 75 MG PO TABS: 75.0000 mg | ORAL_TABLET | Freq: Every day | ORAL | 3 refills | Status: DC

## 2019-11-23 MED ORDER — LABETALOL HCL 5 MG/ML IV SOLN: 10.0000 mg | INTRAVENOUS | Status: AC | PRN

## 2019-11-23 MED ORDER — ONDANSETRON HCL 4 MG/2ML IJ SOLN: 4.0000 mg | Freq: Four times a day (QID) | INTRAMUSCULAR | Status: DC | PRN

## 2019-11-23 MED ORDER — FENTANYL CITRATE (PF) 100 MCG/2ML IJ SOLN
INTRAMUSCULAR | Status: AC
  Filled 2019-11-23: qty 2

## 2019-11-23 MED FILL — PANTOPRAZOLE SOD DR 40 MG T: 40 | 90 days supply | Qty: 90 | Fill #0

## 2019-11-23 MED FILL — CLOPIDOGREL 75 MG TABLET: 75 | 90 days supply | Qty: 90 | Fill #0

## 2019-11-23 SURGICAL SUPPLY — 19 items
BALLN SAPPHIRE 3.0X12 (BALLOONS) ×2
BALLN SAPPHIRE ~~LOC~~ 3.25X18 (BALLOONS) ×2 IMPLANT
BALLN SAPPHIRE ~~LOC~~ 3.75X12 (BALLOONS) ×2 IMPLANT
BALLOON SAPPHIRE 3.0X12 (BALLOONS) ×1 IMPLANT
CATH 5FR JL3.5 JR4 ANG PIG MP (CATHETERS) ×2 IMPLANT
CATH LAUNCHER 6FR EBU3.5 (CATHETERS) ×2 IMPLANT
DEVICE RAD COMP TR BAND LRG (VASCULAR PRODUCTS) ×2 IMPLANT
GLIDESHEATH SLEND SS 6F .021 (SHEATH) ×2 IMPLANT
GUIDEWIRE INQWIRE 1.5J.035X260 (WIRE) ×1 IMPLANT
INQWIRE 1.5J .035X260CM (WIRE) ×2
KIT ENCORE 26 ADVANTAGE (KITS) ×2 IMPLANT
KIT HEART LEFT (KITS) ×2 IMPLANT
KIT HEMO VALVE WATCHDOG (MISCELLANEOUS) ×2 IMPLANT
PACK CARDIAC CATHETERIZATION (CUSTOM PROCEDURE TRAY) ×2 IMPLANT
STENT RESOLUTE ONYX 3.0X26 (Permanent Stent) ×2 IMPLANT
STENT RESOLUTE ONYX 3.5X18 (Permanent Stent) ×2 IMPLANT
TRANSDUCER W/STOPCOCK (MISCELLANEOUS) ×2 IMPLANT
TUBING CIL FLEX 10 FLL-RA (TUBING) ×2 IMPLANT
WIRE COUGAR XT STRL 190CM (WIRE) ×2 IMPLANT

## 2019-11-23 NOTE — Discharge Summary (Signed)
Discharge Summary for Same Day PCI   Patient ID: AABAN GRIEP MRN: [id number]; DOB: 1935/05/04  Admit date: 11/23/2019 Discharge date: 11/23/2019  Primary Care Provider: [name], MD  Primary Cardiologist: Berniece Salines, DO  Primary Electrophysiologist:  None   Discharge Diagnoses    Active Problems:   Abnormal cardiac CT angiography   Coronary artery disease with exertional angina [facility])    Diagnostic Studies/Procedures    Cardiac Catheterization 11/23/2019:  1.  Severe two-vessel coronary artery disease with severe stenosis in the proximal left circumflex and moderately severe diffuse stenosis in the proximal LAD.  Both lesions correlate to CT findings where there were positive FFR results.  Both lesions treated successfully with DES platforms. 2.  Patent left main and RCA without significant stenosis 3.  Normal LVEDP  Recommendations: Dual antiplatelet therapy with aspirin and clopidogrel minimum 6 months without interruption, favor at least 12 months of therapy in this patient with proximal stenting of both the LAD and circumflex.  Aggressive risk reduction measures.  Same-day PCI protocol as long as no early complications arise.  Diagnostic Dominance: Right  Intervention    _____________   History of Present Illness     Matthew [name] is a 84 y.o. male with past medical history of prediabetes, hyperlipidemia, hypertension, mild to moderate aortic stenosis who is followed by Dr. Harriet Masson as an outpatient.  He presented in April 2021 to be evaluated for worsening chest tightness and shortness of breath.  Despite having a negative stress test the patient still continued to experience symptoms.  Last office visit August 2021 it was recommended that he undergo coronary CTA.  This showed an abnormal FFR in the mid to distal LAD with significant flow-limiting lesion, with left circumflex appearing to be occluded along with occlusion in OM1.  At his office visit on 10/5 this  was discussed and he was recommended to undergo cardiac catheterization.  He was placed on aspirin and continued on pravastatin with the addition of Zetia 10 mg daily, Imdur 30 mg daily and metoprolol 12.5 mg daily.  Hospital Course     The patient underwent cardiac cath as noted above with severe two-vessel CAD with stenosis of the proximal circumflex and moderately to severe diffuse stenosis in the proximal LAD.  Both lesions noted to correlate with CT findings that were positive with FFR result.  Each lesion treated with DES x1.  Patent left main and RCA without significant stenosis.. Plan for DAPT with ASA/Plavix for at least 6 months likely 1 year. The patient was seen by cardiac rehab while in short stay. There were no observed complications post cath. Radial cath site had some oozing and TR band was changed. Was re-evaluated prior to discharge and found to be stable without any complications. Instructions/precautions regarding cath site care were given prior to discharge.  [name] was seen by Dr. Burt Knack and determined stable for discharge home. Follow up with our office has been arranged. Medications are listed below. Pertinent changes include addition of plavix.  _____________  Cath/PCI Registry Performance & Quality Measures: 1. Aspirin prescribed? - Yes 2. ADP Receptor Inhibitor (Plavix/Clopidogrel, Brilinta/Ticagrelor or Effient/Prasugrel) prescribed (includes medically managed patients)? - Yes 3. High Intensity Statin (Lipitor 40-80mg  or Crestor 20-40mg ) prescribed? - No - intolerant 4. For EF <40%, was ACEI/ARB prescribed? - Not Applicable (EF >/= 23%) 5. For EF <40%, Aldosterone Antagonist (Spironolactone or Eplerenone) prescribed? - Not Applicable (EF >/= 76%) 6. Cardiac Rehab Phase II ordered (Included Medically  managed Patients)? - Yes  _____________   Discharge Vitals Blood pressure (!) 147/72, pulse 66, temperature 98 F (36.7 C), temperature source Oral, resp. rate  15, height 5\' 8"  (1.727 m), weight 94.8 kg, SpO2 93 %.  Filed Weights   11/23/19 0558  Weight: 94.8 kg    Last Labs & Radiologic Studies    CBC No results for input(s): WBC, NEUTROABS, HGB, HCT, MCV, PLT in the last 72 hours. Basic Metabolic Panel No results for input(s): NA, K, CL, CO2, GLUCOSE, BUN, CREATININE, CALCIUM, MG, PHOS in the last 72 hours. Liver Function Tests No results for input(s): AST, ALT, ALKPHOS, BILITOT, PROT, ALBUMIN in the last 72 hours. No results for input(s): LIPASE, AMYLASE in the last 72 hours. High Sensitivity Troponin:   No results for input(s): TROPONINIHS in the last 720 hours.  BNP Invalid input(s): POCBNP D-Dimer No results for input(s): DDIMER in the last 72 hours. Hemoglobin A1C No results for input(s): HGBA1C in the last 72 hours. Fasting Lipid Panel No results for input(s): CHOL, HDL, LDLCALC, TRIG, CHOLHDL, LDLDIRECT in the last 72 hours. Thyroid Function Tests No results for input(s): TSH, T4TOTAL, T3FREE, THYROIDAB in the last 72 hours.  Invalid input(s): FREET3 _____________  CARDIAC CATHETERIZATION  Result Date: 11/23/2019 1.  Severe two-vessel coronary artery disease with severe stenosis in the proximal left circumflex and moderately severe diffuse stenosis in the proximal LAD.  Both lesions correlate to CT findings where there were positive FFR results.  Both lesions treated successfully with DES platforms. 2.  Patent left main and RCA without significant stenosis 3.  Normal LVEDP Recommendations: Dual antiplatelet therapy with aspirin and clopidogrel minimum 6 months without interruption, favor at least 12 months of therapy in this patient with proximal stenting of both the LAD and circumflex.  Aggressive risk reduction measures.  Same-day PCI protocol as long as no early complications arise.  CT CORONARY FRACTIONAL FLOW RESERVE DATA PREP  Result Date: 11/16/2019 EXAM: CT FFR ANALYSIS CLINICAL DATA:  Chest pain/anginal equiv, 46yr  CHD risk > 20%, not treadmill candidate FINDINGS: FFRct analysis was performed on the original cardiac CT angiogram dataset. Diagrammatic representation of the FFRct analysis is provided in a separate PDF document in PACS. This dictation was created using the PDF document and an interactive 3D model of the results. 3D model is not available in the EMR/PACS. Normal FFR range is >0.80. 1. Left Main:  No significant stenosis. FFR = 0.99 2. LAD: No significant stenosis. Proximal FFR = 0.96, Mid FFR = 0.71, Distal FFR = 0.61 3. LCx: There appears to be occlusion of the proximal to mid-LCX or the distal vesesl was poorly imaged. 4. OM1: Large vesesl with significant stenosis. Proximal FFR = 0.91, Distal FFR = 0.77 (dropoff noted in the end of the proximal vessel) 5. RCA: No significant stenosis. Proximal FFR =0.99, Mid FFR = 0.92, Distal FFR = 0.83 (tapering) IMPRESSION: 1. CT FFR analysis demonstrated significant flow limiting stenosis of the proximal LAD and a large OM1 vessel. 2.  Further evaluation with coronary angiography is suggested. Electronically Signed   By: Pixie Casino M.D.   On: 11/03/2019 17:00   CT CORONARY FRACTIONAL FLOW RESERVE FLUID ANALYSIS  Result Date: 11/16/2019 EXAM: CT FFR ANALYSIS CLINICAL DATA:  Chest pain/anginal equiv, 27yr CHD risk > 20%, not treadmill candidate FINDINGS: FFRct analysis was performed on the original cardiac CT angiogram dataset. Diagrammatic representation of the FFRct analysis is provided in a separate PDF document in PACS. This dictation  was created using the PDF document and an interactive 3D model of the results. 3D model is not available in the EMR/PACS. Normal FFR range is >0.80. 1. Left Main:  No significant stenosis. FFR = 0.99 2. LAD: No significant stenosis. Proximal FFR = 0.96, Mid FFR = 0.71, Distal FFR = 0.61 3. LCx: There appears to be occlusion of the proximal to mid-LCX or the distal vesesl was poorly imaged. 4. OM1: Large vesesl with significant  stenosis. Proximal FFR = 0.91, Distal FFR = 0.77 (dropoff noted in the end of the proximal vessel) 5. RCA: No significant stenosis. Proximal FFR =0.99, Mid FFR = 0.92, Distal FFR = 0.83 (tapering) IMPRESSION: 1. CT FFR analysis demonstrated significant flow limiting stenosis of the proximal LAD and a large OM1 vessel. 2.  Further evaluation with coronary angiography is suggested. Electronically Signed   By: Pixie Casino M.D.   On: 11/03/2019 17:00    Disposition   Pt is being discharged home today in good condition.  Follow-up Plans & Appointments     Follow-up Information    Tobb, Godfrey Pick, DO Follow up on 12/08/2019.   Specialty: Cardiology Why: at 11:20am for your follow up appt.  Contact information: [address] [place] [address] [phone]              Discharge Instructions    Amb Referral to Cardiac Rehabilitation   Complete by: As directed    Diagnosis: Coronary Stents   After initial evaluation and assessments completed: Virtual Based Care may be provided alone or in conjunction with Phase 2 Cardiac Rehab based on patient barriers.: Yes      Discharge Medications   Allergies as of 11/23/2019      Reactions   Amlodipine    unknown   Atorvastatin Other (See Comments)   Muscle cramps   Clindamycin/lincomycin Other (See Comments)   Hives.   Erythromycin    REACTION: hives   Levofloxacin    REACTION: joint swelling   Lisinopril Cough   Penicillins    ?unknown reaction   Rosuvastatin Calcium    Sulfonamide Derivatives    REACTION: swelling   Tetracycline Hcl    REACTION: unspecified      Medication List    STOP taking these medications   omeprazole 40 MG capsule Commonly known as: PRILOSEC     TAKE these medications   aspirin EC 81 MG tablet Take 1 tablet (81 mg total) by mouth daily. Swallow whole.   clopidogrel 75 MG tablet Commonly known as: Plavix Take 1 tablet (75 mg total) by mouth daily.   ezetimibe 10 MG  tablet Commonly known as: ZETIA Take 1 tablet (10 mg total) by mouth daily.   fenofibrate micronized 134 MG capsule Commonly known as: LOFIBRA TAKE 1 CAPSULE EVERY DAY BEFORE BREAKFAST What changed:   how much to take  how to take this  when to take this  additional instructions   isosorbide mononitrate 30 MG 24 hr tablet Commonly known as: IMDUR Take 0.5 tablets (15 mg total) by mouth daily.   loratadine 10 MG tablet Commonly known as: CLARITIN Take 10 mg by mouth daily as needed (hay fever).   losartan 25 MG tablet Commonly known as: COZAAR TAKE 1 TABLET EVERY DAY   metoprolol succinate 25 MG 24 hr tablet Commonly known as: Toprol XL Take 0.5 tablets (12.5 mg total) by mouth daily.   multivitamin with minerals Tabs tablet Take 1 tablet by mouth daily.  nitroGLYCERIN 0.4 MG SL tablet Commonly known as: NITROSTAT Place 1 tablet (0.4 mg total) under the tongue every 5 (five) minutes as needed.   pantoprazole 40 MG tablet Commonly known as: Protonix Take 1 tablet (40 mg total) by mouth daily.   pravastatin 20 MG tablet Commonly known as: PRAVACHOL Take 1 tablet (20 mg total) by mouth every evening. What changed: additional instructions   spironolactone 25 MG tablet Commonly known as: ALDACTONE TAKE 1/2 TABLET BY MOUTH DAILY   vitamin C 1000 MG tablet Take 1,000 mg by mouth daily.   Vitamin D3 50 MCG (2000 UT) Tabs Take 2,000 Units by mouth daily.          Allergies Allergies  Allergen Reactions  . Amlodipine     unknown  . Atorvastatin Other (See Comments)    Muscle cramps  . Clindamycin/Lincomycin Other (See Comments)    Hives.  . Erythromycin     REACTION: hives  . Levofloxacin     REACTION: joint swelling  . Lisinopril Cough  . Penicillins     ?unknown reaction  . Rosuvastatin Calcium   . Sulfonamide Derivatives     REACTION: swelling  . Tetracycline Hcl     REACTION: unspecified    Outstanding Labs/Studies   N/a   Duration  of Discharge Encounter   Greater than 30 minutes including physician time.  Signed, Reino Bellis, NP 11/23/2019, 2:43 PM

## 2019-11-23 NOTE — Progress Notes (Signed)
Patient and wife was given discharge instructions. Both verbalized understanding. 

## 2019-11-23 NOTE — Progress Notes (Signed)
Patient had a continuous ooze from site.Cath lab was called to look at [facility]. Patient TRB was replaced and oozing has stopped.

## 2019-11-23 NOTE — Interval H&P Note (Signed)
History and Physical Interval Note:  11/23/2019 7:41 AM  Matthew [name]  has presented today for surgery, with the diagnosis of cad.  The various methods of treatment have been discussed with the patient and family. After consideration of risks, benefits and other options for treatment, the patient has consented to  Procedure(s): LEFT HEART CATH AND CORONARY ANGIOGRAPHY (N/A) as a surgical intervention.  The patient's history has been reviewed, patient examined, no change in status, stable for surgery.  I have reviewed the patient's chart and labs.  Questions were answered to the patient's satisfaction.     Sherren Mocha

## 2019-11-23 NOTE — Discharge Instructions (Signed)
Drink plenty of fluid for 48 hours and keep wrist elevated at heart level for 24 hours  Radial Site Care   This sheet gives you information about how to care for yourself after your procedure. Your health care provider may also give you more specific instructions. If you have problems or questions, contact your health care provider. What can I expect after the procedure? After the procedure, it is common to have:  Bruising and tenderness at the catheter insertion area. Follow these instructions at home: Medicines  Take over-the-counter and prescription medicines only as told by your health care provider. Insertion site care 1. Follow instructions from your health care provider about how to take care of your insertion site. Make sure you: ? Wash your hands with soap and water before you change your bandage (dressing). If soap and water are not available, use hand sanitizer. ? remove your dressing as told by your health care provider. In 24 hours 2. Check your insertion site every day for signs of infection. Check for: ? Redness, swelling, or pain. ? Fluid or blood. ? Pus or a bad smell. ? Warmth. 3. Do not take baths, swim, or use a hot tub until your health care provider approves. 4. You may shower 24-48 hours after the procedure, or as directed by your health care provider. ? Remove the dressing and gently wash the site with plain soap and water. ? Pat the area dry with a clean towel. ? Do not rub the site. That could cause bleeding. 5. Do not apply powder or lotion to the site. Activity   1. For 24 hours after the procedure, or as directed by your health care provider: ? Do not flex or bend the affected arm. ? Do not push or pull heavy objects with the affected arm. ? Do not drive yourself home from the hospital or clinic. You may drive 24 hours after the procedure unless your health care provider tells you not to. ? Do not operate machinery or power tools. 2. Do not lift  anything that is heavier than 10 lb (4.5 kg), or the limit that you are told, until your health care provider says that it is safe. For 4 days 3. Ask your health care provider when it is okay to: ? Return to work or school. ? Resume usual physical activities or sports. ? Resume sexual activity. General instructions  If the catheter site starts to bleed, raise your arm and put firm pressure on the site. If the bleeding does not stop, get help right away. This is a medical emergency.  If you went home on the same day as your procedure, a responsible adult should be with you for the first 24 hours after you arrive home.  Keep all follow-up visits as told by your health care provider. This is important. Contact a health care provider if:  You have a fever.  You have redness, swelling, or yellow drainage around your insertion site. Get help right away if:  You have unusual pain at the radial site.  The catheter insertion area swells very fast.  The insertion area is bleeding, and the bleeding does not stop when you hold steady pressure on the area.  Your arm or hand becomes pale, cool, tingly, or numb. These symptoms may represent a serious problem that is an emergency. Do not wait to see if the symptoms will go away. Get medical help right away. Call your local emergency services (911 in the [place] Do not   drive yourself to the hospital. Summary  After the procedure, it is common to have bruising and tenderness at the site.  Follow instructions from your health care provider about how to take care of your radial site wound. Check the wound every day for signs of infection.  Do not lift anything that is heavier than 10 lb (4.5 kg), or the limit that you are told, until your health care provider says that it is safe. This information is not intended to replace advice given to you by your health care provider. Make sure you discuss any questions you have with your health care  provider. Document Revised: 02/24/2017 Document Reviewed: 02/24/2017 Elsevier Patient Education  2020 [employer]  

## 2019-11-23 NOTE — Progress Notes (Signed)
CARDIAC REHAB PHASE I   Stent education completed with pt and wife. Pt educated on importance of ASA and Plavix. Pt given stent card and heart healthy diet. Reviewed site care, exercise guidelines, and restrictions. Will refer to CRP II [place], pt uninterested in attending.  [id number] Rufina Falco, RN BSN 11/23/2019 2:01 PM

## 2019-11-24 ENCOUNTER — Encounter (HOSPITAL_COMMUNITY): Payer: Self-pay | Admitting: Cardiovascular Disease

## 2019-11-27 ENCOUNTER — Telehealth: Payer: Self-pay | Admitting: Gastroenterology

## 2019-11-27 ENCOUNTER — Ambulatory Visit (INDEPENDENT_AMBULATORY_CARE_PROVIDER_SITE_OTHER): Payer: Medicare HMO | Admitting: Family Medicine

## 2019-11-27 ENCOUNTER — Other Ambulatory Visit: Payer: Self-pay

## 2019-11-27 DIAGNOSIS — I10 Essential (primary) hypertension: Secondary | ICD-10-CM

## 2019-11-27 DIAGNOSIS — E669 Obesity, unspecified: Secondary | ICD-10-CM

## 2019-11-27 DIAGNOSIS — J300 Vasomotor rhinitis: Secondary | ICD-10-CM | POA: Diagnosis not present

## 2019-11-27 DIAGNOSIS — E785 Hyperlipidemia, unspecified: Secondary | ICD-10-CM | POA: Diagnosis not present

## 2019-11-27 DIAGNOSIS — T7840XD Allergy, unspecified, subsequent encounter: Secondary | ICD-10-CM | POA: Diagnosis not present

## 2019-11-27 DIAGNOSIS — G4733 Obstructive sleep apnea (adult) (pediatric): Secondary | ICD-10-CM

## 2019-11-27 DIAGNOSIS — R739 Hyperglycemia, unspecified: Secondary | ICD-10-CM

## 2019-11-27 DIAGNOSIS — I25118 Atherosclerotic heart disease of native coronary artery with other forms of angina pectoris: Secondary | ICD-10-CM

## 2019-11-27 DIAGNOSIS — K5900 Constipation, unspecified: Secondary | ICD-10-CM | POA: Diagnosis not present

## 2019-11-27 DIAGNOSIS — K219 Gastro-esophageal reflux disease without esophagitis: Secondary | ICD-10-CM

## 2019-11-27 MED ORDER — AZELASTINE HCL 0.1 % NA SOLN: 2.0000 | Freq: Two times a day (BID) | NASAL | 1 refills | Status: DC

## 2019-11-27 NOTE — Telephone Encounter (Signed)
The pt would like an appt to discuss GERD symptoms.  Appt made for 12/15.  He tells me that Dr Burt Knack advised he stop omeprazole and start pantoprazole.  He has done that and states his symptoms have resolved but he would like the appt anyway.

## 2019-11-27 NOTE — Assessment & Plan Note (Signed)
hgba1c acceptable, minimize simple carbs. Increase exercise as tolerated.  
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
## 2019-11-27 NOTE — Assessment & Plan Note (Addendum)
Had two stents placed by Dr Burt Knack last week. He notes he continues to have episodes of burping and chest discomfort when that happens. Has f/u appt with Dr Harriet Masson soon

## 2019-11-27 NOTE — Assessment & Plan Note (Signed)
Well controlled, no changes to meds. Encouraged heart healthy diet such as the DASH diet and exercise as tolerated.  °

## 2019-11-27 NOTE — Assessment & Plan Note (Signed)
Tolerating statin, encouraged heart healthy diet, avoid trans fats, minimize simple carbs and saturated fats. Increase exercise as tolerated 

## 2019-11-27 NOTE — Assessment & Plan Note (Signed)
Goes most days just has to strain. Consider a 1/2 dose of Miralax with a dose of Benefiber daily with plenty of fluids. Follow with gastroenterology

## 2019-11-27 NOTE — Assessment & Plan Note (Signed)
Encouraged a heart healthy diet, decrease po intake and increase activity, as tolerated. Needs 7-8 hours of sleep nightly. Avoid trans fats, eat small, frequent meals every 4-5 hours with lean proteins, complex carbs and healthy fats. Minimize simple carbs

## 2019-11-27 NOTE — Assessment & Plan Note (Addendum)
Follows with pulmonology, Dr Annamaria Boots, is no longer using CPAP secondary to having a surgery with Dr Laurance Flatten in [facility], he longer needs his nasal sprays or CPAP. Is scheduled with Dr Annamaria Boots for further evaluation

## 2019-11-27 NOTE — Assessment & Plan Note (Signed)
Try Astelin daily to see if that helps his rhinitis

## 2019-11-27 NOTE — Assessment & Plan Note (Signed)
No longer taking astelin or flonase or claritin. Is having almost vasomotor clear runny nose in am will try adding the Astelin back

## 2019-11-27 NOTE — Patient Instructions (Addendum)
Consider 1/2 dose of miralax daily mixed with Benefiber dose each day  Make sure to hydrate 60-80 ounces of fluids daily, continue to stay active and walk     Hypertension, Adult High blood pressure (hypertension) is when the force of blood pumping through the arteries is too strong. The arteries are the blood vessels that carry blood from the heart throughout the body. Hypertension forces the heart to work harder to pump blood and may cause arteries to become narrow or stiff. Untreated or uncontrolled hypertension can cause a heart attack, heart failure, a stroke, kidney disease, and other problems. A blood pressure reading consists of a higher number over a lower number. Ideally, your blood pressure should be below 120/80. The first ("top") number is called the systolic pressure. It is a measure of the pressure in your arteries as your heart beats. The second ("bottom") number is called the diastolic pressure. It is a measure of the pressure in your arteries as the heart relaxes. What are the causes? The exact cause of this condition is not known. There are some conditions that result in or are related to high blood pressure. What increases the risk? Some risk factors for high blood pressure are under your control. The following factors may make you more likely to develop this condition:  Smoking.  Having type 2 diabetes mellitus, high cholesterol, or both.  Not getting enough exercise or physical activity.  Being overweight.  Having too much fat, sugar, calories, or salt (sodium) in your diet.  Drinking too much alcohol. Some risk factors for high blood pressure may be difficult or impossible to change. Some of these factors include:  Having chronic kidney disease.  Having a family history of high blood pressure.  Age. Risk increases with age.  Race. You may be at higher risk if you are African American.  Gender. Men are at higher risk than women before age 30. After age 29,  women are at higher risk than men.  Having obstructive sleep apnea.  Stress. What are the signs or symptoms? High blood pressure may not cause symptoms. Very high blood pressure (hypertensive crisis) may cause:  Headache.  Anxiety.  Shortness of breath.  Nosebleed.  Nausea and vomiting.  Vision changes.  Severe chest pain.  Seizures. How is this diagnosed? This condition is diagnosed by measuring your blood pressure while you are seated, with your arm resting on a flat surface, your legs uncrossed, and your feet flat on the floor. The cuff of the blood pressure monitor will be placed directly against the skin of your upper arm at the level of your heart. It should be measured at least twice using the same arm. Certain conditions can cause a difference in blood pressure between your right and left arms. Certain factors can cause blood pressure readings to be lower or higher than normal for a short period of time:  When your blood pressure is higher when you are in a health care provider's office than when you are at home, this is called white coat hypertension. Most people with this condition do not need medicines.  When your blood pressure is higher at home than when you are in a health care provider's office, this is called masked hypertension. Most people with this condition may need medicines to control blood pressure. If you have a high blood pressure reading during one visit or you have normal blood pressure with other risk factors, you may be asked to:  Return on a different  day to have your blood pressure checked again.  Monitor your blood pressure at home for 1 week or longer. If you are diagnosed with hypertension, you may have other blood or imaging tests to help your health care provider understand your overall risk for other conditions. How is this treated? This condition is treated by making healthy lifestyle changes, such as eating healthy foods, exercising more,  and reducing your alcohol intake. Your health care provider may prescribe medicine if lifestyle changes are not enough to get your blood pressure under control, and if:  Your systolic blood pressure is above 130.  Your diastolic blood pressure is above 80. Your personal target blood pressure may vary depending on your medical conditions, your age, and other factors. Follow these instructions at home: Eating and drinking   Eat a diet that is high in fiber and potassium, and low in sodium, added sugar, and fat. An example eating plan is called the DASH (Dietary Approaches to Stop Hypertension) diet. To eat this way: ? Eat plenty of fresh fruits and vegetables. Try to fill one half of your plate at each meal with fruits and vegetables. ? Eat whole grains, such as whole-wheat pasta, brown rice, or whole-grain bread. Fill about one fourth of your plate with whole grains. ? Eat or drink low-fat dairy products, such as skim milk or low-fat yogurt. ? Avoid fatty cuts of meat, processed or cured meats, and poultry with skin. Fill about one fourth of your plate with lean proteins, such as fish, chicken without skin, beans, eggs, or tofu. ? Avoid pre-made and processed foods. These tend to be higher in sodium, added sugar, and fat.  Reduce your daily sodium intake. Most people with hypertension should eat less than 1,500 mg of sodium a day.  Do not drink alcohol if: ? Your health care provider tells you not to drink. ? You are pregnant, may be pregnant, or are planning to become pregnant.  If you drink alcohol: ? Limit how much you use to:  0-1 drink a day for women.  0-2 drinks a day for men. ? Be aware of how much alcohol is in your drink. In the [place] one drink equals one 12 oz bottle of beer (355 mL), one 5 oz glass of wine (148 mL), or one 1 oz glass of hard liquor (44 mL). Lifestyle   Work with your health care provider to maintain a healthy body weight or to lose weight. Ask what an  ideal weight is for you.  Get at least 30 minutes of exercise most days of the week. Activities may include walking, swimming, or biking.  Include exercise to strengthen your muscles (resistance exercise), such as Pilates or lifting weights, as part of your weekly exercise routine. Try to do these types of exercises for 30 minutes at least 3 days a week.  Do not use any products that contain nicotine or tobacco, such as cigarettes, e-cigarettes, and chewing tobacco. If you need help quitting, ask your health care provider.  Monitor your blood pressure at home as told by your health care provider.  Keep all follow-up visits as told by your health care provider. This is important. Medicines  Take over-the-counter and prescription medicines only as told by your health care provider. Follow directions carefully. Blood pressure medicines must be taken as prescribed.  Do not skip doses of blood pressure medicine. Doing this puts you at risk for problems and can make the medicine less effective.  Ask your health  care provider about side effects or reactions to medicines that you should watch for. Contact a health care provider if you:  Think you are having a reaction to a medicine you are taking.  Have headaches that keep coming back (recurring).  Feel dizzy.  Have swelling in your ankles.  Have trouble with your vision. Get help right away if you:  Develop a severe headache or confusion.  Have unusual weakness or numbness.  Feel faint.  Have severe pain in your chest or abdomen.  Vomit repeatedly.  Have trouble breathing. Summary  Hypertension is when the force of blood pumping through your arteries is too strong. If this condition is not controlled, it may put you at risk for serious complications.  Your personal target blood pressure may vary depending on your medical conditions, your age, and other factors. For most people, a normal blood pressure is less than  120/80.  Hypertension is treated with lifestyle changes, medicines, or a combination of both. Lifestyle changes include losing weight, eating a healthy, low-sodium diet, exercising more, and limiting alcohol. This information is not intended to replace advice given to you by your health care provider. Make sure you discuss any questions you have with your health care provider. Document Revised: 09/29/2017 Document Reviewed: 09/29/2017 Elsevier Patient Education  2020 [employer].

## 2019-11-27 NOTE — Assessment & Plan Note (Signed)
Still having excessive burping and chest discomfort despite the cardiac stenting. Has tried reflux meds and Gas X but did not see any benefit he thought but when he did not take any pantoprazole he had increased burning. With 1 tab the burning improves but not the burping. Increasing to 2 tabs no further improvement. Is seeing Dr Ardis Hughs of GI next week to discuss more options.

## 2019-11-28 ENCOUNTER — Other Ambulatory Visit: Payer: Self-pay

## 2019-11-28 DIAGNOSIS — N529 Male erectile dysfunction, unspecified: Secondary | ICD-10-CM | POA: Diagnosis not present

## 2019-11-28 DIAGNOSIS — C61 Malignant neoplasm of prostate: Secondary | ICD-10-CM | POA: Diagnosis not present

## 2019-11-28 DIAGNOSIS — N393 Stress incontinence (female) (male): Secondary | ICD-10-CM | POA: Diagnosis not present

## 2019-11-28 DIAGNOSIS — E611 Iron deficiency: Secondary | ICD-10-CM

## 2019-11-28 LAB — CBC
HCT: 37.1 % — ABNORMAL LOW (ref 38.5–50.0)
Hemoglobin: 12.6 g/dL — ABNORMAL LOW (ref 13.2–17.1)
MCH: 30.2 pg (ref 27.0–33.0)
MCHC: 34 g/dL (ref 32.0–36.0)
MCV: 89 fL (ref 80.0–100.0)
MPV: 10.9 fL (ref 7.5–12.5)
Platelets: 209 10*3/uL (ref 140–400)
RBC: 4.17 10*6/uL — ABNORMAL LOW (ref 4.20–5.80)
RDW: 12.6 % (ref 11.0–15.0)
WBC: 8.3 10*3/uL (ref 3.8–10.8)

## 2019-11-28 LAB — COMPREHENSIVE METABOLIC PANEL
AG Ratio: 1.6 (calc) (ref 1.0–2.5)
ALT: 18 U/L (ref 9–46)
AST: 22 U/L (ref 10–35)
Albumin: 4.1 g/dL (ref 3.6–5.1)
Alkaline phosphatase (APISO): 49 U/L (ref 35–144)
BUN: 10 mg/dL (ref 7–25)
CO2: 27 mmol/L (ref 20–32)
Calcium: 9.7 mg/dL (ref 8.6–10.3)
Chloride: 100 mmol/L (ref 98–110)
Creat: 0.97 mg/dL (ref 0.70–1.11)
Globulin: 2.6 g/dL (calc) (ref 1.9–3.7)
Glucose, Bld: 108 mg/dL — ABNORMAL HIGH (ref 65–99)
Potassium: 4.5 mmol/L (ref 3.5–5.3)
Sodium: 134 mmol/L — ABNORMAL LOW (ref 135–146)
Total Bilirubin: 0.5 mg/dL (ref 0.2–1.2)
Total Protein: 6.7 g/dL (ref 6.1–8.1)

## 2019-11-28 LAB — LIPID PANEL
Cholesterol: 153 mg/dL (ref <200)
HDL: 39 mg/dL — ABNORMAL LOW (ref >=40)
LDL Cholesterol (Calc): 96 mg/dL (calc)
Non-HDL Cholesterol (Calc): 114 mg/dL (calc) (ref <130)
Total CHOL/HDL Ratio: 3.9 (calc) (ref <5.0)
Triglycerides: 87 mg/dL (ref <150)

## 2019-11-28 LAB — HEMOGLOBIN A1C
Hgb A1c MFr Bld: 6 % of total Hgb — ABNORMAL HIGH (ref <5.7)
Mean Plasma Glucose: 126 (calc)
eAG (mmol/L): 7 (calc)

## 2019-11-28 LAB — TSH: TSH: 1.95 mIU/L (ref 0.40–4.50)

## 2019-11-28 NOTE — Progress Notes (Signed)
Pt is aware of labs. Scheduled for labs 12/25/19. IFOB is in the mail

## 2019-12-05 ENCOUNTER — Other Ambulatory Visit: Payer: Medicare HMO

## 2019-12-05 DIAGNOSIS — E611 Iron deficiency: Secondary | ICD-10-CM

## 2019-12-05 LAB — FECAL OCCULT BLOOD, IMMUNOCHEMICAL: Fecal Occult Bld: NEGATIVE

## 2019-12-05 NOTE — Progress Notes (Signed)
Attempted to reach and LVM to call back

## 2019-12-06 ENCOUNTER — Encounter: Payer: Self-pay | Admitting: *Deleted

## 2019-12-08 ENCOUNTER — Other Ambulatory Visit: Payer: Self-pay

## 2019-12-08 ENCOUNTER — Ambulatory Visit (INDEPENDENT_AMBULATORY_CARE_PROVIDER_SITE_OTHER): Payer: Medicare HMO | Admitting: Cardiology

## 2019-12-08 ENCOUNTER — Encounter: Payer: Self-pay | Admitting: Cardiology

## 2019-12-08 VITALS — BP 130/68 | HR 70 | Ht 68.0 in | Wt 206.0 lb

## 2019-12-08 DIAGNOSIS — I10 Essential (primary) hypertension: Secondary | ICD-10-CM

## 2019-12-08 DIAGNOSIS — I2510 Atherosclerotic heart disease of native coronary artery without angina pectoris: Secondary | ICD-10-CM

## 2019-12-08 DIAGNOSIS — G4733 Obstructive sleep apnea (adult) (pediatric): Secondary | ICD-10-CM | POA: Diagnosis not present

## 2019-12-08 DIAGNOSIS — E669 Obesity, unspecified: Secondary | ICD-10-CM | POA: Diagnosis not present
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
## 2019-12-08 NOTE — Patient Instructions (Addendum)
Medication Instructions:  *If you need a refill on your cardiac medications before your next appointment, please call your pharmacy*  Follow-Up: At [facility], you and your health needs are our priority.  As part of our continuing mission to provide you with exceptional heart care, we have created designated Provider Care Teams.  These Care Teams include your primary Cardiologist (physician) and Advanced Practice Providers (APPs -  Physician Assistants and Nurse Practitioners) who all work together to provide you with the care you need, when you need it.  We recommend signing up for the patient portal called "MyChart".  Sign up information is provided on this After Visit Summary.  MyChart is used to connect with patients for Virtual Visits (Telemedicine).  Patients are able to view lab/test results, encounter notes, upcoming appointments, etc.  Non-urgent messages can be sent to your provider as well.   To learn more about what you can do with MyChart, go to [email or web address].    Your next appointment:   Your physician recommends that you keep your scheduled follow-up appointment in: 6 MONTHS with Dr. Harriet Masson on Monday 03/18/20 at 9:20 am

## 2019-12-11 ENCOUNTER — Other Ambulatory Visit: Payer: Self-pay

## 2019-12-11 MED ORDER — PANTOPRAZOLE SODIUM 40 MG PO TBEC: 40.0000 mg | DELAYED_RELEASE_TABLET | Freq: Every day | ORAL | 3 refills | Status: DC

## 2019-12-11 NOTE — Telephone Encounter (Signed)
This medication was prescribed to pt in the hospital. Pt requesting it be sent to [facility] mail order pharmacy. Please address

## 2019-12-12 ENCOUNTER — Telehealth: Payer: Self-pay | Admitting: Cardiology

## 2019-12-12 NOTE — Telephone Encounter (Signed)
Spoke to patient just now and let him know that this medication was called in yesterday to [facility] for him. He verbalizes understanding and thanks me for the call back

## 2019-12-12 NOTE — Telephone Encounter (Signed)
Pt c/o medication issue:  1. Name of Medication: pantoprazole (PROTONIX) 40 MG tablet  2. How are you currently taking this medication (dosage and times per day)? As directed  3. Are you having a reaction (difficulty breathing--STAT)? no  4. What is your medication issue? Patient states that he wants this medication switched over to [facility]. He says that Dr. Burt Knack put him on this medication when he had his stents put in on 10/21. He was told by [facility] that Dr. Burt Knack needs to approve of this and has 9 days to do so. He does not need a refill now but wants to be prepared. I'm not really sure if this should go to Dr. Burt Knack. It looks like Dr. Harriet Masson sent a refill request in on 11/08 to [facility]. Please advise.

## 2019-12-13 ENCOUNTER — Ambulatory Visit: Payer: Medicare HMO | Admitting: Cardiology

## 2019-12-18 ENCOUNTER — Ambulatory Visit (INDEPENDENT_AMBULATORY_CARE_PROVIDER_SITE_OTHER): Payer: Medicare HMO

## 2019-12-18 ENCOUNTER — Other Ambulatory Visit: Payer: Self-pay

## 2019-12-18 DIAGNOSIS — Z23 Encounter for immunization: Secondary | ICD-10-CM | POA: Diagnosis not present

## 2019-12-20 NOTE — Addendum Note (Signed)
Addended by: Kelle Darting A on: 12/20/2019 03:44 PM   Modules accepted: Orders

## 2019-12-21 ENCOUNTER — Other Ambulatory Visit: Payer: Self-pay

## 2019-12-21 ENCOUNTER — Ambulatory Visit: Payer: Medicare HMO

## 2019-12-21 DIAGNOSIS — G4733 Obstructive sleep apnea (adult) (pediatric): Secondary | ICD-10-CM

## 2019-12-21 DIAGNOSIS — R0683 Snoring: Secondary | ICD-10-CM

## 2019-12-25 ENCOUNTER — Telehealth: Payer: Self-pay

## 2019-12-25 ENCOUNTER — Other Ambulatory Visit: Payer: Self-pay

## 2019-12-25 ENCOUNTER — Other Ambulatory Visit (INDEPENDENT_AMBULATORY_CARE_PROVIDER_SITE_OTHER): Payer: Medicare HMO

## 2019-12-25 DIAGNOSIS — E611 Iron deficiency: Secondary | ICD-10-CM | POA: Diagnosis not present

## 2019-12-25 LAB — IBC + FERRITIN
Ferritin: 203 ng/mL (ref 22.0–322.0)
Iron: 73 ug/dL (ref 42–165)
Saturation Ratios: 18.4 % — ABNORMAL LOW (ref 20.0–50.0)
Transferrin: 283 mg/dL (ref 212.0–360.0)

## 2019-12-25 LAB — CBC
HCT: 39.5 % (ref 39.0–52.0)
Hemoglobin: 13.4 g/dL (ref 13.0–17.0)
MCHC: 33.9 g/dL (ref 30.0–36.0)
MCV: 87.6 fl (ref 78.0–100.0)
Platelets: 205 10*3/uL (ref 150.0–400.0)
RBC: 4.51 Mil/uL (ref 4.22–5.81)
RDW: 13.8 % (ref 11.5–15.5)
WBC: 7.5 10*3/uL (ref 4.0–10.5)

## 2019-12-25 NOTE — Telephone Encounter (Signed)
Pt is aware of lab results.

## 2019-12-25 NOTE — Telephone Encounter (Signed)
-----   Message from [name], MD sent at 12/25/2019 12:30 PM EST ----- Anemia resolved with current labs no changes. Please notify patient

## 2020-01-03 ENCOUNTER — Other Ambulatory Visit: Payer: Self-pay

## 2020-01-03 ENCOUNTER — Ambulatory Visit: Payer: Medicare HMO | Admitting: Podiatry

## 2020-01-03 DIAGNOSIS — B351 Tinea unguium: Secondary | ICD-10-CM

## 2020-01-03 DIAGNOSIS — M79675 Pain in left toe(s): Secondary | ICD-10-CM

## 2020-01-03 DIAGNOSIS — M79674 Pain in right toe(s): Secondary | ICD-10-CM

## 2020-01-03 DIAGNOSIS — G4733 Obstructive sleep apnea (adult) (pediatric): Secondary | ICD-10-CM | POA: Diagnosis not present

## 2020-01-04 ENCOUNTER — Encounter: Payer: Self-pay | Admitting: Podiatry

## 2020-01-04 NOTE — Progress Notes (Signed)
  Subjective:  Patient ID: Matthew [name], male    DOB: May 10, 1935,  MRN: [id number]  Chief Complaint  Patient presents with  . Routine foot care     3 month routine foot care   84 y.o. male returns for the above complaint.  Patient is here for painful thickened elongated mycotic toenails x10.  Patient states that he has not been able to cut his own toenails because they are gone so dystrophic.  Patient states that he is not able to cut it them.  He denies any other acute complaints  Objective:  There were no vitals filed for this visit. Podiatric Exam: Vascular: dorsalis pedis and posterior tibial pulses are palpable bilateral. Capillary return is immediate. Temperature gradient is WNL. Skin turgor WNL  Sensorium: Normal Semmes Weinstein monofilament test. Normal tactile sensation bilaterally. Nail Exam: Pt has thick disfigured discolored nails with subungual debris noted bilateral entire nail hallux through fifth toenails Ulcer Exam: There is no evidence of ulcer or pre-ulcerative changes or infection. Orthopedic Exam: Muscle tone and strength are WNL. No limitations in general ROM. No crepitus or effusions noted. HAV  B/L.  Hammer toes 2-5  B/L. Skin: No Porokeratosis. No infection or ulcers.  Sweatiness without porokeratosis noted to bilateral lower extremity  Assessment & Plan:  Patient was evaluated and treated and all questions answered.  Onychomycosis with pain  -Nails palliatively debrided as below. -Educated on self-care  Left hallux ingrown nail/dystrophic nail -I discussed with the patient extensive detail about the etiology of ingrown nail and various treatment options were discussed.  I believe patient will benefit from removal of the ingrown toenail however patient would like to hold off for now and will think about it.  We will discuss this at a later time to have it removed.  Hyperhidrosis bilateral plantar feet -I explained to patient the etiology of hyperhidrosis and  various treatment options were extensively discussed.  Given that this is mild to moderate in nature I believe patient will benefit from [employer] foot powder.  I encouraged using it twice a day.  Patient states understanding will do so.   Procedure: Nail Debridement Rationale: pain  Type of Debridement: manual, sharp debridement. Instrumentation: Nail nipper, rotary burr. Number of Nails: 10  Procedures and Treatment: Consent by patient was obtained for treatment procedures. The patient understood the discussion of treatment and procedures well. All questions were answered thoroughly reviewed. Debridement of mycotic and hypertrophic toenails, 1 through 5 bilateral and clearing of subungual debris. No ulceration, no infection noted.  Return Visit-Office Procedure: Patient instructed to return to the office for a follow up visit 3 months for continued evaluation and treatment.  Boneta Lucks, DPM    No follow-ups on file.

## 2020-01-11 ENCOUNTER — Telehealth: Payer: Self-pay | Admitting: Internal Medicine

## 2020-01-11 NOTE — Telephone Encounter (Signed)
Patient had home sleep test 12/22/19.  Dr. Annamaria Boots please advise on results, thanks!

## 2020-01-11 NOTE — Telephone Encounter (Signed)
Home sleep test showed severe obstructive sleep apnea, averaging 46 apneas/ hour with drops in blood oxygen level. I think his DME had been [employer] Recommend order to replace old CPAP machine, auto 5-15, mask of choice, humidifier, supplies, [facility]View/ card He will need appointment to be seen in 31-90 days per insurance regs

## 2020-01-11 NOTE — Telephone Encounter (Signed)
Attempted to contact patient, left message to return call.

## 2020-01-17 ENCOUNTER — Encounter: Payer: Self-pay | Admitting: Gastroenterology

## 2020-01-17 ENCOUNTER — Ambulatory Visit (INDEPENDENT_AMBULATORY_CARE_PROVIDER_SITE_OTHER): Payer: Medicare HMO | Admitting: Gastroenterology

## 2020-01-17 VITALS — BP 142/74 | HR 68 | Ht 68.0 in | Wt 207.8 lb

## 2020-01-17 DIAGNOSIS — R142 Eructation: Secondary | ICD-10-CM | POA: Diagnosis not present

## 2020-01-17 DIAGNOSIS — R1013 Epigastric pain: Secondary | ICD-10-CM

## 2020-01-17 MED ORDER — FAMOTIDINE 40 MG PO TABS: ORAL_TABLET | ORAL | 3 refills | Status: DC
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
## 2020-01-17 NOTE — Patient Instructions (Addendum)
If you are age 82 or older, your body mass index should be between 23-30. Your Body mass index is 31.6 kg/m. If this is out of the aforementioned range listed, please consider follow up with your Primary Care Provider.  If you are age 45 or younger, your body mass index should be between 19-25. Your Body mass index is 31.6 kg/m. If this is out of the aformentioned range listed, please consider follow up with your Primary Care Provider.    Please stop taking Pantoprazole and Omeprazole.  Please start taking Pepcid 40 MG, one tablet in the morning and one tablet in the evening. We have sent this to your pharmacy.  Please start taking citrucel (orange flavored) powder fiber supplement.  This may cause some bloating at first but that usually goes away. Begin with a small spoonful and work your way up to a large, heaping spoonful daily over a week.   Please follow up in 2-3 months.  It was great seeing you today!  Thank you for entrusting me with your care and choosing [facility]  Dr. Ardis Hughs

## 2020-01-22 NOTE — Telephone Encounter (Signed)
lmtcb for pt.  

## 2020-01-23 ENCOUNTER — Encounter: Payer: Self-pay | Admitting: Family Medicine

## 2020-01-23 DIAGNOSIS — D0462 Carcinoma in situ of skin of left upper limb, including shoulder: Secondary | ICD-10-CM | POA: Diagnosis not present

## 2020-01-23 DIAGNOSIS — D045 Carcinoma in situ of skin of trunk: Secondary | ICD-10-CM | POA: Diagnosis not present

## 2020-01-23 DIAGNOSIS — D485 Neoplasm of uncertain behavior of skin: Secondary | ICD-10-CM | POA: Diagnosis not present

## 2020-01-23 DIAGNOSIS — Z85828 Personal history of other malignant neoplasm of skin: Secondary | ICD-10-CM | POA: Diagnosis not present

## 2020-01-24 NOTE — Telephone Encounter (Signed)
Sorry you took the brunt of that [place]. Certainly no fault of yours. I will talk with him when he comes sin.

## 2020-01-24 NOTE — Telephone Encounter (Signed)
Called and spoke to pt. Attempted to inform pt of the results but he continued to talk over me and would not let me explain the results. Pt states "I already know the test sucked and I will talk about it with Dr. Annamaria Boots when I come in". Pt then hung up the phone.   Will forward to Dr. Annamaria Boots as Juluis Rainier as the order has not been placed. Pt has an OV with [facility] on 1.20.2022.

## 2020-02-12 ENCOUNTER — Telehealth: Payer: Self-pay | Admitting: Cardiology

## 2020-02-12 DIAGNOSIS — R040 Epistaxis: Secondary | ICD-10-CM

## 2020-02-12 NOTE — Addendum Note (Signed)
Addended by: Resa Miner I on: 02/12/2020 09:20 AM   Modules accepted: Orders

## 2020-02-12 NOTE — Telephone Encounter (Signed)
Left message on patients voicemail to please return our call.   

## 2020-02-12 NOTE — Telephone Encounter (Signed)
Spoke to the patient just now and let him know that these nose bleeds are more than likely from his aspirin and plavix being taken together. However, he needs to remain taking these medications. Dr. Harriet Masson has suggested that he see an ENT and this referral was placed. Patient is aware and verbalizes understanding.    Encouraged patient to call back with any questions or concerns.

## 2020-02-12 NOTE — Telephone Encounter (Signed)
Pt c/o medication issue:  1. Name of Medication: pravastatin (PRAVACHOL) 20 MG tablet  2. How are you currently taking this medication (dosage and times per day)? As directed  3. Are you having a reaction (difficulty breathing--STAT)?   4. What is your medication issue? Patient said the statins cause him to have nosebleeds which often make him have to go to the ER to get them packed to stop.Patient said when he was on statins 4 years ago the same thing happened and as soon as he stopped.   Patient wanted to know if there is a substitute that he can take to avoid nosebleeds.  Patient said he has to take his wife to the doctor at 10:00 and may be away from the house this morning. He asks that if the office can not reach him at home to please leave a detailed message on his home phone.

## 2020-02-14 NOTE — Telephone Encounter (Signed)
Returned call to patient to clarify his message. He states he has been trying to call Dr. Pollie Friar office but only gets a busy signal. I had him read the number he is dialing and gave him a different number that we have on file for Dr. Lucia Gaskins. He agrees to try this number. He reports concern that statin medications cause nosebleeds but has also been reading about Plavix and aspirin. I explained the reason he was prescribed dual anti-platelet therapy and advised that the ENT can possibly treat him to prevent further nosebleeds so that he can continue the DAPT. He verbalized understanding and agreement with plan.  I advised him to call back with additional questions or concerns and he thanked me for the call.

## 2020-02-14 NOTE — Telephone Encounter (Signed)
Patient states he has not had another bloody nose. He states he has tried calling the ENT several times, but it is a busy signal. He states he is not sure if he needs to tell a nurse this. He states he will not be trying to contact them again. He states they called once, but he does not answer phone calls if he does not recognize the number. He states he a nurse needs to speak with him she can, but he won't be calling back for this.

## 2020-02-20 NOTE — Progress Notes (Deleted)
11/17/19- 26 yoM for sleep evaluation with hx OSA Medical problem list includes HTN, Nasal turbinate hypertrophy/ Surgery, GERD, Lumbar Spondylosis/ DDD, Prostate Cancer,  HST 11/25/16 AHI 50.4/ hr, desat to 70% with average 89%, body weight 205 lbs Last saw Dr Halford Chessman in 2018 CPAP 8 cwp / ? [employer] Download compliance 10%, AHI 2.7/ hr   [facility] record stopped 9/11. Body weight today- 209 lbs Covid vax- done at [facility]- had -----Patient needs to have a sleep study, had a Turbinoplasty a year ago. Snores loudly, sleeps in recliner, wakes up several times a night to go to bathroom Sleeps in recliner to avoid waking wife. Wife is aphasic after CVA. He is up frequently for nocturia post resection for Prostate cancer. He doesn't want to disturb her so he sleeps in recliner and is used to this.  We discussed alternatives to CPAP briefly. Other options might be simpler if he has to get up often. He likes So-Clean machine. Seems inclined to remain with CPAP but we can discuss after sleep study updated. Being evaluated for exertional chest tightness relieved by burping- GI and Cardiology w/u pending.   02/21/20- 84 yoM followed for OSA, complicated by  CAD/ stent, HTN, Nasal turbinate hypertrophy/ Surgery, GERD, Lumbar Spondylosis/ DDD, Prostate Cancer,  HST 12/21/19- AHI 46/ hr, desaturation to 74%, Body weight 209 lbs ?CPAP 8/ [employer] Download- Body weight today- Covid vax- Flu vax-    ROS-see HPI   + = positive Constitutional:    weight loss, night sweats, fevers, chills, fatigue, lassitude. HEENT:    headaches, difficulty swallowing, tooth/dental problems, sore throat,       sneezing, itching, ear ache, nasal congestion, post nasal drip, snoring CV:    chest pain, orthopnea, PND, swelling in lower extremities, anasarca,                                  dizziness, palpitations Resp:   shortness of breath with exertion or at rest.                productive cough,   non-productive  cough, coughing up of blood.              change in color of mucus.  wheezing.   Skin:    rash or lesions. GI:  No-   heartburn, indigestion, abdominal pain, nausea, vomiting, diarrhea,                 change in bowel habits, loss of appetite GU: dysuria, change in color of urine, no urgency or frequency.   flank pain. MS:   joint pain, stiffness, decreased range of motion, back pain. Neuro-     nothing unusual Psych:  change in mood or affect.  depression or anxiety.   memory loss.  OBJ- Physical Exam General- Alert, Oriented, Affect-appropriate, Distress- none acute, + overweight Skin- rash-none, lesions- none, excoriation- none Lymphadenopathy- none Head- atraumatic            Eyes- Gross vision intact, PERRLA, conjunctivae and secretions clear            Ears- Hearing, canals-normal            Nose- Clear, no-Septal dev, mucus, polyps, erosion, perforation             Throat- Mallampati II , mucosa clear , drainage- none, tonsils- atrophic, + dental repair Neck- flexible , trachea midline, no stridor ,  thyroid nl, carotid no bruit Chest - symmetrical excursion , unlabored           Heart/CV- RRR , no murmur , no gallop  , no rub, nl s1 s2                           - JVD- none , edema- none, stasis changes- none, varices- none           Lung- clear to P&A, wheeze- none, cough- none , dullness-none, rub- none           Chest wall-  Abd-  Br/ Gen/ Rectal- Not done, not indicated Extrem- cyanosis- none, clubbing, none, atrophy- none, strength- nl Neuro- grossly intact to observation

## 2020-02-22 ENCOUNTER — Ambulatory Visit: Payer: Medicare HMO | Admitting: Internal Medicine

## 2020-02-29 ENCOUNTER — Other Ambulatory Visit: Payer: Self-pay

## 2020-02-29 ENCOUNTER — Ambulatory Visit (INDEPENDENT_AMBULATORY_CARE_PROVIDER_SITE_OTHER): Payer: Medicare HMO | Admitting: Otolaryngology

## 2020-02-29 VITALS — Temp 97.2°F

## 2020-02-29 DIAGNOSIS — R040 Epistaxis: Secondary | ICD-10-CM

## 2020-02-29 DIAGNOSIS — J310 Chronic rhinitis: Secondary | ICD-10-CM | POA: Diagnosis not present
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
## 2020-03-15 DIAGNOSIS — I10 Essential (primary) hypertension: Secondary | ICD-10-CM | POA: Insufficient documentation

## 2020-03-15 DIAGNOSIS — Z8619 Personal history of other infectious and parasitic diseases: Secondary | ICD-10-CM | POA: Insufficient documentation

## 2020-03-18 ENCOUNTER — Encounter: Payer: Self-pay | Admitting: Cardiology

## 2020-03-18 ENCOUNTER — Other Ambulatory Visit: Payer: Self-pay

## 2020-03-18 ENCOUNTER — Ambulatory Visit (INDEPENDENT_AMBULATORY_CARE_PROVIDER_SITE_OTHER): Payer: Medicare HMO | Admitting: Cardiology

## 2020-03-18 VITALS — BP 128/68 | HR 70 | Ht 68.0 in | Wt 212.0 lb

## 2020-03-18 DIAGNOSIS — I10 Essential (primary) hypertension: Secondary | ICD-10-CM | POA: Diagnosis not present

## 2020-03-18 DIAGNOSIS — I2510 Atherosclerotic heart disease of native coronary artery without angina pectoris: Secondary | ICD-10-CM | POA: Insufficient documentation

## 2020-03-18 DIAGNOSIS — G4733 Obstructive sleep apnea (adult) (pediatric): Secondary | ICD-10-CM

## 2020-03-18 DIAGNOSIS — E782 Mixed hyperlipidemia: Secondary | ICD-10-CM | POA: Insufficient documentation

## 2020-03-18 DIAGNOSIS — E669 Obesity, unspecified: Secondary | ICD-10-CM

## 2020-03-18 MED ORDER — CLOPIDOGREL BISULFATE 75 MG PO TABS
75.0000 mg | ORAL_TABLET | Freq: Every day | ORAL | 3 refills | Status: DC
Start: 2020-03-18 — End: 2021-01-20
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
## 2020-03-18 NOTE — Patient Instructions (Signed)

## 2020-03-19 ENCOUNTER — Telehealth: Payer: Self-pay | Admitting: Gastroenterology

## 2020-03-19 NOTE — Telephone Encounter (Signed)
Tried again to reach the pt and phone line does not seem to be working.

## 2020-03-19 NOTE — Telephone Encounter (Signed)
Tried to return call and the phone line is not working.  I keep getting "number can not go though as dialed" will try later

## 2020-03-19 NOTE — Telephone Encounter (Signed)
Inbound call from patient requesting a call back from a nurse please.  Needs to make a follow-up appointment and informed him next availability for Dr. Ardis Hughs is not until April and does not want to see an APP.

## 2020-03-20 NOTE — Telephone Encounter (Signed)
The pt has been advised that we can put him on a wait list for sooner appt with Dr Ardis Hughs.  He was offered an app appt but declined. He will continue citrucel until appt.

## 2020-03-20 NOTE — Telephone Encounter (Signed)
Pt called again about this message. I made an appt with Dr. Ardis Hughs on 05/14/20 but pt would like to be seen sooner than that. He also wants to know if he needs to continue taking citrucel. Pls call him again. He stated that if they do not recognize the phone number, he does not answer. I asked him to please try to answer because it might be our office.

## 2020-03-25 ENCOUNTER — Other Ambulatory Visit: Payer: Self-pay | Admitting: Family Medicine

## 2020-03-25 DIAGNOSIS — I10 Essential (primary) hypertension: Secondary | ICD-10-CM

## 2020-04-03 ENCOUNTER — Ambulatory Visit: Payer: Medicare HMO | Admitting: Podiatry

## 2020-04-03 ENCOUNTER — Other Ambulatory Visit: Payer: Self-pay

## 2020-04-03 DIAGNOSIS — M79675 Pain in left toe(s): Secondary | ICD-10-CM | POA: Diagnosis not present

## 2020-04-03 DIAGNOSIS — B351 Tinea unguium: Secondary | ICD-10-CM | POA: Diagnosis not present

## 2020-04-03 DIAGNOSIS — M79674 Pain in right toe(s): Secondary | ICD-10-CM | POA: Diagnosis not present

## 2020-04-05 ENCOUNTER — Encounter: Payer: Self-pay | Admitting: Podiatry

## 2020-04-05 NOTE — Progress Notes (Signed)
  Subjective:  Patient ID: Matthew [name], male    DOB: 03/28/1935,  MRN: [id number]  Chief Complaint  Patient presents with  . Nail Problem    Nail trim    85 y.o. male returns for the above complaint.  Patient is here for painful thickened elongated mycotic toenails x10.  Patient states that he has not been able to cut his own toenails because they are gone so dystrophic.  Patient states that he is not able to cut it them.  He denies any other acute complaints  Objective:  There were no vitals filed for this visit. Podiatric Exam: Vascular: dorsalis pedis and posterior tibial pulses are palpable bilateral. Capillary return is immediate. Temperature gradient is WNL. Skin turgor WNL  Sensorium: Normal Semmes Weinstein monofilament test. Normal tactile sensation bilaterally. Nail Exam: Pt has thick disfigured discolored nails with subungual debris noted bilateral entire nail hallux through fifth toenails Ulcer Exam: There is no evidence of ulcer or pre-ulcerative changes or infection. Orthopedic Exam: Muscle tone and strength are WNL. No limitations in general ROM. No crepitus or effusions noted. HAV  B/L.  Hammer toes 2-5  B/L. Skin: No Porokeratosis. No infection or ulcers.  Sweatiness without porokeratosis noted to bilateral lower extremity  Assessment & Plan:  Patient was evaluated and treated and all questions answered.  Onychomycosis with pain  -Nails palliatively debrided as below. -Educated on self-care  Left hallux ingrown nail/dystrophic nail -I discussed with the patient extensive detail about the etiology of ingrown nail and various treatment options were discussed.  I believe patient will benefit from removal of the ingrown toenail however patient would like to hold off for now and will think about it.  We will discuss this at a later time to have it removed.  Hyperhidrosis bilateral plantar feet -I explained to patient the etiology of hyperhidrosis and various treatment  options were extensively discussed.  Given that this is mild to moderate in nature I believe patient will benefit from [employer] foot powder.  I encouraged using it twice a day.  Patient states understanding will do so.   Procedure: Nail Debridement Rationale: pain  Type of Debridement: manual, sharp debridement. Instrumentation: Nail nipper, rotary burr. Number of Nails: 10  Procedures and Treatment: Consent by patient was obtained for treatment procedures. The patient understood the discussion of treatment and procedures well. All questions were answered thoroughly reviewed. Debridement of mycotic and hypertrophic toenails, 1 through 5 bilateral and clearing of subungual debris. No ulceration, no infection noted.  Return Visit-Office Procedure: Patient instructed to return to the office for a follow up visit 3 months for continued evaluation and treatment.  Boneta Lucks, DPM    No follow-ups on file.

## 2020-04-18 DIAGNOSIS — L570 Actinic keratosis: Secondary | ICD-10-CM | POA: Diagnosis not present

## 2020-04-18 DIAGNOSIS — Z85828 Personal history of other malignant neoplasm of skin: Secondary | ICD-10-CM | POA: Diagnosis not present

## 2020-04-18 DIAGNOSIS — D485 Neoplasm of uncertain behavior of skin: Secondary | ICD-10-CM | POA: Diagnosis not present

## 2020-04-20 ENCOUNTER — Other Ambulatory Visit: Payer: Self-pay | Admitting: Cardiology

## 2020-05-13 ENCOUNTER — Other Ambulatory Visit: Payer: Self-pay

## 2020-05-13 ENCOUNTER — Telehealth (INDEPENDENT_AMBULATORY_CARE_PROVIDER_SITE_OTHER): Payer: Medicare HMO | Admitting: Family Medicine

## 2020-05-13 ENCOUNTER — Encounter: Payer: Self-pay | Admitting: Family Medicine

## 2020-05-13 DIAGNOSIS — J309 Allergic rhinitis, unspecified: Secondary | ICD-10-CM | POA: Diagnosis not present

## 2020-05-13 MED ORDER — LEVOCETIRIZINE DIHYDROCHLORIDE 5 MG PO TABS: 5.0000 mg | ORAL_TABLET | Freq: Every evening | ORAL | 2 refills | Status: DC
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
## 2020-05-14 ENCOUNTER — Telehealth: Payer: Self-pay

## 2020-05-14 ENCOUNTER — Ambulatory Visit (INDEPENDENT_AMBULATORY_CARE_PROVIDER_SITE_OTHER): Payer: Medicare HMO | Admitting: Gastroenterology

## 2020-05-14 ENCOUNTER — Encounter: Payer: Self-pay | Admitting: Gastroenterology

## 2020-05-14 DIAGNOSIS — R142 Eructation: Secondary | ICD-10-CM | POA: Diagnosis not present

## 2020-05-14 DIAGNOSIS — R1013 Epigastric pain: Secondary | ICD-10-CM

## 2020-05-14 NOTE — Telephone Encounter (Signed)
Cardiac clearance request for Endoscopy asking to hold Plavix 5 days prior to procedure.   H/o CAD s/p PCI to the LAD and Left Cx in October 2021, HLD, HTN. Cath and stenting in October 2021. Last seen 03/18/20 and ding well from a cardiac perspective. Was having some nosebleeds.  Dr. Harriet Masson, please comment on Plavix and route to [facility] PREOP. Thanks!

## 2020-05-14 NOTE — Patient Instructions (Signed)
If you are age 83 or older, your body mass index should be between 23-30. Your Body mass index is 32.99 kg/m. If this is out of the aforementioned range listed, please consider follow up with your Primary Care Provider.  You have been scheduled for an endoscopy. Please follow written instructions given to you at your visit today. If you use inhalers (even only as needed), please bring them with you on the day of your procedure.  You will be contacted by our office prior to your procedure for directions on holding your Plavix.  If you do not hear from our office 1 week prior to your scheduled procedure, please call [phone] to discuss.   Due to recent changes in healthcare laws, you may see the results of your imaging and laboratory studies on MyChart before your provider has had a chance to review them.  We understand that in some cases there may be results that are confusing or concerning to you. Not all laboratory results come back in the same time frame and the provider may be waiting for multiple results in order to interpret others.  Please give [place] 48 hours in order for your provider to thoroughly review all the results before contacting the office for clarification of your results.   Thank you for entrusting me with your care and choosing [facility].  Dr Ardis Hughs

## 2020-05-14 NOTE — Telephone Encounter (Signed)
[facility] Pre-operative Risk Assessment     Request for surgical clearance:     Endoscopy Procedure  What type of surgery is being performed?     Endoscopy  When is this surgery scheduled?     06-03-2020  What type of clearance is required ?   Pharmacy  Are there any medications that need to be held prior to surgery and how long? Plavix x 5 days  Practice name and name of physician performing surgery?      [facility]  What is your office phone and fax number?      Phone- [phone]  [phone]  Anesthesia type (None, local, MAC, general) ?       MAC
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
## 2020-05-16 ENCOUNTER — Ambulatory Visit (INDEPENDENT_AMBULATORY_CARE_PROVIDER_SITE_OTHER): Payer: Medicare HMO | Admitting: Medical

## 2020-05-16 ENCOUNTER — Other Ambulatory Visit: Payer: Self-pay

## 2020-05-16 VITALS — BP 142/70 | HR 97 | Temp 98.3°F | Resp 18 | Ht 68.0 in | Wt 216.0 lb

## 2020-05-16 DIAGNOSIS — J309 Allergic rhinitis, unspecified: Secondary | ICD-10-CM

## 2020-05-16 DIAGNOSIS — J40 Bronchitis, not specified as acute or chronic: Secondary | ICD-10-CM

## 2020-05-16 MED ORDER — BENZONATATE 100 MG PO CAPS: 100.0000 mg | ORAL_CAPSULE | Freq: Three times a day (TID) | ORAL | 0 refills | Status: DC | PRN

## 2020-05-16 MED ORDER — METHYLPREDNISOLONE 4 MG PO TABS: ORAL_TABLET | ORAL | 0 refills | Status: DC

## 2020-05-16 MED ORDER — CEFDINIR 300 MG PO CAPS: 300.0000 mg | ORAL_CAPSULE | Freq: Two times a day (BID) | ORAL | 0 refills | Status: DC
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
## 2020-05-16 NOTE — Patient Instructions (Addendum)
Persisting allergy signs and symptoms. Also some bronchitis type signs or symptoms.  Continue xyzal and astelin.  Due to severity of symptoms/persiting despite the above will give taper dose of medrol  Also rx cefdnir antibiotic(prior use 3 time no reaction) for bronchitis type symptoms.  If chest congestion or increasing cough let us know and will order xray.  Follow up in 7-10 days or as needed

## 2020-05-16 NOTE — Addendum Note (Signed)
Addended by: Anabel Halon on: 05/16/2020 09:17 AM   Modules accepted: Orders

## 2020-05-28 ENCOUNTER — Telehealth: Payer: Self-pay | Admitting: Gastroenterology

## 2020-05-28 NOTE — Telephone Encounter (Signed)
Hay fever, seasonal allergies should not keep him from endoscopic testing unless he is miserable from his symptoms and wants to reschedule

## 2020-05-28 NOTE — Telephone Encounter (Signed)
Patient aware that hay fever, seasonal allergies should not keep him from endoscopic testing unless he is miserable from his symptoms.  Patient agreed to proceed.  Patient aware that Plavix clearance has been sent to cardiology and once we have been notified of clearance we will make patient aware.  Patient agreed to plan and verbalized understanding.  No further questions.

## 2020-05-28 NOTE — Telephone Encounter (Signed)
Patient called states him and his wife have hay fever and went to get some antibiotics he started them last Thursday and now he is wondering if he would need to reschedule his colonoscopy. He requested you leave him a detailed message because they will be out. Patient also stated he is on Plavix and someone is suppose to contact his cardiologist for clearance and advise him on when to stop the medication.

## 2020-05-29 ENCOUNTER — Telehealth: Payer: Self-pay | Admitting: Cardiology

## 2020-05-29 NOTE — Telephone Encounter (Signed)
Does this pt have everything he needs for his procedure? Have a great day! Matthew [name]

## 2020-05-29 NOTE — Telephone Encounter (Signed)
Patient advised that he has been given clearance to hold Plavix 5 days prior to colonoscopy per verbal order by Dr Tobb to Hayley, RN. Colonoscopy is scheduled for 06-11-2020.  Patient advised to take last dose of Plavix on 06-05-2020, and he will be advised when to restart Plavix by Dr Jacobs after the procedure.  Patient agreed to plan and verbalized understanding.  No further questions.  

## 2020-05-29 NOTE — Telephone Encounter (Signed)
Can you help with this?

## 2020-05-29 NOTE — Telephone Encounter (Signed)
Matthew [name] is calling stating Dr. Ardis [name] has been sending surgical clearance request for an endoscopy procedure he is scheduled to have performed 06/11/20. He states their office contacted him and advised they have not heard back from our office in regards to it. Dr. Ardis [name] is refaxing the clearance request for the third time. Matthew [name] is requesting a callback to confirm it is received so he can be cleared in time for his procedure. He states he will most likely not answer when calling back unless he recognizes the number, due to the amount of telemarketers that call. If he does not answer he requested a detailed message be left so he can callback if he has further questions. Please advise.

## 2020-05-29 NOTE — Telephone Encounter (Signed)
Patient advised that he has been given clearance to hold Plavix 5 days prior to colonoscopy per verbal order by Dr Harriet Masson to [facility], [place]. Colonoscopy is scheduled for 06-11-2020.  Patient advised to take last dose of Plavix on 06-05-2020, and he will be advised when to restart Plavix by Dr Ardis Hughs after the procedure.  Patient agreed to plan and verbalized understanding.  No further questions.

## 2020-06-03 ENCOUNTER — Encounter: Payer: Medicare HMO | Admitting: Gastroenterology

## 2020-06-04 ENCOUNTER — Encounter: Payer: Self-pay | Admitting: Family Medicine

## 2020-06-04 ENCOUNTER — Telehealth: Payer: Self-pay | Admitting: Cardiology

## 2020-06-04 ENCOUNTER — Ambulatory Visit (INDEPENDENT_AMBULATORY_CARE_PROVIDER_SITE_OTHER): Payer: Medicare HMO | Admitting: Family Medicine

## 2020-06-04 ENCOUNTER — Other Ambulatory Visit: Payer: Self-pay

## 2020-06-04 VITALS — BP 124/66 | HR 72 | Temp 98.5°F | Resp 18 | Ht 68.0 in | Wt 214.0 lb

## 2020-06-04 DIAGNOSIS — R739 Hyperglycemia, unspecified: Secondary | ICD-10-CM | POA: Diagnosis not present

## 2020-06-04 DIAGNOSIS — Z0000 Encounter for general adult medical examination without abnormal findings: Secondary | ICD-10-CM

## 2020-06-04 DIAGNOSIS — I2510 Atherosclerotic heart disease of native coronary artery without angina pectoris: Secondary | ICD-10-CM

## 2020-06-04 DIAGNOSIS — Z86010 Personal history of colonic polyps: Secondary | ICD-10-CM

## 2020-06-04 DIAGNOSIS — I10 Essential (primary) hypertension: Secondary | ICD-10-CM

## 2020-06-04 DIAGNOSIS — E785 Hyperlipidemia, unspecified: Secondary | ICD-10-CM | POA: Diagnosis not present

## 2020-06-04 DIAGNOSIS — E669 Obesity, unspecified: Secondary | ICD-10-CM

## 2020-06-04 DIAGNOSIS — K219 Gastro-esophageal reflux disease without esophagitis: Secondary | ICD-10-CM

## 2020-06-04 DIAGNOSIS — J309 Allergic rhinitis, unspecified: Secondary | ICD-10-CM

## 2020-06-04 LAB — CBC
HCT: 37.9 % — ABNORMAL LOW (ref 39.0–52.0)
Hemoglobin: 13 g/dL (ref 13.0–17.0)
MCHC: 34.4 g/dL (ref 30.0–36.0)
MCV: 87.5 fl (ref 78.0–100.0)
Platelets: 216 10*3/uL (ref 150.0–400.0)
RBC: 4.33 Mil/uL (ref 4.22–5.81)
RDW: 13.6 % (ref 11.5–15.5)
WBC: 9.9 10*3/uL (ref 4.0–10.5)

## 2020-06-04 LAB — LIPID PANEL
Cholesterol: 171 mg/dL (ref 0–200)
HDL: 44.9 mg/dL (ref >39.00)
LDL Cholesterol: 112 mg/dL — ABNORMAL HIGH (ref 0–99)
NonHDL: 126.38
Total CHOL/HDL Ratio: 4
Triglycerides: 70 mg/dL (ref 0.0–149.0)
VLDL: 14 mg/dL (ref 0.0–40.0)

## 2020-06-04 LAB — COMPREHENSIVE METABOLIC PANEL
ALT: 16 U/L (ref 0–53)
AST: 19 U/L (ref 0–37)
Albumin: 4.1 g/dL (ref 3.5–5.2)
Alkaline Phosphatase: 49 U/L (ref 39–117)
BUN: 14 mg/dL (ref 6–23)
CO2: 28 mEq/L (ref 19–32)
Calcium: 9.7 mg/dL (ref 8.4–10.5)
Chloride: 96 mEq/L (ref 96–112)
Creatinine, Ser: 1.07 mg/dL (ref 0.40–1.50)
GFR: 64.48 mL/min (ref >60.00)
Glucose, Bld: 104 mg/dL — ABNORMAL HIGH (ref 70–99)
Potassium: 4.8 mEq/L (ref 3.5–5.1)
Sodium: 130 mEq/L — ABNORMAL LOW (ref 135–145)
Total Bilirubin: 0.5 mg/dL (ref 0.2–1.2)
Total Protein: 6.7 g/dL (ref 6.0–8.3)

## 2020-06-04 LAB — HEMOGLOBIN A1C: Hgb A1c MFr Bld: 6.6 % — ABNORMAL HIGH (ref 4.6–6.5)

## 2020-06-04 LAB — TSH: TSH: 1.67 u[IU]/mL (ref 0.35–4.50)

## 2020-06-04 MED ORDER — BENZONATATE 100 MG PO CAPS: 100.0000 mg | ORAL_CAPSULE | Freq: Three times a day (TID) | ORAL | 0 refills | Status: DC | PRN

## 2020-06-04 MED ORDER — MONTELUKAST SODIUM 10 MG PO TABS: 10.0000 mg | ORAL_TABLET | Freq: Every evening | ORAL | 3 refills | Status: DC | PRN

## 2020-06-04 MED ORDER — LEVOCETIRIZINE DIHYDROCHLORIDE 5 MG PO TABS: 5.0000 mg | ORAL_TABLET | Freq: Every evening | ORAL | 1 refills | Status: DC
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
## 2020-06-04 NOTE — Assessment & Plan Note (Signed)
Well controlled, no changes to meds. Encouraged heart healthy diet such as the DASH diet and exercise as tolerated.  °

## 2020-06-04 NOTE — Assessment & Plan Note (Signed)
Controlled on Famotidine 40 mg bid but if he misses a dose his symptoms return. He has a repeat endoscopy with gastroenterology soon. Avoid offending foods, start probiotics. Do not eat large meals in late evening and consider raising head of bed.

## 2020-06-04 NOTE — Telephone Encounter (Signed)
Called patient. He reports he woke up this morning at 230 am to blow nose. When he did his nose was "pouring blood out" og right nostril. It is not longer actively bleeding he is unaware of clots but is scared to blow nose again. He believes the nose bleed is from pravastatin and he states he will not be taking a statin anymore due to this. He states he was on a statin years ago and he had nosebleeds then too. He will not continue a statin. He does take plavix 75 mg daily. He will be stopping the day after tomorrow to prepare for a surgical procedure. Patient states if there is something else besides a statin that Dr. Harriet Masson would like to recommend he will consider it. Will consult with Dr. Harriet Masson.

## 2020-06-04 NOTE — Assessment & Plan Note (Signed)
hgba1c acceptable, minimize simple carbs. Increase exercise as tolerated.  

## 2020-06-04 NOTE — Telephone Encounter (Signed)
Left message for patient to return call.

## 2020-06-04 NOTE — Assessment & Plan Note (Signed)
Encouraged DASH or MIND diet, decrease po intake and increase exercise as tolerated. Needs 7-8 hours of sleep nightly. Avoid trans fats, eat small, frequent meals every 4-5 hours with lean proteins, complex carbs and healthy fats. Minimize simple carbs, GMO foods. 

## 2020-06-04 NOTE — Assessment & Plan Note (Signed)
Encouraged heart healthy diet, increase exercise, avoid trans fats, consider a krill oil cap daily 

## 2020-06-04 NOTE — Assessment & Plan Note (Signed)
Asymptomatic and over 82. Can discuss with gastroenterology but has essentially aged out of colonoscopy for screening purposes.

## 2020-06-04 NOTE — Telephone Encounter (Signed)
Spoke to the patient just now and he let me know that he had a very bad nose blood last night. He states that several years ago he was prescribed a statin from another provider. He is now concerned that his nose bleeds are being caused by his pravastatin. He is wanting to know if he can take an alternative medication that is not a statin.   I will route to Dr. Harriet Masson at this time.

## 2020-06-04 NOTE — Telephone Encounter (Signed)
Patient mentioned that he had a bout with nose bleeding last night. Stated that he refuses to take a statin and and wants an alternative to the medication. Please call back

## 2020-06-04 NOTE — Patient Instructions (Signed)
Preventive Care 65 Years and Older, Male Preventive care refers to lifestyle choices and visits with your health care provider that can promote health and wellness. This includes:  A yearly physical exam. This is also called an annual wellness visit.  Regular dental and eye exams.  Immunizations.  Screening for certain conditions.  Healthy lifestyle choices, such as: ? Eating a healthy diet. ? Getting regular exercise. ? Not using drugs or products that contain nicotine and tobacco. ? Limiting alcohol use. What can I expect for my preventive care visit? Physical exam Your health care provider will check your:  Height and weight. These may be used to calculate your BMI (body mass index). BMI is a measurement that tells if you are at a healthy weight.  Heart rate and blood pressure.  Body temperature.  Skin for abnormal spots. Counseling Your health care provider may ask you questions about your:  Past medical problems.  Family's medical history.  Alcohol, tobacco, and drug use.  Emotional well-being.  Home life and relationship well-being.  Sexual activity.  Diet, exercise, and sleep habits.  History of falls.  Memory and ability to understand (cognition).  Work and work environment.  Access to firearms. What immunizations do I need? Vaccines are usually given at various ages, according to a schedule. Your health care provider will recommend vaccines for you based on your age, medical history, and lifestyle or other factors, such as travel or where you work.   What tests do I need? Blood tests  Lipid and cholesterol levels. These may be checked every 5 years, or more often depending on your overall health.  Hepatitis C test.  Hepatitis B test. Screening  Lung cancer screening. You may have this screening every year starting at age 55 if you have a 30-pack-year history of smoking and currently smoke or have quit within the past 15 years.  Colorectal  cancer screening. ? All adults should have this screening starting at age 50 and continuing until age 75. ? Your health care provider may recommend screening at age 45 if you are at increased risk. ? You will have tests every 1-10 years, depending on your results and the type of screening test.  Prostate cancer screening. Recommendations will vary depending on your family history and other risks.  Genital exam to check for testicular cancer or hernias.  Diabetes screening. ? This is done by checking your blood sugar (glucose) after you have not eaten for a while (fasting). ? You may have this done every 1-3 years.  Abdominal aortic aneurysm (AAA) screening. You may need this if you are a current or former smoker.  STD (sexually transmitted disease) testing, if you are at risk. Follow these instructions at home: Eating and drinking  Eat a diet that includes fresh fruits and vegetables, whole grains, lean protein, and low-fat dairy products. Limit your intake of foods with high amounts of sugar, saturated fats, and salt.  Take vitamin and mineral supplements as recommended by your health care provider.  Do not drink alcohol if your health care provider tells you not to drink.  If you drink alcohol: ? Limit how much you have to 0-2 drinks a day. ? Be aware of how much alcohol is in your drink. In the [place] one drink equals one 12 oz bottle of beer (355 mL), one 5 oz glass of wine (148 mL), or one 1 oz glass of hard liquor (44 mL).   Lifestyle  Take daily care of your teeth   and gums. Brush your teeth every morning and night with fluoride toothpaste. Floss one time each day.  Stay active. Exercise for at least 30 minutes 5 or more days each week.  Do not use any products that contain nicotine or tobacco, such as cigarettes, e-cigarettes, and chewing tobacco. If you need help quitting, ask your health care provider.  Do not use drugs.  If you are sexually active, practice safe sex.  Use a condom or other form of protection to prevent STIs (sexually transmitted infections).  Talk with your health care provider about taking a low-dose aspirin or statin.  Find healthy ways to cope with stress, such as: ? Meditation, yoga, or listening to music. ? Journaling. ? Talking to a trusted person. ? Spending time with friends and family. Safety  Always wear your seat belt while driving or riding in a vehicle.  Do not drive: ? If you have been drinking alcohol. Do not ride with someone who has been drinking. ? When you are tired or distracted. ? While texting.  Wear a helmet and other protective equipment during sports activities.  If you have firearms in your house, make sure you follow all gun safety procedures. What's next?  Visit your health care provider once a year for an annual wellness visit.  Ask your health care provider how often you should have your eyes and teeth checked.  Stay up to date on all vaccines. This information is not intended to replace advice given to you by your health care provider. Make sure you discuss any questions you have with your health care provider. Document Revised: 10/18/2018 Document Reviewed: 01/13/2018 Elsevier Patient Education  2021 [employer]vier Inc.  

## 2020-06-04 NOTE — Telephone Encounter (Signed)
Please set him up with the lipid clinic. Also let the patient know that it would be beneficial for him to make another appointment to see ENT if this continues to happen.  I know he did see Dr. Lucia Gaskins back in January.

## 2020-06-04 NOTE — Telephone Encounter (Signed)
Pt c/o medication issue:  1. Name of Medication: Pravastatin  2. How are you currently taking this medication (dosage and times per day)? 1 time a day  3. Are you having a reaction (difficulty breathing--STAT)?yes- no difficulty breathing  4. What is your medication issue? Pouring Nose Blee he is no longer going to take it- he can not taking a Statin    He says maybe  Dr Harriet Masson can prescribe something else

## 2020-06-04 NOTE — Assessment & Plan Note (Signed)
Patient encouraged to maintain heart healthy diet, regular exercise, adequate sleep. Consider daily probiotics. Take medications as prescribed. Labs ordered and reviewed. Has aged out of colonoscopy screening. Immunizations UTD will get copy of his second COVID booster from [employer].

## 2020-06-04 NOTE — Telephone Encounter (Signed)
We have a similar message yesterday from the patient.  I referred him to our lipid clinic to discuss option for PCSK9 inhibitors.  I also advised the patient that he needed to see his ENT doctor as well.

## 2020-06-05 NOTE — Addendum Note (Signed)
Addended by: Resa Miner I on: 06/05/2020 10:06 AM   Modules accepted: Orders

## 2020-06-05 NOTE — Telephone Encounter (Signed)
Left message on patients voicemail to please return our call.   

## 2020-06-05 NOTE — Telephone Encounter (Signed)
Spoke to the patient just now and let him know Dr. Tobb's recommendations. He verbalizes understanding and thanks me for the call back.  °

## 2020-06-06 NOTE — Telephone Encounter (Signed)
Patient states the bleeding has stopped entirely. He states this happened a few years ago. He states he was on the statin for a year and was fine and then all of a sudden started getting nose bleeds. He is scheduled to see the lipid clinic 06/24/2020.

## 2020-06-11 ENCOUNTER — Ambulatory Visit (AMBULATORY_SURGERY_CENTER): Payer: Medicare HMO | Admitting: Gastroenterology

## 2020-06-11 ENCOUNTER — Encounter: Payer: Self-pay | Admitting: Gastroenterology

## 2020-06-11 ENCOUNTER — Other Ambulatory Visit: Payer: Self-pay

## 2020-06-11 ENCOUNTER — Other Ambulatory Visit: Payer: Self-pay | Admitting: Gastroenterology

## 2020-06-11 VITALS — BP 111/60 | HR 61 | Temp 97.3°F | Resp 15 | Ht 68.0 in | Wt 217.0 lb

## 2020-06-11 DIAGNOSIS — K317 Polyp of stomach and duodenum: Secondary | ICD-10-CM | POA: Diagnosis not present

## 2020-06-11 DIAGNOSIS — K2950 Unspecified chronic gastritis without bleeding: Secondary | ICD-10-CM | POA: Diagnosis not present

## 2020-06-11 DIAGNOSIS — R1013 Epigastric pain: Secondary | ICD-10-CM | POA: Diagnosis not present

## 2020-06-11 DIAGNOSIS — K2970 Gastritis, unspecified, without bleeding: Secondary | ICD-10-CM

## 2020-06-11 DIAGNOSIS — K449 Diaphragmatic hernia without obstruction or gangrene: Secondary | ICD-10-CM | POA: Diagnosis not present

## 2020-06-11 MED ORDER — SODIUM CHLORIDE 0.9 % IV SOLN: 500.0000 mL | INTRAVENOUS | Status: DC

## 2020-06-11 NOTE — Progress Notes (Signed)
Called to room to assist during endoscopic procedure.  Patient ID and intended procedure confirmed with present staff. Received instructions for my participation in the procedure from the performing physician.  

## 2020-06-11 NOTE — Op Note (Signed)
[facility] Patient Name: Zyad Boomer Procedure Date: 06/11/2020 9:50 AM MRN: [id number] Endoscopist: Milus Banister , MD Age: 85 Referring MD:  Date of Birth: 11/15/1935 Gender: Male Account #: [id number] Procedure:                Upper GI endoscopy Indications:              Dyspepsia, belching, abnormal barium esophagram Medicines:                Monitored Anesthesia Care Procedure:                Pre-Anesthesia Assessment:                           - Prior to the procedure, a History and Physical                            was performed, and patient medications and                            allergies were reviewed. The patient's tolerance of                            previous anesthesia was also reviewed. The risks                            and benefits of the procedure and the sedation                            options and risks were discussed with the patient.                            All questions were answered, and informed consent                            was obtained. Prior Anticoagulants: The patient has                            taken Plavix (clopidogrel), last dose was 5 days                            prior to procedure. ASA Grade Assessment: III - A                            patient with severe systemic disease. After                            reviewing the risks and benefits, the patient was                            deemed in satisfactory condition to undergo the                            procedure.  After obtaining informed consent, the endoscope was                            passed under direct vision. Throughout the                            procedure, the patient's blood pressure, pulse, and                            oxygen saturations were monitored continuously. The                            Endoscope was introduced through the mouth, and                            advanced to the second part of duodenum. The  upper                            GI endoscopy was accomplished without difficulty.                            The patient tolerated the procedure well. Scope In: Scope Out: Findings:                 A small hiatal hernia was present.                           Mild inflammation characterized by friability and                            granularity was found in the gastric antrum.                            Biopsies were taken with a cold forceps for                            histology.                           Several small (3-90mm) classic appearing fundic                            gland polyps in the proximal and mid stomach.                           The exam was otherwise without abnormality. Complications:            No immediate complications. Estimated blood loss:                            None. Estimated Blood Loss:     Estimated blood loss: none. Impression:               - Small hiatal hernia.                           -  Mild, non-specific gastritis. Biopsied.                           - Several small (3-39mm) classic appearing fundic                            gland polyps in the proximal and mid stomach.                           - The examination was otherwise normal. Recommendation:           - Patient has a contact number available for                            emergencies. The signs and symptoms of potential                            delayed complications were discussed with the                            patient. Return to normal activities tomorrow.                            Written discharge instructions were provided to the                            patient.                           - Resume previous diet.                           - Continue present medications. You can resume your                            plavix today.                           - Await pathology results. Milus Banister, MD 06/11/2020 10:03:33 AM This report has been signed  electronically.

## 2020-06-11 NOTE — Patient Instructions (Signed)
Discharge instructions given. Handout on a hiatal hernia and Gastritis. Resume [previous medications. Resume Plavix today. YOU HAD AN ENDOSCOPIC PROCEDURE TODAY AT [facility]:   Refer to the procedure report that was given to you for any specific questions about what was found during the examination.  If the procedure report does not answer your questions, please call your gastroenterologist to clarify.  If you requested that your care partner not be given the details of your procedure findings, then the procedure report has been included in a sealed envelope for you to review at your convenience later.  YOU SHOULD EXPECT: Some feelings of bloating in the abdomen. Passage of more gas than usual.  Walking can help get rid of the air that was put into your GI tract during the procedure and reduce the bloating. If you had a lower endoscopy (such as a colonoscopy or flexible sigmoidoscopy) you may notice spotting of blood in your stool or on the toilet paper. If you underwent a bowel prep for your procedure, you may not have a normal bowel movement for a few days.  Please Note:  You might notice some irritation and congestion in your nose or some drainage.  This is from the oxygen used during your procedure.  There is no need for concern and it should clear up in a day or so.  SYMPTOMS TO REPORT IMMEDIATELY:    Following upper endoscopy (EGD)  Vomiting of blood or coffee ground material  New chest pain or pain under the shoulder blades  Painful or persistently difficult swallowing  New shortness of breath  Fever of 100F or higher  Black, tarry-looking stools  For urgent or emergent issues, a gastroenterologist can be reached at any hour by calling [phone]. Do not use MyChart messaging for urgent concerns.    DIET:  We do recommend a small meal at first, but then you may proceed to your regular diet.  Drink plenty of fluids but you should avoid alcoholic beverages for  24 hours.  ACTIVITY:  You should plan to take it easy for the rest of today and you should NOT DRIVE or use heavy machinery until tomorrow (because of the sedation medicines used during the test).    FOLLOW UP: Our staff will call the number listed on your records 48-72 hours following your procedure to check on you and address any questions or concerns that you may have regarding the information given to you following your procedure. If we do not reach you, we will leave a message.  We will attempt to reach you two times.  During this call, we will ask if you have developed any symptoms of COVID 19. If you develop any symptoms (ie: fever, flu-like symptoms, shortness of breath, cough etc.) before then, please call [phone].  If you test positive for Covid 19 in the 2 weeks post procedure, please call and report this information to [place].    If any biopsies were taken you will be contacted by phone or by letter within the next 1-3 weeks.  Please call us at [phone] if you have not heard about the biopsies in 3 weeks.    SIGNATURES/CONFIDENTIALITY: You and/or your care partner have signed paperwork which will be entered into your electronic medical record.  These signatures attest to the fact that that the information above on your After Visit Summary has been reviewed and is understood.  Full responsibility of the confidentiality of this discharge information lies with you  and/or your care-partner. 

## 2020-06-11 NOTE — Progress Notes (Signed)
VS by CW. ?

## 2020-06-11 NOTE — Progress Notes (Signed)
PT taken to PACU. Monitors in place. VSS. Report given to RN. 

## 2020-06-13 ENCOUNTER — Telehealth: Payer: Self-pay | Admitting: *Deleted

## 2020-06-13 DIAGNOSIS — N393 Stress incontinence (female) (male): Secondary | ICD-10-CM | POA: Diagnosis not present

## 2020-06-13 DIAGNOSIS — N529 Male erectile dysfunction, unspecified: Secondary | ICD-10-CM | POA: Diagnosis not present

## 2020-06-13 DIAGNOSIS — C61 Malignant neoplasm of prostate: Secondary | ICD-10-CM | POA: Diagnosis not present

## 2020-06-13 NOTE — Telephone Encounter (Signed)
  Follow up Call-  Call back number 06/11/2020  Post procedure Call Back phone  # [phone]  Permission to leave phone message Yes  Some recent data might be hidden     Patient questions:  Do you have a fever, pain , or abdominal swelling? No. Pain Score  0 *  Have you tolerated food without any problems? Yes.    Have you been able to return to your normal activities? Yes.    Do you have any questions about your discharge instructions: Diet   No. Medications  No. Follow up visit  No.  Do you have questions or concerns about your Care? No.  Actions: * If pain score is 4 or above: 1. No action needed, pain <4.Have you developed a fever since your procedure? no  2.   Have you had an respiratory symptoms (SOB or cough) since your procedure? no  3.   Have you tested positive for COVID 19 since your procedure no  4.   Have you had any family members/close contacts diagnosed with the COVID 19 since your procedure?  no   If yes to any of these questions please route to Joylene John, RN and Joella Prince, RN

## 2020-06-13 NOTE — Telephone Encounter (Signed)
Follow up call made. 

## 2020-06-18 DIAGNOSIS — Z760 Encounter for issue of repeat prescription: Secondary | ICD-10-CM | POA: Diagnosis not present

## 2020-06-18 DIAGNOSIS — T7840XS Allergy, unspecified, sequela: Secondary | ICD-10-CM | POA: Diagnosis not present

## 2020-06-18 DIAGNOSIS — I10 Essential (primary) hypertension: Secondary | ICD-10-CM | POA: Insufficient documentation

## 2020-06-20 ENCOUNTER — Telehealth: Payer: Self-pay

## 2020-06-20 NOTE — Telephone Encounter (Signed)
Usually give it a couple of weeks. See if he is using flonase and see if he is willing to add. If he was seen at [facility] is he feeling some better, we can consider adding an antibiotic if he is still feeling badly.

## 2020-06-20 NOTE — Telephone Encounter (Signed)
Pt called this morning stating that he was prescribed some allergy medication ( Singulair and XYZAL) on 06/04/2020 and it it not helping he is still having drainage and thick yellow mucous, he asked how long will he need to to take the medication to see a difference or if his medication needs to be changed? -JMA

## 2020-06-20 NOTE — Telephone Encounter (Signed)
Looks like he was seen by the provider at [facility] (see care everywhere).

## 2020-06-21 ENCOUNTER — Other Ambulatory Visit: Payer: Self-pay

## 2020-06-21 ENCOUNTER — Telehealth: Payer: Self-pay | Admitting: Gastroenterology

## 2020-06-21 ENCOUNTER — Other Ambulatory Visit: Payer: Self-pay | Admitting: *Deleted

## 2020-06-21 DIAGNOSIS — E871 Hypo-osmolality and hyponatremia: Secondary | ICD-10-CM

## 2020-06-21 MED ORDER — FLUTICASONE PROPIONATE 50 MCG/ACT NA SUSP: 2.0000 | Freq: Every day | NASAL | 6 refills | Status: DC

## 2020-06-21 NOTE — Telephone Encounter (Signed)
Spoke with pt and he would like the flonase sent in. Medication sent

## 2020-06-21 NOTE — Telephone Encounter (Signed)
Patient called requesting path results also asked that when we call if they  Do not answer to please leave a detailed message.

## 2020-06-21 NOTE — Telephone Encounter (Signed)
Dr Ardis Hughs have you seen path results??

## 2020-06-23 NOTE — Telephone Encounter (Signed)
The path is not back yet but really should be, the egd was on 5/10.  I'll add Megan from [facility] to this.

## 2020-06-24 ENCOUNTER — Telehealth: Payer: Self-pay | Admitting: Pharmacist

## 2020-06-24 ENCOUNTER — Other Ambulatory Visit: Payer: Medicare HMO

## 2020-06-24 ENCOUNTER — Ambulatory Visit (INDEPENDENT_AMBULATORY_CARE_PROVIDER_SITE_OTHER): Payer: Medicare HMO | Admitting: Pharmacist

## 2020-06-24 ENCOUNTER — Encounter: Payer: Self-pay | Admitting: Pharmacist

## 2020-06-24 ENCOUNTER — Other Ambulatory Visit: Payer: Self-pay

## 2020-06-24 DIAGNOSIS — T466X5A Adverse effect of antihyperlipidemic and antiarteriosclerotic drugs, initial encounter: Secondary | ICD-10-CM | POA: Diagnosis not present

## 2020-06-24 DIAGNOSIS — E785 Hyperlipidemia, unspecified: Secondary | ICD-10-CM

## 2020-06-24 DIAGNOSIS — I2510 Atherosclerotic heart disease of native coronary artery without angina pectoris: Secondary | ICD-10-CM | POA: Diagnosis not present

## 2020-06-24 DIAGNOSIS — M791 Myalgia, unspecified site: Secondary | ICD-10-CM | POA: Insufficient documentation

## 2020-06-24 MED ORDER — REPATHA SURECLICK 140 MG/ML ~~LOC~~ SOAJ: 2.0000 mL | SUBCUTANEOUS | 11 refills | Status: DC
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
## 2020-06-24 NOTE — Patient Instructions (Signed)
It was nice meeting you today!  We would like your LDL (bad cholesterol) to be less than 70  Continue your ezetimibe 10mg  once daily and your fenofibrate 134mg  once daily  We will start you on a new medication called Repatha which you will inject once every 2 weeks  I will complete the prior authorization for you and call you when it is approved  We will update your cholesterol panel about 2-3 months after you start the medication  Karren Cobble, PharmD, BCACP, [place], [place] [address], [place], [place] [address] Phone: [phone]; Fax: [phone] 06/24/2020 9:31 AM

## 2020-06-24 NOTE — Telephone Encounter (Signed)
Ok, looping in [place].    Here's another quite delayed [place] path.  FYI

## 2020-06-24 NOTE — Telephone Encounter (Signed)
PA for Repatha approved through 12/21/2020.  Called patient and left message on machine.  Rx sent to pharmacy.

## 2020-06-24 NOTE — Telephone Encounter (Signed)
Called Katie at [facility] and spoke with her about this patients pathology. She did say that a doctor was working on this but she would give him a call and let him know that the patient was calling about his results.

## 2020-06-25 NOTE — Telephone Encounter (Addendum)
Patient called back and was unclear when he should start [place].  Recommended once he picks medication up he can start it whenever it is convenient for him.   Patient's copay is 45 dollars per month

## 2020-06-25 NOTE — Telephone Encounter (Signed)
Felder is calling requesting to speak with [name] due to receiving his VM from yesterday. He states he would like to discus the cost of the [place] and when he should take it once he gets the medication, since he has not yet picked it up. If [name] does not receive and answer when calling back he would like a detailed message to be left. Please advise.

## 2020-06-26 ENCOUNTER — Other Ambulatory Visit (INDEPENDENT_AMBULATORY_CARE_PROVIDER_SITE_OTHER): Payer: Medicare HMO

## 2020-06-26 ENCOUNTER — Other Ambulatory Visit: Payer: Medicare HMO

## 2020-06-26 ENCOUNTER — Other Ambulatory Visit: Payer: Self-pay

## 2020-06-26 DIAGNOSIS — R0789 Other chest pain: Secondary | ICD-10-CM

## 2020-06-26 DIAGNOSIS — E871 Hypo-osmolality and hyponatremia: Secondary | ICD-10-CM

## 2020-06-26 DIAGNOSIS — E785 Hyperlipidemia, unspecified: Secondary | ICD-10-CM

## 2020-06-26 DIAGNOSIS — I2510 Atherosclerotic heart disease of native coronary artery without angina pectoris: Secondary | ICD-10-CM

## 2020-06-27 LAB — COMPREHENSIVE METABOLIC PANEL
ALT: 16 U/L (ref 0–53)
AST: 20 U/L (ref 0–37)
Albumin: 4.1 g/dL (ref 3.5–5.2)
Alkaline Phosphatase: 45 U/L (ref 39–117)
BUN: 16 mg/dL (ref 6–23)
CO2: 24 mEq/L (ref 19–32)
Calcium: 9.6 mg/dL (ref 8.4–10.5)
Chloride: 99 mEq/L (ref 96–112)
Creatinine, Ser: 1.07 mg/dL (ref 0.40–1.50)
GFR: 64.45 mL/min (ref >60.00)
Glucose, Bld: 140 mg/dL — ABNORMAL HIGH (ref 70–99)
Potassium: 4 mEq/L (ref 3.5–5.1)
Sodium: 131 mEq/L — ABNORMAL LOW (ref 135–145)
Total Bilirubin: 0.5 mg/dL (ref 0.2–1.2)
Total Protein: 6.4 g/dL (ref 6.0–8.3)

## 2020-07-03 ENCOUNTER — Telehealth: Payer: Self-pay | Admitting: Family Medicine

## 2020-07-03 NOTE — Telephone Encounter (Signed)
Patient wants to know if he could take allegra

## 2020-07-03 NOTE — Telephone Encounter (Signed)
Yes OTC Allegra 180 mg tabs, 1 tab po daily is fine.

## 2020-07-03 NOTE — Telephone Encounter (Signed)
See below. I want to say yes due to his labs look good but I don't want to make that call

## 2020-07-03 NOTE — Telephone Encounter (Signed)
Pt aware.

## 2020-07-05 ENCOUNTER — Ambulatory Visit (INDEPENDENT_AMBULATORY_CARE_PROVIDER_SITE_OTHER): Payer: Medicare HMO | Admitting: Podiatry

## 2020-07-05 ENCOUNTER — Other Ambulatory Visit: Payer: Self-pay

## 2020-07-05 DIAGNOSIS — L603 Nail dystrophy: Secondary | ICD-10-CM | POA: Diagnosis not present

## 2020-07-05 DIAGNOSIS — L600 Ingrowing nail: Secondary | ICD-10-CM

## 2020-07-09 ENCOUNTER — Encounter: Payer: Self-pay | Admitting: Podiatry

## 2020-07-09 ENCOUNTER — Telehealth: Payer: Self-pay | Admitting: Cardiology
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
## 2020-07-09 NOTE — Telephone Encounter (Signed)
Patient wanted to let me know he took his first injection yesterday without incident.  Will complete repeat lipid panel in 2-3 months

## 2020-07-09 NOTE — Telephone Encounter (Signed)
Patient call to let the pharmaist know that he start on yesterday with Evolocumab (REPATHA SURECLICK) 618 MG/ML SOAJ and he will take the 2nd one the 3 week of the month. He stated that it was very successful. Please advise.

## 2020-07-13 ENCOUNTER — Other Ambulatory Visit: Payer: Self-pay | Admitting: Cardiology

## 2020-07-15 NOTE — Telephone Encounter (Signed)
Metoprolol Succinate 25 mg # 45 x 3 refills and Isosorbide Mononitrate ER 30 mg # 45 x 3 refills sent to pharmacy

## 2020-08-07 ENCOUNTER — Ambulatory Visit: Payer: Medicare HMO | Admitting: Podiatry

## 2020-08-19 ENCOUNTER — Telehealth: Payer: Self-pay | Admitting: Pharmacist

## 2020-08-19 NOTE — Telephone Encounter (Signed)
Patient called and stated that when he went to inject repatha today, the pen clicked but the view finder did not turn yellow. Medicine did not inject. Gave patient the phone # for [employer] to request a replacement an offered him to do his next injection in office if he would like. I think he is taking pressure off the pen and the yellow ring is popping back out and locking the pen,

## 2020-08-25 ENCOUNTER — Other Ambulatory Visit: Payer: Self-pay | Admitting: Cardiology

## 2020-08-25 ENCOUNTER — Other Ambulatory Visit: Payer: Self-pay | Admitting: Family Medicine

## 2020-08-25 DIAGNOSIS — I10 Essential (primary) hypertension: Secondary | ICD-10-CM

## 2020-08-26 NOTE — Telephone Encounter (Signed)
Pharmacy notified Metoprolol was a onetime fill for CT.

## 2020-09-02 ENCOUNTER — Telehealth: Payer: Self-pay | Admitting: Cardiology

## 2020-09-02 NOTE — Telephone Encounter (Signed)
Message addressed.

## 2020-09-02 NOTE — Telephone Encounter (Signed)
Tried calling patient. No answer and no voicemail set up for me to leave a message. 

## 2020-09-02 NOTE — Telephone Encounter (Signed)
New Message:     Patient said he received a refill on Pantoprazole from [facility]. He said he have never taken this medicine before. He wants to know if he is supposed to take this? If he does not answer the phone, please just leave him a message telling him whether he needs to take it or not.

## 2020-09-04 ENCOUNTER — Encounter: Payer: Self-pay | Admitting: Pharmacist

## 2020-09-04 ENCOUNTER — Telehealth: Payer: Self-pay | Admitting: Pharmacist

## 2020-09-04 MED ORDER — REPATHA SURECLICK 140 MG/ML ~~LOC~~ SOAJ: 1.0000 "pen " | SUBCUTANEOUS | 0 refills | Status: DC

## 2020-09-04 NOTE — Telephone Encounter (Signed)
Patient called stating his Repatha pen did not work right. Did not turn yellow. Patient requested to come in and do injection in office. Patient came and successfully injected into left thigh. Technique reviewed. Sample given to replace malfunctioning pen.

## 2020-09-05 NOTE — Telephone Encounter (Signed)
Pt c/o medication issue:  1. Name of Medication:  Evolocumab (REPATHA SURECLICK) XX123456 MG/ML SOAJ  2. How are you currently taking this medication (dosage and times per day)?  Medication was injected into patient's left thigh yesterday at [facility] office.  3. Are you having a reaction (difficulty breathing--STAT)?  See below  4. What is your medication issue?   Patient states when he got home around 4:30 PM yesterday he ate dinner and shortly after he developed severe nasal congestion. He states he woke up this morning, gently blew his nose and blood started gushing out. He states he eventually got the bleeding to stop and he wants to know if he needs to discontinue. He states he normally has heavy nose bleeds with statins, but he didn't expect to have a reaction to Repatha.

## 2020-09-05 NOTE — Telephone Encounter (Signed)
Spoke to the patient just now and let him know Dr. Munley's recommendations. He verbalizes understanding and thanks me for the call back.  

## 2020-09-17 ENCOUNTER — Ambulatory Visit (INDEPENDENT_AMBULATORY_CARE_PROVIDER_SITE_OTHER): Payer: Medicare HMO | Admitting: Cardiology

## 2020-09-17 ENCOUNTER — Encounter: Payer: Self-pay | Admitting: Gastroenterology

## 2020-09-17 ENCOUNTER — Ambulatory Visit (INDEPENDENT_AMBULATORY_CARE_PROVIDER_SITE_OTHER): Payer: Medicare HMO | Admitting: Gastroenterology

## 2020-09-17 ENCOUNTER — Encounter: Payer: Self-pay | Admitting: Cardiology

## 2020-09-17 ENCOUNTER — Other Ambulatory Visit: Payer: Self-pay

## 2020-09-17 VITALS — BP 100/70 | HR 78 | Ht 68.0 in | Wt 207.0 lb

## 2020-09-17 VITALS — BP 130/70 | HR 83 | Ht 68.0 in | Wt 207.4 lb

## 2020-09-17 DIAGNOSIS — I10 Essential (primary) hypertension: Secondary | ICD-10-CM | POA: Diagnosis not present

## 2020-09-17 DIAGNOSIS — E782 Mixed hyperlipidemia: Secondary | ICD-10-CM

## 2020-09-17 DIAGNOSIS — I2510 Atherosclerotic heart disease of native coronary artery without angina pectoris: Secondary | ICD-10-CM

## 2020-09-17 DIAGNOSIS — R0982 Postnasal drip: Secondary | ICD-10-CM

## 2020-09-17 DIAGNOSIS — R1013 Epigastric pain: Secondary | ICD-10-CM

## 2020-09-17 DIAGNOSIS — E669 Obesity, unspecified: Secondary | ICD-10-CM

## 2020-09-17 DIAGNOSIS — G4733 Obstructive sleep apnea (adult) (pediatric): Secondary | ICD-10-CM

## 2020-09-17 DIAGNOSIS — R0989 Other specified symptoms and signs involving the circulatory and respiratory systems: Secondary | ICD-10-CM | POA: Diagnosis not present

## 2020-09-17 NOTE — Patient Instructions (Signed)
Medication Instructions:  Your physician recommends that you continue on your current medications as directed. Please refer to the Current Medication list given to you today. *If you need a refill on your cardiac medications before your next appointment, please call your pharmacy*   Lab Work: None If you have labs (blood work) drawn today and your tests are completely normal, you will receive your results only by: [place] (if you have MyChart) OR A paper copy in the mail If you have any lab test that is abnormal or we need to change your treatment, we will call you to review the results.   Testing/Procedures: None   Follow-Up: At [facility], you and your health needs are our priority.  As part of our continuing mission to provide you with exceptional heart care, we have created designated Provider Care Teams.  These Care Teams include your primary Cardiologist (physician) and Advanced Practice Providers (APPs -  Physician Assistants and Nurse Practitioners) who all work together to provide you with the care you need, when you need it.  We recommend signing up for the patient portal called "MyChart".  Sign up information is provided on this After Visit Summary.  MyChart is used to connect with patients for Virtual Visits (Telemedicine).  Patients are able to view lab/test results, encounter notes, upcoming appointments, etc.  Non-urgent messages can be sent to your provider as well.   To learn more about what you can do with MyChart, go to [email or web address].    Your next appointment:   1 year(s)  The format for your next appointment:   In Person  Provider:   [place], DO [id number], [place], [place] [address]    Other Instructions

## 2020-09-17 NOTE — Patient Instructions (Signed)
If you are age 83 or older, your body mass index should be between 23-30. Your Body mass index is 31.54 kg/m. If this is out of the aforementioned range listed, please consider follow up with your Primary Care Provider.  If you are age 79 or younger, your body mass index should be between 19-25. Your Body mass index is 31.54 kg/m. If this is out of the aformentioned range listed, please consider follow up with your Primary Care Provider.   __________________________________________________________  The [facility] providers would like to encourage you to use [facility] to communicate with providers for non-urgent requests or questions.  Due to long hold times on the telephone, sending your provider a message by [facility] may be a faster and more efficient way to get a response.  Please allow 48 business hours for a response.  Please remember that this is for non-urgent requests.   Follow up as needed  Thank you for entrusting me with your care and for choosing [facility], Dr. Oretha Caprice
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
## 2020-09-19 ENCOUNTER — Telehealth: Payer: Self-pay | Admitting: Cardiology

## 2020-09-19 NOTE — Telephone Encounter (Signed)
Patient called to inform he is taking repatha without problems. He also states he had nose bleeds and Dr. Harriet Masson thought this had to do with post nasal drip. He states he has had the humidifier on for 2 days all day and evening and has had no problems with nasal drip. He says he does not need a call back.

## 2020-09-24 DIAGNOSIS — L570 Actinic keratosis: Secondary | ICD-10-CM | POA: Diagnosis not present

## 2020-09-24 DIAGNOSIS — C44519 Basal cell carcinoma of skin of other part of trunk: Secondary | ICD-10-CM | POA: Diagnosis not present

## 2020-09-24 DIAGNOSIS — D485 Neoplasm of uncertain behavior of skin: Secondary | ICD-10-CM | POA: Diagnosis not present

## 2020-09-24 DIAGNOSIS — Z85828 Personal history of other malignant neoplasm of skin: Secondary | ICD-10-CM | POA: Diagnosis not present

## 2020-09-24 DIAGNOSIS — C44329 Squamous cell carcinoma of skin of other parts of face: Secondary | ICD-10-CM | POA: Diagnosis not present

## 2020-10-10 NOTE — Telephone Encounter (Signed)
Pt c/o medication issue:  1. Name of Medication: Evolocumab (REPATHA SURECLICK) [id number] MG/ML SOAJ  2. How are you currently taking this medication (dosage and times per day)? Every 14 days   3. Are you having a reaction (difficulty breathing--STAT)? No   4. What is your medication issue? Matthew [name] is calling stating that he was supposed to take Repatha on 10/03/20. The injection did not inject correctly, so he is having a replacement sent and it will arrive 09/20. He is wanting to know when he should take the injection due to this.

## 2020-10-14 ENCOUNTER — Telehealth: Payer: Self-pay | Admitting: Family Medicine

## 2020-10-14 DIAGNOSIS — M25511 Pain in right shoulder: Secondary | ICD-10-CM

## 2020-10-14 NOTE — Telephone Encounter (Signed)
He has had no recent visits about his shoulder.  I will find out which one, but would you like for me to have him come in to be evaluated?

## 2020-10-14 NOTE — Telephone Encounter (Signed)
Pt. Called in stating he has been having really bad issues with his shoulder and he is wanting to look into rehab at the location he lives at so he can start exercises to help his shoulder. He would need a referral faxed over to:  [facility] Fax [phone]

## 2020-10-15 NOTE — Telephone Encounter (Signed)
He should take the injection when he receives it whenever it is convenient for him and then set a new schedule for every 2 weeks.

## 2020-10-15 NOTE — Telephone Encounter (Signed)
No answer no vm

## 2020-10-16 ENCOUNTER — Ambulatory Visit: Payer: Medicare HMO | Admitting: Podiatry

## 2020-10-16 NOTE — Telephone Encounter (Signed)
No answer no vm

## 2020-10-16 NOTE — Telephone Encounter (Signed)
Tried calling patient. No answer, line continued to ring unable to leave a message.

## 2020-10-17 NOTE — Telephone Encounter (Signed)
Unable to reach letter sent

## 2020-10-18 NOTE — Telephone Encounter (Signed)
Tried calling patient. No answer, line continued to ring unable to leave a message.

## 2020-10-22 ENCOUNTER — Telehealth: Payer: Self-pay | Admitting: *Deleted

## 2020-10-22 DIAGNOSIS — S91331A Puncture wound without foreign body, right foot, initial encounter: Secondary | ICD-10-CM | POA: Diagnosis not present

## 2020-10-22 DIAGNOSIS — Z23 Encounter for immunization: Secondary | ICD-10-CM | POA: Diagnosis not present

## 2020-10-22 NOTE — Telephone Encounter (Signed)
Spoke to the patient just now and let him know the pharmacists recommendations. He verbalizes understanding and thank me for the call back.

## 2020-10-22 NOTE — Telephone Encounter (Signed)
Spoke with patient he has a had a cough since last month.  Was on an antibiotic for 7 days.  He did covid test last week and it was negative.    Patient declined appointment for today.  Appointment made for tomorrow.

## 2020-10-22 NOTE — Telephone Encounter (Signed)
Who Is Calling Patient / Member / Family / Caregiver Caller Name Matthew [name] Caller Phone Number [phone] Patient Name Matthew [name] Patient DOB Nov 12, 1935 Call Type Message Only Information Provided Reason for Call Request to Schedule Office Appointment Initial Comment Caller states, still coughing up stuff. Cough very productive. may needs another antibiotic and cough syrup. Was on an antibiotic for 7 days. Needs an appt. for today. will needs more medication. Patient request to speak to RN No Additional Comment Caller declined triage. Disp. Time Disposition Final User 10/22/2020 7:45:30 AM General Information Provided Yes Jobie Quaker

## 2020-10-23 ENCOUNTER — Ambulatory Visit: Payer: Medicare HMO | Admitting: Medical

## 2020-10-23 ENCOUNTER — Telehealth: Payer: Self-pay

## 2020-10-23 ENCOUNTER — Ambulatory Visit (INDEPENDENT_AMBULATORY_CARE_PROVIDER_SITE_OTHER): Payer: Medicare HMO | Admitting: Podiatry

## 2020-10-23 ENCOUNTER — Other Ambulatory Visit: Payer: Self-pay

## 2020-10-23 ENCOUNTER — Encounter: Payer: Self-pay | Admitting: Podiatry

## 2020-10-23 DIAGNOSIS — L988 Other specified disorders of the skin and subcutaneous tissue: Secondary | ICD-10-CM | POA: Diagnosis not present

## 2020-10-23 DIAGNOSIS — M79675 Pain in left toe(s): Secondary | ICD-10-CM | POA: Diagnosis not present

## 2020-10-23 DIAGNOSIS — M79674 Pain in right toe(s): Secondary | ICD-10-CM | POA: Diagnosis not present

## 2020-10-23 DIAGNOSIS — B351 Tinea unguium: Secondary | ICD-10-CM | POA: Diagnosis not present

## 2020-10-23 DIAGNOSIS — C44329 Squamous cell carcinoma of skin of other parts of face: Secondary | ICD-10-CM | POA: Diagnosis not present

## 2020-10-23 DIAGNOSIS — Z85828 Personal history of other malignant neoplasm of skin: Secondary | ICD-10-CM | POA: Diagnosis not present

## 2020-10-23 NOTE — Telephone Encounter (Signed)
Called pt to see about the medication error paperwork we received. He states "It was my fault, apparently I didn't push down hard enough. But I received my medication yesterday and I took it at 8:30 am this morning. I heard the 2 clicks today." Pt verbalized understanding that his next dose should be in 14 days.

## 2020-10-23 NOTE — Progress Notes (Signed)
  Subjective:  Patient ID: Matthew [name], male    DOB: Apr 15, 1935,  MRN: [id number]  Chief Complaint  Patient presents with   Nail Problem    Nail trim    85 y.o. male returns for the above complaint.  Patient is here for painful thickened elongated mycotic toenails x10.  Patient states that he has not been able to cut his own toenails because they are gone so dystrophic.  Patient states that he is not able to cut it them.  He denies any other acute complaints  Objective:  There were no vitals filed for this visit. Podiatric Exam: Vascular: dorsalis pedis and posterior tibial pulses are palpable bilateral. Capillary return is immediate. Temperature gradient is WNL. Skin turgor WNL  Sensorium: Normal Semmes Weinstein monofilament test. Normal tactile sensation bilaterally. Nail Exam: Pt has thick disfigured discolored nails with subungual debris noted bilateral entire nail hallux through fifth toenails Ulcer Exam: There is no evidence of ulcer or pre-ulcerative changes or infection. Orthopedic Exam: Muscle tone and strength are WNL. No limitations in general ROM. No crepitus or effusions noted. HAV  B/L.  Hammer toes 2-5  B/L. Skin: No Porokeratosis. No infection or ulcers.  Sweatiness without porokeratosis noted to bilateral lower extremity  Assessment & Plan:  Patient was evaluated and treated and all questions answered.  Onychomycosis with pain  -Nails palliatively debrided as below. -Educated on self-care  Left hallux ingrown nail/dystrophic nail -I discussed with the patient extensive detail about the etiology of ingrown nail and various treatment options were discussed.  I believe patient will benefit from removal of the ingrown toenail however patient would like to hold off for now and will think about it.  We will discuss this at a later time to have it removed.  Hyperhidrosis bilateral plantar feet -I explained to patient the etiology of hyperhidrosis and various treatment  options were extensively discussed.  Given that this is mild to moderate in nature I believe patient will benefit from [place] foot powder.  I encouraged using it twice a day.  Patient states understanding will do so.   Procedure: Nail Debridement Rationale: pain  Type of Debridement: manual, sharp debridement. Instrumentation: Nail nipper, rotary burr. Number of Nails: 10  Procedures and Treatment: Consent by patient was obtained for treatment procedures. The patient understood the discussion of treatment and procedures well. All questions were answered thoroughly reviewed. Debridement of mycotic and hypertrophic toenails, 1 through 5 bilateral and clearing of subungual debris. No ulceration, no infection noted.  Return Visit-Office Procedure: Patient instructed to return to the office for a follow up visit 3 months for continued evaluation and treatment.  Boneta Lucks, DPM    No follow-ups on file.

## 2020-10-24 NOTE — Telephone Encounter (Signed)
Patient notified and he will do xray tomorrow.  Order placed for shoulder.  Will need to fax PT referral over to [facility] after xray results are back.

## 2020-10-24 NOTE — Telephone Encounter (Signed)
Patient called back regarding missed call. Patient stated he does not answer unidentified calls that do not say [facility]. Please contact patient back.

## 2020-10-25 ENCOUNTER — Ambulatory Visit (HOSPITAL_BASED_OUTPATIENT_CLINIC_OR_DEPARTMENT_OTHER)
Admission: RE | Admit: 2020-10-25 | Discharge: 2020-10-25 | Disposition: A | Discharge status: Home / Self Care | Payer: Medicare HMO | Source: Ambulatory Visit | Attending: Family Medicine | Admitting: Family Medicine

## 2020-10-25 ENCOUNTER — Other Ambulatory Visit: Payer: Self-pay

## 2020-10-25 ENCOUNTER — Encounter: Payer: Self-pay | Admitting: Medical

## 2020-10-25 ENCOUNTER — Ambulatory Visit (INDEPENDENT_AMBULATORY_CARE_PROVIDER_SITE_OTHER): Payer: Medicare HMO | Admitting: Medical

## 2020-10-25 ENCOUNTER — Ambulatory Visit (HOSPITAL_BASED_OUTPATIENT_CLINIC_OR_DEPARTMENT_OTHER)
Admission: RE | Admit: 2020-10-25 | Discharge: 2020-10-25 | Disposition: A | Discharge status: Home / Self Care | Payer: Medicare HMO | Source: Ambulatory Visit | Attending: Medical | Admitting: Medical

## 2020-10-25 VITALS — BP 120/70 | HR 75 | Temp 98.7°F | Resp 18 | Ht 68.0 in | Wt 206.6 lb

## 2020-10-25 DIAGNOSIS — R600 Localized edema: Secondary | ICD-10-CM | POA: Diagnosis not present

## 2020-10-25 DIAGNOSIS — K219 Gastro-esophageal reflux disease without esophagitis: Secondary | ICD-10-CM

## 2020-10-25 DIAGNOSIS — J309 Allergic rhinitis, unspecified: Secondary | ICD-10-CM

## 2020-10-25 DIAGNOSIS — Z87891 Personal history of nicotine dependence: Secondary | ICD-10-CM

## 2020-10-25 DIAGNOSIS — M25511 Pain in right shoulder: Secondary | ICD-10-CM | POA: Diagnosis not present

## 2020-10-25 DIAGNOSIS — R059 Cough, unspecified: Secondary | ICD-10-CM | POA: Diagnosis not present

## 2020-10-25 DIAGNOSIS — J439 Emphysema, unspecified: Secondary | ICD-10-CM | POA: Diagnosis not present

## 2020-10-25 DIAGNOSIS — J438 Other emphysema: Secondary | ICD-10-CM

## 2020-10-25 LAB — COMPREHENSIVE METABOLIC PANEL
ALT: 16 U/L (ref 0–53)
AST: 21 U/L (ref 0–37)
Albumin: 4.2 g/dL (ref 3.5–5.2)
Alkaline Phosphatase: 46 U/L (ref 39–117)
BUN: 12 mg/dL (ref 6–23)
CO2: 28 mEq/L (ref 19–32)
Calcium: 9.6 mg/dL (ref 8.4–10.5)
Chloride: 98 mEq/L (ref 96–112)
Creatinine, Ser: 0.96 mg/dL (ref 0.40–1.50)
GFR: 73.24 mL/min (ref >60.00)
Glucose, Bld: 108 mg/dL — ABNORMAL HIGH (ref 70–99)
Potassium: 4.4 mEq/L (ref 3.5–5.1)
Sodium: 133 mEq/L — ABNORMAL LOW (ref 135–145)
Total Bilirubin: 0.5 mg/dL (ref 0.2–1.2)
Total Protein: 6.6 g/dL (ref 6.0–8.3)

## 2020-10-25 LAB — BRAIN NATRIURETIC PEPTIDE: Pro B Natriuretic peptide (BNP): 44 pg/mL (ref 0.0–100.0)

## 2020-10-25 MED ORDER — LEVOCETIRIZINE DIHYDROCHLORIDE 5 MG PO TABS
5.0000 mg | ORAL_TABLET | Freq: Every evening | ORAL | 0 refills | Status: DC
Start: 2020-10-25 — End: 2021-09-18

## 2020-10-25 NOTE — Addendum Note (Signed)
Addended by: Anabel Halon on: 10/25/2020 09:17 PM   Modules accepted: Orders

## 2020-10-25 NOTE — Patient Instructions (Addendum)
Cough for more than one month. Persisted despite  antibiotic one month ago(though can't find that rx in epic). Will get chest xray.  Cause of cough can be infectious, reactive airways, hx of smoking related, allergies, reflux and other causes.  Presently want you to continue astelin and restart montelukast in event allergy cause. In one week after being on montelukast could start back on xyzal if cough still present.  Prescribe benzonatate cough tabs.  Reflux appears controlled and likely not cause presently.  With hx of smoking will follow xray. If cough persists despite the above then likely refer you to pulmonologist.   Some 1+ pedal edema symmetric. Will get cmp and bnp.  By chest xray emphysema changes. In light of this along with pipe smoking history will refer to pulmonologist. Possible pft. Also may qualify for ct screening chest.  Follow up in 2 weeks or sooner if needed.

## 2020-10-25 NOTE — Addendum Note (Signed)
Addended by: Jeronimo Greaves on: 10/25/2020 10:10 AM   Modules accepted: Orders
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
## 2020-10-28 ENCOUNTER — Other Ambulatory Visit: Payer: Self-pay | Admitting: Family Medicine

## 2020-10-28 DIAGNOSIS — M19011 Primary osteoarthritis, right shoulder: Secondary | ICD-10-CM

## 2020-10-28 DIAGNOSIS — D489 Neoplasm of uncertain behavior, unspecified: Secondary | ICD-10-CM

## 2020-11-01 DIAGNOSIS — N3000 Acute cystitis without hematuria: Secondary | ICD-10-CM | POA: Diagnosis not present

## 2020-11-06 ENCOUNTER — Telehealth: Payer: Self-pay | Admitting: Cardiology

## 2020-11-06 NOTE — Telephone Encounter (Signed)
Attempted to call patient, left message for patient to call back to office.   

## 2020-11-06 NOTE — Telephone Encounter (Signed)
There is a customer service phone number on the Repatha box that he can call and request a replacement.  They will send a fax for [place] to sign authorizing them to mail one to his house.

## 2020-11-06 NOTE — Telephone Encounter (Signed)
Pt c/o medication issue:  1. Name of Medication:  Evolocumab (REPATHA SURECLICK) 004 MG/ML SOAJ  2. How are you currently taking this medication (dosage and times per day)? States he is no longer taking it  3. Are you having a reaction (difficulty breathing--STAT)? No   4. What is your medication issue? Rayn is calling stating he is no longer going to take this medication. He reports the last injection did not click and release the medication. Reports this is the second occurrence of this and it is too expensive for him to continue with these problems.

## 2020-11-06 NOTE — Telephone Encounter (Signed)
Spoke to patient he stated he gave his self Repatha this morning and medication did not release.Stated this is the second time this has happened.[facility] told they could not help him he would have to call manufacture.Advised I will send message to our pharmacy team for advice.

## 2020-11-06 NOTE — Telephone Encounter (Signed)
Spoke to patient Pharm D's advice given. 

## 2020-11-06 NOTE — Telephone Encounter (Signed)
Pt is returning call.  

## 2020-11-08 ENCOUNTER — Other Ambulatory Visit: Payer: Self-pay

## 2020-11-08 ENCOUNTER — Ambulatory Visit (INDEPENDENT_AMBULATORY_CARE_PROVIDER_SITE_OTHER): Payer: Medicare HMO | Admitting: Medical

## 2020-11-08 VITALS — BP 122/64 | HR 79 | Temp 98.4°F | Resp 16 | Ht 68.0 in | Wt 202.0 lb

## 2020-11-08 DIAGNOSIS — R739 Hyperglycemia, unspecified: Secondary | ICD-10-CM

## 2020-11-08 DIAGNOSIS — R058 Other specified cough: Secondary | ICD-10-CM

## 2020-11-08 MED ORDER — LEVOCETIRIZINE DIHYDROCHLORIDE 5 MG PO TABS: 5.0000 mg | ORAL_TABLET | Freq: Every evening | ORAL | 3 refills | Status: DC

## 2020-11-08 NOTE — Patient Instructions (Addendum)
Your cough recently resolved quickly with restarting xyzal. Continue nasal spray astelin and xyzal.  On work up for cough you had cxr finding of possible emphysema. Referred to pulmonologist. Keep that appt.  For mild elevated sugar will get a1c today.  Follow up as regularly scheduled with pcp or sooner if needed.
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
## 2020-11-11 LAB — HEMOGLOBIN A1C: Hgb A1c MFr Bld: 6.4 % (ref 4.6–6.5)

## 2020-11-13 ENCOUNTER — Other Ambulatory Visit: Payer: Self-pay

## 2020-11-13 ENCOUNTER — Ambulatory Visit (INDEPENDENT_AMBULATORY_CARE_PROVIDER_SITE_OTHER): Payer: Medicare HMO | Admitting: Orthopedic Surgery

## 2020-11-13 DIAGNOSIS — M7541 Impingement syndrome of right shoulder: Secondary | ICD-10-CM

## 2020-11-17 ENCOUNTER — Encounter: Payer: Self-pay | Admitting: Orthopedic Surgery
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
## 2020-11-18 DIAGNOSIS — M19011 Primary osteoarthritis, right shoulder: Secondary | ICD-10-CM | POA: Diagnosis not present

## 2020-11-18 DIAGNOSIS — M6281 Muscle weakness (generalized): Secondary | ICD-10-CM | POA: Diagnosis not present

## 2020-11-18 DIAGNOSIS — M25511 Pain in right shoulder: Secondary | ICD-10-CM | POA: Diagnosis not present

## 2020-11-22 DIAGNOSIS — M19011 Primary osteoarthritis, right shoulder: Secondary | ICD-10-CM | POA: Diagnosis not present

## 2020-11-22 DIAGNOSIS — M6281 Muscle weakness (generalized): Secondary | ICD-10-CM | POA: Diagnosis not present

## 2020-11-22 DIAGNOSIS — M25511 Pain in right shoulder: Secondary | ICD-10-CM | POA: Diagnosis not present

## 2020-11-23 ENCOUNTER — Other Ambulatory Visit: Payer: Self-pay | Admitting: Family Medicine

## 2020-11-25 DIAGNOSIS — M25511 Pain in right shoulder: Secondary | ICD-10-CM | POA: Diagnosis not present

## 2020-11-25 DIAGNOSIS — M19011 Primary osteoarthritis, right shoulder: Secondary | ICD-10-CM | POA: Diagnosis not present

## 2020-11-25 DIAGNOSIS — M6281 Muscle weakness (generalized): Secondary | ICD-10-CM | POA: Diagnosis not present

## 2020-11-29 DIAGNOSIS — M19011 Primary osteoarthritis, right shoulder: Secondary | ICD-10-CM | POA: Diagnosis not present

## 2020-11-29 DIAGNOSIS — M25511 Pain in right shoulder: Secondary | ICD-10-CM | POA: Diagnosis not present

## 2020-11-29 DIAGNOSIS — M6281 Muscle weakness (generalized): Secondary | ICD-10-CM | POA: Diagnosis not present

## 2020-12-02 DIAGNOSIS — M19011 Primary osteoarthritis, right shoulder: Secondary | ICD-10-CM | POA: Diagnosis not present

## 2020-12-02 DIAGNOSIS — M25511 Pain in right shoulder: Secondary | ICD-10-CM | POA: Diagnosis not present

## 2020-12-02 DIAGNOSIS — M6281 Muscle weakness (generalized): Secondary | ICD-10-CM | POA: Diagnosis not present

## 2020-12-05 DIAGNOSIS — M19011 Primary osteoarthritis, right shoulder: Secondary | ICD-10-CM | POA: Diagnosis not present

## 2020-12-05 DIAGNOSIS — M6281 Muscle weakness (generalized): Secondary | ICD-10-CM | POA: Diagnosis not present

## 2020-12-05 DIAGNOSIS — M25511 Pain in right shoulder: Secondary | ICD-10-CM | POA: Diagnosis not present

## 2020-12-09 ENCOUNTER — Encounter: Payer: Self-pay | Admitting: Family Medicine

## 2020-12-09 ENCOUNTER — Other Ambulatory Visit: Payer: Self-pay

## 2020-12-09 ENCOUNTER — Ambulatory Visit (INDEPENDENT_AMBULATORY_CARE_PROVIDER_SITE_OTHER): Payer: Medicare HMO | Admitting: Family Medicine

## 2020-12-09 VITALS — BP 124/62 | HR 84 | Temp 97.9°F | Resp 16 | Wt 203.6 lb

## 2020-12-09 DIAGNOSIS — E785 Hyperlipidemia, unspecified: Secondary | ICD-10-CM | POA: Diagnosis not present

## 2020-12-09 DIAGNOSIS — K219 Gastro-esophageal reflux disease without esophagitis: Secondary | ICD-10-CM

## 2020-12-09 DIAGNOSIS — I10 Essential (primary) hypertension: Secondary | ICD-10-CM | POA: Diagnosis not present

## 2020-12-09 DIAGNOSIS — Z23 Encounter for immunization: Secondary | ICD-10-CM | POA: Diagnosis not present

## 2020-12-09 DIAGNOSIS — I2510 Atherosclerotic heart disease of native coronary artery without angina pectoris: Secondary | ICD-10-CM

## 2020-12-09 DIAGNOSIS — M791 Myalgia, unspecified site: Secondary | ICD-10-CM | POA: Diagnosis not present

## 2020-12-09 DIAGNOSIS — T466X5A Adverse effect of antihyperlipidemic and antiarteriosclerotic drugs, initial encounter: Secondary | ICD-10-CM

## 2020-12-09 DIAGNOSIS — R739 Hyperglycemia, unspecified: Secondary | ICD-10-CM | POA: Diagnosis not present

## 2020-12-09 LAB — COMPREHENSIVE METABOLIC PANEL
ALT: 16 U/L (ref 0–53)
AST: 21 U/L (ref 0–37)
Albumin: 4.2 g/dL (ref 3.5–5.2)
Alkaline Phosphatase: 43 U/L (ref 39–117)
BUN: 18 mg/dL (ref 6–23)
CO2: 28 mEq/L (ref 19–32)
Calcium: 9.5 mg/dL (ref 8.4–10.5)
Chloride: 98 mEq/L (ref 96–112)
Creatinine, Ser: 0.98 mg/dL (ref 0.40–1.50)
GFR: 71.39 mL/min (ref >60.00)
Glucose, Bld: 101 mg/dL — ABNORMAL HIGH (ref 70–99)
Potassium: 4.5 mEq/L (ref 3.5–5.1)
Sodium: 132 mEq/L — ABNORMAL LOW (ref 135–145)
Total Bilirubin: 0.6 mg/dL (ref 0.2–1.2)
Total Protein: 6.7 g/dL (ref 6.0–8.3)

## 2020-12-09 LAB — CBC
HCT: 37.9 % — ABNORMAL LOW (ref 39.0–52.0)
Hemoglobin: 12.8 g/dL — ABNORMAL LOW (ref 13.0–17.0)
MCHC: 33.7 g/dL (ref 30.0–36.0)
MCV: 88.2 fl (ref 78.0–100.0)
Platelets: 206 10*3/uL (ref 150.0–400.0)
RBC: 4.29 Mil/uL (ref 4.22–5.81)
RDW: 13.8 % (ref 11.5–15.5)
WBC: 7.6 10*3/uL (ref 4.0–10.5)

## 2020-12-09 LAB — LIPID PANEL
Cholesterol: 148 mg/dL (ref 0–200)
HDL: 42.8 mg/dL (ref >39.00)
LDL Cholesterol: 90 mg/dL (ref 0–99)
NonHDL: 105.65
Total CHOL/HDL Ratio: 3
Triglycerides: 80 mg/dL (ref 0.0–149.0)
VLDL: 16 mg/dL (ref 0.0–40.0)

## 2020-12-09 LAB — TSH: TSH: 1.66 u[IU]/mL (ref 0.35–5.50)

## 2020-12-09 NOTE — Assessment & Plan Note (Signed)
Does not tolerate statins.

## 2020-12-09 NOTE — Assessment & Plan Note (Signed)
No complaints at todays visit, discussed minimizing processed foods and carbs

## 2020-12-09 NOTE — Assessment & Plan Note (Signed)
Well controlled, no changes to meds. Encouraged heart healthy diet such as the DASH diet and exercise as tolerated. Had Bivalent COVID shot at [facility] but given High Dose flu shot today

## 2020-12-09 NOTE — Telephone Encounter (Signed)
This encounter was created in error - please disregard.

## 2020-12-09 NOTE — Assessment & Plan Note (Signed)
Encourage heart healthy diet such as MIND or DASH diet, increase exercise, avoid trans fats, simple carbohydrates and processed foods, consider a krill or fish or flaxseed oil cap daily.  °
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
## 2020-12-09 NOTE — Assessment & Plan Note (Signed)
Asymptomatic. 

## 2020-12-09 NOTE — Assessment & Plan Note (Signed)
hgba1c acceptable, minimize simple carbs. Increase exercise as tolerated.  

## 2020-12-09 NOTE — Patient Instructions (Signed)
Carbohydrate Counting for adults Carbohydrate counting is a method of keeping track of how many carbohydrates you eat. Eating carbohydrates increases the amount of sugar (glucose) in the blood. Counting how many carbohydrates you eat improves how well you manage your blood glucose. This, in turn, helps you manage your diabetes. Carbohydrates are measured in grams (g) per serving. It is important to know how many carbohydrates (in grams or by serving size) you can have in each meal. This is different for every person. A dietitian can help you make a meal plan and calculate how many carbohydrates you should have at each meal and snack. What foods contain carbohydrates? Carbohydrates are found in the following foods: Grains, such as breads and cereals. Dried beans and soy products. Starchy vegetables, such as potatoes, peas, and corn. Fruit and fruit juices. Milk and yogurt. Sweets and snack foods, such as cake, cookies, candy, chips, and soft drinks. How do I count carbohydrates in foods? There are two ways to count carbohydrates in food. You can read food labels or learn standard serving sizes of foods. You can use either of these methods or a combination of both. Using the Nutrition Facts label The Nutrition Facts list is included on the labels of almost all packaged foods and beverages in the [place]. It includes: The serving size. Information about nutrients in each serving, including the grams of carbohydrate per serving. To use the Nutrition Facts, decide how many servings you will have. Then, multiply the number of servings by the number of carbohydrates per serving. The resulting number is the total grams of carbohydrates that you will be having. Learning the standard serving sizes of foods When you eat carbohydrate foods that are not packaged or do not include Nutrition Facts on the label, you need to measure the servings in order to count the grams of carbohydrates. Measure the  foods that you will eat with a food scale or measuring cup, if needed. Decide how many standard-size servings you will eat. Multiply the number of servings by 15. For foods that contain carbohydrates, one serving equals 15 g of carbohydrates. For example, if you eat 2 cups or 10 oz (300 g) of strawberries, you will have eaten 2 servings and 30 g of carbohydrates (2 servings x 15 g = 30 g). For foods that have more than one food mixed, such as soups and casseroles, you must count the carbohydrates in each food that is included. The following list contains standard serving sizes of common carbohydrate-rich foods. Each of these servings has about 15 g of carbohydrates: 1 slice of bread. 1 six-inch (15 cm) tortilla. ? cup or 2 oz (53 g) cooked rice or pasta.  cup or 3 oz (85 g) cooked or canned, drained and rinsed beans or lentils.  cup or 3 oz (85 g) starchy vegetable, such as peas, corn, or squash.  cup or 4 oz (120 g) hot cereal.  cup or 3 oz (85 g) boiled or mashed potatoes, or  or 3 oz (85 g) of a large baked potato.  cup or 4 fl oz (118 mL) fruit juice. 1 cup or 8 fl oz (237 mL) milk. 1 small or 4 oz (106 g) apple.  or 2 oz (63 g) of a medium banana. 1 cup or 5 oz (150 g) strawberries. 3 cups or 1 oz (28.3 g) popped popcorn. What is an example of carbohydrate counting? To calculate the grams of carbohydrates in this sample meal, follow the steps shown below.  Sample meal 3 oz (85 g) chicken breast. ? cup or 4 oz (106 g) brown rice.  cup or 3 oz (85 g) corn. 1 cup or 8 fl oz (237 mL) milk. 1 cup or 5 oz (150 g) strawberries with sugar-free whipped topping. Carbohydrate calculation Identify the foods that contain carbohydrates: Rice. Corn. Milk. Strawberries. Calculate how many servings you have of each food: 2 servings rice. 1 serving corn. 1 serving milk. 1 serving strawberries. Multiply each number of servings by 15 g: 2 servings rice x 15 g = 30 g. 1 serving corn x  15 g = 15 g. 1 serving milk x 15 g = 15 g. 1 serving strawberries x 15 g = 15 g. Add together all of the amounts to find the total grams of carbohydrates eaten: 30 g + 15 g + 15 g + 15 g = 75 g of carbohydrates total. What are tips for following this plan? Shopping Develop a meal plan and then make a shopping list. Buy fresh and frozen vegetables, fresh and frozen fruit, dairy, eggs, beans, lentils, and whole grains. Look at food labels. Choose foods that have more fiber and less sugar. Avoid processed foods and foods with added sugars. Meal planning Aim to have the same number of grams of carbohydrates at each meal and for each snack time. Plan to have regular, balanced meals and snacks. Where to find more information American Diabetes Association: diabetes.org Centers for Disease Control and Prevention: StoreMirror.com.cy Academy of Nutrition and Dietetics: eatright.org Association of Diabetes Care & Education Specialists: diabeteseducator.org Summary Carbohydrate counting is a method of keeping track of how many carbohydrates you eat. Eating carbohydrates increases the amount of sugar (glucose) in your blood. Counting how many carbohydrates you eat improves how well you manage your blood glucose. This helps you manage your diabetes. A dietitian can help you make a meal plan and calculate how many carbohydrates you should have at each meal and snack. This information is not intended to replace advice given to you by your health care provider. Make sure you discuss any questions you have with your health care provider. Document Revised: 08/23/2019 Document Reviewed: 08/23/2019 Elsevier Patient Education  [place].

## 2020-12-10 ENCOUNTER — Other Ambulatory Visit: Payer: Self-pay

## 2020-12-10 DIAGNOSIS — E871 Hypo-osmolality and hyponatremia: Secondary | ICD-10-CM

## 2020-12-11 DIAGNOSIS — M25511 Pain in right shoulder: Secondary | ICD-10-CM | POA: Diagnosis not present

## 2020-12-11 DIAGNOSIS — M19011 Primary osteoarthritis, right shoulder: Secondary | ICD-10-CM | POA: Diagnosis not present

## 2020-12-11 DIAGNOSIS — M6281 Muscle weakness (generalized): Secondary | ICD-10-CM | POA: Diagnosis not present

## 2020-12-12 ENCOUNTER — Telehealth: Payer: Self-pay | Admitting: Cardiology

## 2020-12-12 DIAGNOSIS — C61 Malignant neoplasm of prostate: Secondary | ICD-10-CM | POA: Diagnosis not present

## 2020-12-12 DIAGNOSIS — N529 Male erectile dysfunction, unspecified: Secondary | ICD-10-CM | POA: Diagnosis not present

## 2020-12-12 DIAGNOSIS — H353131 Nonexudative age-related macular degeneration, bilateral, early dry stage: Secondary | ICD-10-CM | POA: Diagnosis not present

## 2020-12-12 DIAGNOSIS — H35373 Puckering of macula, bilateral: Secondary | ICD-10-CM | POA: Diagnosis not present

## 2020-12-12 DIAGNOSIS — H0102A Squamous blepharitis right eye, upper and lower eyelids: Secondary | ICD-10-CM | POA: Diagnosis not present

## 2020-12-12 DIAGNOSIS — H35033 Hypertensive retinopathy, bilateral: Secondary | ICD-10-CM | POA: Diagnosis not present

## 2020-12-12 NOTE — Telephone Encounter (Signed)
Verbal refill for repatha replacement sent

## 2020-12-13 DIAGNOSIS — M25511 Pain in right shoulder: Secondary | ICD-10-CM | POA: Diagnosis not present

## 2020-12-13 DIAGNOSIS — M6281 Muscle weakness (generalized): Secondary | ICD-10-CM | POA: Diagnosis not present

## 2020-12-13 DIAGNOSIS — M19011 Primary osteoarthritis, right shoulder: Secondary | ICD-10-CM | POA: Diagnosis not present

## 2020-12-17 DIAGNOSIS — M25511 Pain in right shoulder: Secondary | ICD-10-CM | POA: Diagnosis not present

## 2020-12-17 DIAGNOSIS — M6281 Muscle weakness (generalized): Secondary | ICD-10-CM | POA: Diagnosis not present

## 2020-12-17 DIAGNOSIS — M19011 Primary osteoarthritis, right shoulder: Secondary | ICD-10-CM | POA: Diagnosis not present

## 2020-12-19 DIAGNOSIS — M25511 Pain in right shoulder: Secondary | ICD-10-CM | POA: Diagnosis not present

## 2020-12-19 DIAGNOSIS — M19011 Primary osteoarthritis, right shoulder: Secondary | ICD-10-CM | POA: Diagnosis not present

## 2020-12-19 DIAGNOSIS — M6281 Muscle weakness (generalized): Secondary | ICD-10-CM | POA: Diagnosis not present

## 2020-12-23 ENCOUNTER — Other Ambulatory Visit: Payer: Self-pay | Admitting: Gastroenterology

## 2020-12-24 ENCOUNTER — Other Ambulatory Visit (INDEPENDENT_AMBULATORY_CARE_PROVIDER_SITE_OTHER): Payer: Medicare HMO

## 2020-12-24 ENCOUNTER — Other Ambulatory Visit: Payer: Self-pay

## 2020-12-24 DIAGNOSIS — M25511 Pain in right shoulder: Secondary | ICD-10-CM | POA: Diagnosis not present

## 2020-12-24 DIAGNOSIS — E871 Hypo-osmolality and hyponatremia: Secondary | ICD-10-CM

## 2020-12-24 DIAGNOSIS — M19011 Primary osteoarthritis, right shoulder: Secondary | ICD-10-CM | POA: Diagnosis not present

## 2020-12-24 DIAGNOSIS — M6281 Muscle weakness (generalized): Secondary | ICD-10-CM | POA: Diagnosis not present

## 2020-12-24 LAB — COMPREHENSIVE METABOLIC PANEL
ALT: 18 U/L (ref 0–53)
AST: 23 U/L (ref 0–37)
Albumin: 4.3 g/dL (ref 3.5–5.2)
Alkaline Phosphatase: 47 U/L (ref 39–117)
BUN: 16 mg/dL (ref 6–23)
CO2: 27 mEq/L (ref 19–32)
Calcium: 9.6 mg/dL (ref 8.4–10.5)
Chloride: 97 mEq/L (ref 96–112)
Creatinine, Ser: 1.11 mg/dL (ref 0.40–1.50)
GFR: 61.46 mL/min (ref >60.00)
Glucose, Bld: 130 mg/dL — ABNORMAL HIGH (ref 70–99)
Potassium: 4 mEq/L (ref 3.5–5.1)
Sodium: 132 mEq/L — ABNORMAL LOW (ref 135–145)
Total Bilirubin: 0.6 mg/dL (ref 0.2–1.2)
Total Protein: 6.7 g/dL (ref 6.0–8.3)

## 2020-12-25 ENCOUNTER — Encounter: Payer: Self-pay | Admitting: *Deleted

## 2020-12-31 DIAGNOSIS — M19011 Primary osteoarthritis, right shoulder: Secondary | ICD-10-CM | POA: Diagnosis not present

## 2020-12-31 DIAGNOSIS — M25511 Pain in right shoulder: Secondary | ICD-10-CM | POA: Diagnosis not present

## 2020-12-31 DIAGNOSIS — M6281 Muscle weakness (generalized): Secondary | ICD-10-CM | POA: Diagnosis not present

## 2021-01-02 DIAGNOSIS — M6281 Muscle weakness (generalized): Secondary | ICD-10-CM | POA: Diagnosis not present

## 2021-01-02 DIAGNOSIS — M25511 Pain in right shoulder: Secondary | ICD-10-CM | POA: Diagnosis not present

## 2021-01-02 DIAGNOSIS — M19011 Primary osteoarthritis, right shoulder: Secondary | ICD-10-CM | POA: Diagnosis not present

## 2021-01-06 ENCOUNTER — Other Ambulatory Visit: Payer: Self-pay | Admitting: Cardiology

## 2021-01-07 DIAGNOSIS — M6281 Muscle weakness (generalized): Secondary | ICD-10-CM | POA: Diagnosis not present

## 2021-01-07 DIAGNOSIS — M19011 Primary osteoarthritis, right shoulder: Secondary | ICD-10-CM | POA: Diagnosis not present

## 2021-01-07 DIAGNOSIS — M25511 Pain in right shoulder: Secondary | ICD-10-CM | POA: Diagnosis not present

## 2021-01-09 DIAGNOSIS — M6281 Muscle weakness (generalized): Secondary | ICD-10-CM | POA: Diagnosis not present

## 2021-01-09 DIAGNOSIS — M19011 Primary osteoarthritis, right shoulder: Secondary | ICD-10-CM | POA: Diagnosis not present

## 2021-01-09 DIAGNOSIS — M25511 Pain in right shoulder: Secondary | ICD-10-CM | POA: Diagnosis not present

## 2021-01-20 ENCOUNTER — Telehealth: Payer: Self-pay | Admitting: Cardiology

## 2021-01-20 NOTE — Addendum Note (Signed)
Addended by: Rexanne Mano B on: 01/20/2021 10:37 AM   Modules accepted: Orders

## 2021-01-20 NOTE — Telephone Encounter (Signed)
Returned call to patient who was calling in regard to his Plavix and his aspirin. Patient states he thinks that he was supposed to stop his Plavix this year since its been one year since his stent placement but was unsure if he could go ahead and stop the Plavix and if he should continue the aspirin. Patient also states that he thinks this may be causing his feet and legs to feel numb and tingly. Patient denies any pain in legs or feet and denies any other symptoms.. Advised patient I would forward message to Dr. Harriet Masson but also advised patient to reach out to PCP and he verbalized understanding.

## 2021-01-20 NOTE — Telephone Encounter (Signed)
Returned call to patient, advised patient of Dr. Terrial Rhodes recommendations. Patient verbalized understanding.   Advised patient to call back to office with any issues, questions, or concerns. Patient verbalized understanding.   Medication list updated.

## 2021-01-20 NOTE — Telephone Encounter (Signed)
Pt c/o medication issue:  1. Name of Medication:  ASPIRIN ADULT LOW STRENGTH 81 MG EC tablet clopidogrel (PLAVIX) 75 MG tablet  2. How are you currently taking this medication (dosage and times per day)? 1 tablet daily of each  3. Are you having a reaction (difficulty breathing--STAT)? no  4. What is your medication issue? Patient would like to know if he needs to continue taking the medications. He says he was told to take them for a year, but he started them 11/23/19 so it has been over a year.

## 2021-01-20 NOTE — Telephone Encounter (Signed)
Yes, he may stop the plavix

## 2021-01-22 ENCOUNTER — Other Ambulatory Visit: Payer: Self-pay

## 2021-01-22 ENCOUNTER — Ambulatory Visit: Payer: Medicare HMO | Admitting: Podiatry

## 2021-01-22 DIAGNOSIS — M79674 Pain in right toe(s): Secondary | ICD-10-CM

## 2021-01-22 DIAGNOSIS — B351 Tinea unguium: Secondary | ICD-10-CM | POA: Diagnosis not present

## 2021-01-22 DIAGNOSIS — M79675 Pain in left toe(s): Secondary | ICD-10-CM

## 2021-01-23 ENCOUNTER — Encounter: Payer: Self-pay | Admitting: Podiatry

## 2021-01-23 NOTE — Progress Notes (Signed)
°  Subjective:  Patient ID: Matthew [name], male    DOB: 05/19/1935,  MRN: [id number]  Chief Complaint  Patient presents with   Nail Problem    Nail trim    85 y.o. male returns for the above complaint.  Patient is here for painful thickened elongated mycotic toenails x10.  Patient states that he has not been able to cut his own toenails because they are gone so dystrophic.  Patient states that he is not able to cut it them.  He denies any other acute complaints  Objective:  There were no vitals filed for this visit. Podiatric Exam: Vascular: dorsalis pedis and posterior tibial pulses are palpable bilateral. Capillary return is immediate. Temperature gradient is WNL. Skin turgor WNL  Sensorium: Normal Semmes Weinstein monofilament test. Normal tactile sensation bilaterally. Nail Exam: Pt has thick disfigured discolored nails with subungual debris noted bilateral entire nail hallux through fifth toenails Ulcer Exam: There is no evidence of ulcer or pre-ulcerative changes or infection. Orthopedic Exam: Muscle tone and strength are WNL. No limitations in general ROM. No crepitus or effusions noted. HAV  B/L.  Hammer toes 2-5  B/L. Skin: No Porokeratosis. No infection or ulcers.  Sweatiness without porokeratosis noted to bilateral lower extremity  Assessment & Plan:  Patient was evaluated and treated and all questions answered.  Onychomycosis with pain  -Nails palliatively debrided as below. -Educated on self-care  Left hallux ingrown nail/dystrophic nail -I discussed with the patient extensive detail about the etiology of ingrown nail and various treatment options were discussed.  I believe patient will benefit from removal of the ingrown toenail however patient would like to hold off for now and will think about it.  We will discuss this at a later time to have it removed.  Hyperhidrosis bilateral plantar feet -I explained to patient the etiology of hyperhidrosis and various treatment  options were extensively discussed.  Given that this is mild to moderate in nature I believe patient will benefit from [employer] foot powder.  I encouraged using it twice a day.  Patient states understanding will do so.   Procedure: Nail Debridement Rationale: pain  Type of Debridement: manual, sharp debridement. Instrumentation: Nail nipper, rotary burr. Number of Nails: 10  Procedures and Treatment: Consent by patient was obtained for treatment procedures. The patient understood the discussion of treatment and procedures well. All questions were answered thoroughly reviewed. Debridement of mycotic and hypertrophic toenails, 1 through 5 bilateral and clearing of subungual debris. No ulceration, no infection noted.  Return Visit-Office Procedure: Patient instructed to return to the office for a follow up visit 3 months for continued evaluation and treatment.  Boneta Lucks, DPM    No follow-ups on file.

## 2021-01-28 ENCOUNTER — Telehealth: Payer: Self-pay

## 2021-01-28 NOTE — Telephone Encounter (Signed)
Caller takes approx 10 meds/day. Caller wants provider to review med list and tell caller if there is med should not take or take smaller dose. Caller's feet are bothering him. Caller states used to be very active, sedentary than used to be, not as active anymore. Caller states his feet seem to be falling asleep. Additional Comment Caller declined triage. Provided office hours. Caller states he is very concerned

## 2021-01-28 NOTE — Telephone Encounter (Signed)
caller states, pt needing the office, check on his medicines. Stopping taking cardio medicines. Pt has feet falling a sleep.  Telephone: [phone]

## 2021-01-30 NOTE — Telephone Encounter (Signed)
Pt aware.

## 2021-03-12 ENCOUNTER — Other Ambulatory Visit: Payer: Self-pay

## 2021-03-12 ENCOUNTER — Encounter: Payer: Self-pay | Admitting: Orthopedic Surgery

## 2021-03-12 ENCOUNTER — Ambulatory Visit (INDEPENDENT_AMBULATORY_CARE_PROVIDER_SITE_OTHER): Payer: Medicare HMO | Admitting: Orthopedic Surgery

## 2021-03-12 DIAGNOSIS — M7541 Impingement syndrome of right shoulder: Secondary | ICD-10-CM | POA: Diagnosis not present
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
## 2021-04-03 ENCOUNTER — Ambulatory Visit
Admission: RE | Admit: 2021-04-03 | Discharge: 2021-04-03 | Disposition: A | Discharge status: Home / Self Care | Payer: Medicare HMO | Source: Ambulatory Visit | Attending: Orthopedic Surgery | Admitting: Orthopedic Surgery

## 2021-04-03 ENCOUNTER — Other Ambulatory Visit: Payer: Self-pay

## 2021-04-03 DIAGNOSIS — M7541 Impingement syndrome of right shoulder: Secondary | ICD-10-CM

## 2021-04-03 DIAGNOSIS — S73191A Other sprain of right hip, initial encounter: Secondary | ICD-10-CM | POA: Diagnosis not present

## 2021-04-03 DIAGNOSIS — S46011A Strain of muscle(s) and tendon(s) of the rotator cuff of right shoulder, initial encounter: Secondary | ICD-10-CM | POA: Diagnosis not present

## 2021-04-03 MED ORDER — IOPAMIDOL (ISOVUE-M 200) INJECTION 41%: 15.0000 mL | Freq: Once | INTRAMUSCULAR | Status: DC

## 2021-04-04 NOTE — Progress Notes (Signed)
Does he have follow-up with [place]?

## 2021-04-07 ENCOUNTER — Other Ambulatory Visit: Payer: Self-pay | Admitting: Family Medicine

## 2021-04-07 DIAGNOSIS — I10 Essential (primary) hypertension: Secondary | ICD-10-CM

## 2021-04-08 ENCOUNTER — Telehealth: Payer: Self-pay | Admitting: Orthopedic Surgery

## 2021-04-08 NOTE — Telephone Encounter (Signed)
LMOM for pt to call and sch an appt to review scan with Dr. Marlou Sa ?

## 2021-04-23 ENCOUNTER — Ambulatory Visit (INDEPENDENT_AMBULATORY_CARE_PROVIDER_SITE_OTHER): Payer: Medicare HMO | Admitting: Podiatry

## 2021-04-23 ENCOUNTER — Encounter: Payer: Self-pay | Admitting: Podiatry

## 2021-04-23 ENCOUNTER — Other Ambulatory Visit: Payer: Self-pay

## 2021-04-23 DIAGNOSIS — M79674 Pain in right toe(s): Secondary | ICD-10-CM | POA: Diagnosis not present

## 2021-04-23 DIAGNOSIS — M79675 Pain in left toe(s): Secondary | ICD-10-CM

## 2021-04-23 DIAGNOSIS — B351 Tinea unguium: Secondary | ICD-10-CM | POA: Diagnosis not present

## 2021-04-23 NOTE — Progress Notes (Signed)
?  Subjective:  ?Patient ID: Matthew [name], male    DOB: [birth year],  MRN: [id number] ? ?Chief Complaint  ?Patient presents with  ? Nail Problem  ?  Nail trim   ? ?86 y.o. male returns for the above complaint.  Patient is here for painful thickened elongated mycotic toenails x10.  Patient states that he has not been able to cut his own toenails because they are gone so dystrophic.  Patient states that he is not able to cut it them.  He denies any other acute complaints ? ?Objective:  ?There were no vitals filed for this visit. ?Podiatric Exam: ?Vascular: dorsalis pedis and posterior tibial pulses are palpable bilateral. Capillary return is immediate. Temperature gradient is WNL. Skin turgor WNL  ?Sensorium: Normal Semmes Weinstein monofilament test. Normal tactile sensation bilaterally. ?Nail Exam: Pt has thick disfigured discolored nails with subungual debris noted bilateral entire nail hallux through fifth toenails ?Ulcer Exam: There is no evidence of ulcer or pre-ulcerative changes or infection. ?Orthopedic Exam: Muscle tone and strength are WNL. No limitations in general ROM. No crepitus or effusions noted. HAV  B/L.  Hammer toes 2-5  B/L. ?Skin: No Porokeratosis. No infection or ulcers.  Sweatiness without porokeratosis noted to bilateral lower extremity ? ?Assessment & Plan:  ?Patient was evaluated and treated and all questions answered. ? ?Onychomycosis with pain  ?-Nails palliatively debrided as below. ?-Educated on self-care ? ?Left hallux ingrown nail/dystrophic nail ?-I discussed with the patient extensive detail about the etiology of ingrown nail and various treatment options were discussed.  I believe patient will benefit from removal of the ingrown toenail however patient would like to hold off for now and will think about it.  We will discuss this at a later time to have it removed. ? ?Hyperhidrosis bilateral plantar feet ?-I explained to patient the etiology of hyperhidrosis and various treatment  options were extensively discussed.  Given that this is mild to moderate in nature I believe patient will benefit from [employer]bond foot powder.  I encouraged using it twice a day.  Patient states understanding will do so. ? ? ?Procedure: Nail Debridement ?Rationale: pain  ?Type of Debridement: manual, sharp debridement. ?Instrumentation: Nail nipper, rotary burr. ?Number of Nails: 10 ? ?Procedures and Treatment: Consent by patient was obtained for treatment procedures. The patient understood the discussion of treatment and procedures well. All questions were answered thoroughly reviewed. Debridement of mycotic and hypertrophic toenails, 1 through 5 bilateral and clearing of subungual debris. No ulceration, no infection noted.  ?Return Visit-Office Procedure: Patient instructed to return to the office for a follow up visit 3 months for continued evaluation and treatment. ? ?Matthew [name], DPM ?  ? ?No follow-ups on file. ? ?

## 2021-05-02 ENCOUNTER — Encounter: Payer: Self-pay | Admitting: Surgical

## 2021-05-02 ENCOUNTER — Ambulatory Visit (INDEPENDENT_AMBULATORY_CARE_PROVIDER_SITE_OTHER): Payer: Medicare HMO

## 2021-05-02 ENCOUNTER — Ambulatory Visit (INDEPENDENT_AMBULATORY_CARE_PROVIDER_SITE_OTHER): Payer: Medicare HMO | Admitting: Surgical

## 2021-05-02 DIAGNOSIS — M75111 Incomplete rotator cuff tear or rupture of right shoulder, not specified as traumatic: Secondary | ICD-10-CM | POA: Diagnosis not present

## 2021-05-02 DIAGNOSIS — M542 Cervicalgia: Secondary | ICD-10-CM

## 2021-05-03 ENCOUNTER — Encounter: Payer: Self-pay | Admitting: Surgical
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
## 2021-06-10 ENCOUNTER — Ambulatory Visit (INDEPENDENT_AMBULATORY_CARE_PROVIDER_SITE_OTHER): Payer: Medicare HMO | Admitting: Family Medicine

## 2021-06-10 ENCOUNTER — Encounter: Payer: Self-pay | Admitting: Family Medicine

## 2021-06-10 VITALS — Resp 20 | Ht 68.0 in | Wt 211.4 lb

## 2021-06-10 DIAGNOSIS — B372 Candidiasis of skin and nail: Secondary | ICD-10-CM | POA: Diagnosis not present

## 2021-06-10 DIAGNOSIS — N393 Stress incontinence (female) (male): Secondary | ICD-10-CM | POA: Diagnosis not present

## 2021-06-10 DIAGNOSIS — G4733 Obstructive sleep apnea (adult) (pediatric): Secondary | ICD-10-CM

## 2021-06-10 DIAGNOSIS — E669 Obesity, unspecified: Secondary | ICD-10-CM

## 2021-06-10 DIAGNOSIS — J300 Vasomotor rhinitis: Secondary | ICD-10-CM | POA: Diagnosis not present

## 2021-06-10 DIAGNOSIS — I10 Essential (primary) hypertension: Secondary | ICD-10-CM | POA: Diagnosis not present

## 2021-06-10 DIAGNOSIS — R739 Hyperglycemia, unspecified: Secondary | ICD-10-CM

## 2021-06-10 DIAGNOSIS — E785 Hyperlipidemia, unspecified: Secondary | ICD-10-CM

## 2021-06-10 LAB — COMPREHENSIVE METABOLIC PANEL
ALT: 20 U/L (ref 0–53)
AST: 21 U/L (ref 0–37)
Albumin: 4.1 g/dL (ref 3.5–5.2)
Alkaline Phosphatase: 39 U/L (ref 39–117)
BUN: 15 mg/dL (ref 6–23)
CO2: 28 mEq/L (ref 19–32)
Calcium: 9.8 mg/dL (ref 8.4–10.5)
Chloride: 104 mEq/L (ref 96–112)
Creatinine, Ser: 1.08 mg/dL (ref 0.40–1.50)
GFR: 63.31 mL/min (ref >60.00)
Glucose, Bld: 97 mg/dL (ref 70–99)
Potassium: 4.6 mEq/L (ref 3.5–5.1)
Sodium: 137 mEq/L (ref 135–145)
Total Bilirubin: 0.5 mg/dL (ref 0.2–1.2)
Total Protein: 6.6 g/dL (ref 6.0–8.3)

## 2021-06-10 LAB — CBC
HCT: 38.6 % — ABNORMAL LOW (ref 39.0–52.0)
Hemoglobin: 13.3 g/dL (ref 13.0–17.0)
MCHC: 34.5 g/dL (ref 30.0–36.0)
MCV: 88.4 fl (ref 78.0–100.0)
Platelets: 186 10*3/uL (ref 150.0–400.0)
RBC: 4.36 Mil/uL (ref 4.22–5.81)
RDW: 13.7 % (ref 11.5–15.5)
WBC: 8 10*3/uL (ref 4.0–10.5)

## 2021-06-10 LAB — LIPID PANEL
Cholesterol: 118 mg/dL (ref 0–200)
HDL: 44.4 mg/dL (ref >39.00)
LDL Cholesterol: 59 mg/dL (ref 0–99)
NonHDL: 73.48
Total CHOL/HDL Ratio: 3
Triglycerides: 73 mg/dL (ref 0.0–149.0)
VLDL: 14.6 mg/dL (ref 0.0–40.0)

## 2021-06-10 LAB — HEMOGLOBIN A1C: Hgb A1c MFr Bld: 6.3 % (ref 4.6–6.5)

## 2021-06-10 LAB — TSH: TSH: 1.64 u[IU]/mL (ref 0.35–5.50)

## 2021-06-10 MED ORDER — AZELASTINE HCL 0.1 % NA SOLN: 2.0000 | Freq: Two times a day (BID) | NASAL | 5 refills | Status: DC

## 2021-06-10 MED ORDER — NYSTATIN 100000 UNIT/GM EX CREA: 1.0000 "application " | TOPICAL_CREAM | Freq: Two times a day (BID) | CUTANEOUS | 2 refills | Status: DC | PRN

## 2021-06-10 NOTE — Patient Instructions (Signed)
Rotator Cuff Tear  A rotator cuff tear is a partial or complete tear of the cord-like bands (tendons) that connect muscle to bone in the rotator cuff. The rotator cuff is a group of muscles and tendons that surround the shoulder joint and keep the upper arm bone (humerus) in the shoulder socket. The tear can occur suddenly (acute tear) or can develop over a long period of time (chronic tear). What are the causes? Acute tears may be caused by: A fall, especially on an outstretched arm. Lifting very heavy objects with a jerking motion. Chronic tears may be caused by overuse of the muscles. This may happen in sports, physical work, or activities in which your arm repeatedly moves over your head. What increases the risk? This condition is more likely to occur in: Athletes and workers who frequently use their shoulder or reach over their head. This may include activities such as: Tennis. Baseball and softball. Swimming and rowing. Weight lifting. Construction work. Painting. People who smoke. Older people who have arthritis or poor blood supply. These can make the muscles and tendons weaker. What are the signs or symptoms? Symptoms of this condition depend on the type and severity of the injury: An acute tear may include a sudden tearing feeling, followed by severe pain that goes from your upper shoulder, down your arm, and toward your elbow. A chronic tear includes a gradual weakness and decreased shoulder motion as the pain gets worse. The pain is usually worse at night. Both types may have symptoms such as: Pain that spreads (radiates) from the shoulder to the upper arm. Swelling and tenderness in front of the shoulder. Decreased range of motion. Pain when: Reaching, pulling, or lifting the arm above the head. Lowering the arm from above the head. Not being able to raise your arm out to the side. Difficulty placing the arm behind your back. How is this diagnosed? This condition is  diagnosed with a medical history and physical exam. Imaging tests may also be done, including: X-rays. MRI. Ultrasound. CT or MR arthrogram. During this test, a contrast material is injected into your shoulder, and then images are taken. How is this treated? Treatment for this condition depends on the type and severity of the condition. In less severe cases, treatment may include: Rest. This may be done with a sling that holds the shoulder still (immobilization). Your health care provider may also recommend avoiding activities that involve lifting your arm over your head. Icing the shoulder. Anti-inflammatory medicines, such as aspirin or ibuprofen. Strengthening and stretching exercises. Your health care provider may recommend specific exercises to improve your range of motion and strengthen your shoulder. In more severe cases, treatment may include: Physical therapy. Steroid injections. Surgery. Follow these instructions at home: Managing pain, stiffness, and swelling     If directed, put ice on the injured area. To do this: If you have a removable sling, remove it as told by your health care provider. Put ice in a plastic bag. Place a towel between your skin and the bag. Leave the ice on for 20 minutes, 2-3 times a day. Remove the ice if your skin turns bright red. This is very important. If you cannot feel pain, heat, or cold, you have a greater risk of damage to the area. Raise (elevate) the injured area above the level of your heart while you are lying down. Find a comfortable sleeping position or sleep on a recliner, if available. Move your fingers often to reduce stiffness and   swelling. Once the swelling has gone down, your health care provider may direct you to apply heat to relax the muscles. Use the heat source that your health care provider recommends, such as a moist heat pack or a heating pad. Place a towel between your skin and the heat source. Leave the heat on for  20-30 minutes. Remove the heat if your skin turns bright red. This is especially important if you are unable to feel pain, heat, or cold. You have a greater risk of getting burned. If you have a sling: Wear the sling as told by your health care provider. Remove it only as told by your health care provider. Loosen the sling if your fingers tingle, become numb, or turn cold and blue. Keep the sling clean. If the sling is not waterproof: Do not let it get wet. Cover it with a watertight covering when you take a bath or a shower. Activity Rest your shoulder as told by your health care provider. Return to your normal activities as told by your health care provider. Ask your health care provider what activities are safe for you. Ask your health care provider when it is safe to drive if you have a sling on your arm. Do any exercises or stretches as told by your health care provider. General instructions Take over-the-counter and prescription medicines only as told by your health care provider. Do not use any products that contain nicotine or tobacco, such as cigarettes, e-cigarettes, and chewing tobacco. If you need help quitting, ask your health care provider. Keep all follow-up visits. This is important. Contact a health care provider if: Your pain gets worse. You have new pain in your arm, hands, or fingers. Medicine does not help your pain. Get help right away if: Your arm, hand, or fingers are numb or tingling. Your arm, hand, or fingers are swollen or painful or they turn white or blue. Your hand or fingers on your injured arm are colder than your other hand. Summary A rotator cuff tear is a partial or complete tear of the cord-like bands (tendons) that connect muscle to bone in the rotator cuff. The tear can occur suddenly (acute tear) or can develop over a long period of time (chronic tear). Treatment generally includes rest, anti-inflammatory medicines, and icing. In some cases,  physical therapy and steroid injections may be needed. In severe cases, surgery may be needed. This information is not intended to replace advice given to you by your health care provider. Make sure you discuss any questions you have with your health care provider. Document Revised: 05/24/2019 Document Reviewed: 05/24/2019 Elsevier Patient Education  2023 [employer] Inc[employer]  

## 2021-06-10 NOTE — Assessment & Plan Note (Signed)
Refilled Nystatin cream advised to blow dry area before dressing notify us if worsens ?

## 2021-06-10 NOTE — Assessment & Plan Note (Signed)
Using a pad with adequate response.  ?

## 2021-06-10 NOTE — Assessment & Plan Note (Signed)
Has gotten some relief with Astelin refill provided today ?
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
## 2021-06-10 NOTE — Assessment & Plan Note (Signed)
Well controlled, no changes to meds. Encouraged heart healthy diet such as the DASH diet and exercise as tolerated.  °

## 2021-06-10 NOTE — Assessment & Plan Note (Addendum)
Is not using never felt it helped much. He is offered referral for repeat sleep study and treatment but declines at this time.   ?

## 2021-06-10 NOTE — Assessment & Plan Note (Signed)
Encouraged DASH or MIND diet, decrease po intake and increase exercise as tolerated. Needs 7-8 hours of sleep nightly. Avoid trans fats, eat small, frequent meals every 4-5 hours with lean proteins, complex carbs and healthy fats. Minimize simple carbs, high fat foods and processed foods 

## 2021-07-04 ENCOUNTER — Telehealth: Payer: Self-pay | Admitting: Cardiology

## 2021-07-04 NOTE — Telephone Encounter (Signed)
*  STAT* If patient is at the pharmacy, call can be transferred to refill team.   1. Which medications need to be refilled? (please list name of each medication and dose if known)  spironolactone (ALDACTONE) 25 MG tablet metoprolol succinate (TOPROL-XL) 25 MG 24 hr tablet isosorbide mononitrate (IMDUR) 30 MG 24 hr tablet   2. Which pharmacy/location (including street and city if local pharmacy) is medication to be sent to? [facility] - [place], [place] - [address]  3. Do they need a 30 day or 90 day supply? [place]

## 2021-07-07 ENCOUNTER — Telehealth: Payer: Self-pay | Admitting: Gastroenterology

## 2021-07-07 MED ORDER — FAMOTIDINE 40 MG PO TABS: ORAL_TABLET | ORAL | 1 refills | Status: DC

## 2021-07-07 NOTE — Telephone Encounter (Signed)
Left message on voicemail that Rx for famotidine was sent to [facility].

## 2021-07-07 NOTE — Telephone Encounter (Signed)
Inbound call from patient stating he needs a refill for Famotidine. Please advise.

## 2021-07-08 MED ORDER — SPIRONOLACTONE 25 MG PO TABS: 12.5000 mg | ORAL_TABLET | Freq: Every day | ORAL | 1 refills | Status: DC

## 2021-07-08 MED ORDER — METOPROLOL SUCCINATE ER 25 MG PO TB24
12.5000 mg | ORAL_TABLET | Freq: Every day | ORAL | 1 refills | Status: DC
Start: 2021-07-08 — End: 2021-09-29

## 2021-07-08 MED ORDER — ISOSORBIDE MONONITRATE ER 30 MG PO TB24: 15.0000 mg | ORAL_TABLET | Freq: Every day | ORAL | 1 refills | Status: DC

## 2021-07-15 ENCOUNTER — Other Ambulatory Visit: Payer: Self-pay | Admitting: Cardiology

## 2021-07-15 DIAGNOSIS — I2510 Atherosclerotic heart disease of native coronary artery without angina pectoris: Secondary | ICD-10-CM

## 2021-07-15 DIAGNOSIS — E785 Hyperlipidemia, unspecified: Secondary | ICD-10-CM

## 2021-07-23 ENCOUNTER — Encounter: Payer: Self-pay | Admitting: Podiatry

## 2021-07-23 ENCOUNTER — Ambulatory Visit: Payer: Medicare HMO | Admitting: Podiatry

## 2021-07-23 ENCOUNTER — Ambulatory Visit (INDEPENDENT_AMBULATORY_CARE_PROVIDER_SITE_OTHER): Payer: Medicare HMO | Admitting: Podiatry

## 2021-07-23 DIAGNOSIS — M79675 Pain in left toe(s): Secondary | ICD-10-CM

## 2021-07-23 DIAGNOSIS — B351 Tinea unguium: Secondary | ICD-10-CM | POA: Diagnosis not present

## 2021-07-23 DIAGNOSIS — M79674 Pain in right toe(s): Secondary | ICD-10-CM | POA: Diagnosis not present

## 2021-07-23 DIAGNOSIS — L603 Nail dystrophy: Secondary | ICD-10-CM | POA: Diagnosis not present

## 2021-07-23 NOTE — Progress Notes (Signed)
This patient presents to the office with chief complaint of long thick painful nails.  Patient says the nails are painful walking and wearing shoes.  This patient is unable to self treat.  This patient is unable to trim his nails since she is unable to reach his nails.  She presents to the office for preventative foot care services.  General Appearance  Alert, conversant and in no acute stress.  Vascular  Dorsalis pedis and posterior tibial  pulses are palpable  bilaterally.  Capillary return is within normal limits  bilaterally. Temperature is within normal limits  bilaterally.  Neurologic  Senn-Weinstein monofilament wire test within normal limits  bilaterally. Muscle power within normal limits bilaterally.  Nails Thick disfigured discolored nails with subungual debris  from hallux to fifth toes bilaterally. No evidence of bacterial infection or drainage bilaterally.  Orthopedic  No limitations of motion  feet .  No crepitus or effusions noted.  No bony pathology or digital deformities noted.  Skin  normotropic skin with no porokeratosis noted bilaterally.  No signs of infections or ulcers noted.     Onychomycosis  Nails  B/L.  Pain in right toes  Pain in left toes  Debridement of nails both feet followed trimming the nails with dremel tool.    RTC 3 months.   Anntonette Madewell DPM   

## 2021-08-11 DIAGNOSIS — Z85828 Personal history of other malignant neoplasm of skin: Secondary | ICD-10-CM | POA: Diagnosis not present

## 2021-08-11 DIAGNOSIS — C44329 Squamous cell carcinoma of skin of other parts of face: Secondary | ICD-10-CM | POA: Diagnosis not present

## 2021-08-11 DIAGNOSIS — D485 Neoplasm of uncertain behavior of skin: Secondary | ICD-10-CM | POA: Diagnosis not present

## 2021-08-20 ENCOUNTER — Other Ambulatory Visit: Payer: Self-pay | Admitting: Family Medicine

## 2021-09-10 ENCOUNTER — Encounter: Payer: Self-pay | Admitting: Gastroenterology

## 2021-09-15 ENCOUNTER — Ambulatory Visit: Payer: Medicare HMO | Admitting: Gastroenterology

## 2021-09-18 ENCOUNTER — Encounter: Payer: Self-pay | Admitting: Cardiology

## 2021-09-18 ENCOUNTER — Ambulatory Visit: Payer: Medicare HMO | Admitting: Cardiology

## 2021-09-18 VITALS — BP 118/70 | HR 70 | Resp 20 | Ht 68.0 in | Wt 215.0 lb

## 2021-09-18 DIAGNOSIS — E782 Mixed hyperlipidemia: Secondary | ICD-10-CM

## 2021-09-18 DIAGNOSIS — I2510 Atherosclerotic heart disease of native coronary artery without angina pectoris: Secondary | ICD-10-CM | POA: Diagnosis not present

## 2021-09-18 DIAGNOSIS — R0789 Other chest pain: Secondary | ICD-10-CM

## 2021-09-18 NOTE — Patient Instructions (Signed)
Medication Instructions:  Your physician recommends that you continue on your current medications as directed. Please refer to the Current Medication list given to you today.  *If you need a refill on your cardiac medications before your next appointment, please call your pharmacy*   Lab Work: None If you have labs (blood work) drawn today and your tests are completely normal, you will receive your results only by: MyChart Message (if you have MyChart) OR A paper copy in the mail If you have any lab test that is abnormal or we need to change your treatment, we will call you to review the results.   Testing/Procedures: None   Follow-Up: At [facility], you and your health needs are our priority.  As part of our continuing mission to provide you with exceptional heart care, we have created designated Provider Care Teams.  These Care Teams include your primary Cardiologist (physician) and Advanced Practice Providers (APPs -  Physician Assistants and Nurse Practitioners) who all work together to provide you with the care you need, when you need it.  We recommend signing up for the patient portal called "MyChart".  Sign up information is provided on this After Visit Summary.  MyChart is used to connect with patients for Virtual Visits (Telemedicine).  Patients are able to view lab/test results, encounter notes, upcoming appointments, etc.  Non-urgent messages can be sent to your provider as well.   To learn more about what you can do with MyChart, go to [email or web address].    Your next appointment:   1 year(s)  The format for your next appointment:   In Person  Provider:   Kardie Tobb, DO     Other Instructions   Important Information About Sugar       
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
## 2021-09-29 ENCOUNTER — Other Ambulatory Visit: Payer: Self-pay | Admitting: Cardiology

## 2021-09-29 NOTE — Telephone Encounter (Signed)
Rx refill sent to pharmacy. 

## 2021-10-12 NOTE — Progress Notes (Unsigned)
10/12/2021 Matthew [name] [id number] 03-Dec-1935   Chief Complaint: Increased phlegm, refill Famotidine  History of Present Illness: Matthew [name] is an 86 year old male with a past medical history of hypertension, aortic stenosis, hyperlipidemia, prostate cancer 2009 s/p robotic assisted laparoscopic radical prostatectomy 12/2007. He was last seen by Dr. Ardis Hughs 09/17/2020, at that time his belching was much better after stopping Pantoprazole and taking Famotidine '40mg'$   bid.  He presents today for further evaluation regarding increased phlegm.  He describes coughing up a small amount of yellow phlegm in the morning then coughs up clear phlegm x 3 later in the day.  No noticeable increase in throat clearing.  No hemoptysis.  No shortness of breath.  He has intermittent burping but overall feels his burping has improved when compared to 1 or 2 years ago.  No heartburn or dysphagia.  No upper or lower abdominal pain.  He is passing normal formed brown stool daily.  No rectal bleeding or black stools.  He and his wife moved into an assisted living facility less than 1 year ago.  He notices more phlegm activity when he is in his apartment.  He denies having any pets.  His apartment does have wall-to-wall carpeting.  He has a history of seasonal/environmental allergies in the past for which he takes Astelin nasal spray as needed.  He underwent an EGD 06/2020 which showed nonspecific gastritis, several fundic gland polyps and a small hiatal hernia.  His most recent colonoscopy was in 2006 which he reported was normal. He completed a Cologuard test  03/21/2015 which was negative.  No family history of colorectal cancer. No other complaints at this time.     Latest Ref Rng & Units 06/10/2021   11:52 AM 12/09/2020   10:54 AM 06/04/2020   11:10 AM  CBC  WBC 4.0 - 10.5 K/uL 8.0  7.6  9.9   Hemoglobin 13.0 - 17.0 g/dL 13.3  12.8  13.0   Hematocrit 39.0 - 52.0 % 38.6  37.9  37.9   Platelets 150.0 - 400.0 K/uL  186.0  206.0  216.0        Latest Ref Rng & Units 06/10/2021   11:52 AM 12/24/2020    8:21 AM 12/09/2020   10:54 AM  CMP  Glucose 70 - 99 mg/dL 97  130  101   BUN 6 - 23 mg/dL '15  16  18   '$ Creatinine 0.40 - 1.50 mg/dL 1.08  1.11  0.98   Sodium 135 - 145 mEq/L 137  132  132   Potassium 3.5 - 5.1 mEq/L 4.6  4.0  4.5   Chloride 96 - 112 mEq/L 104  97  98   CO2 19 - 32 mEq/L '28  27  28   '$ Calcium 8.4 - 10.5 mg/dL 9.8  9.6  9.5   Total Protein 6.0 - 8.3 g/dL 6.6  6.7  6.7   Total Bilirubin 0.2 - 1.2 mg/dL 0.5  0.6  0.6   Alkaline Phos 39 - 117 U/L 39  47  43   AST 0 - 37 U/L '21  23  21   '$ ALT 0 - 53 U/L '20  18  16      '$ ECHO 01/11/2019:  1. Left ventricular ejection fraction, by visual estimation, is 55 to 60%. The left ventricle has normal function. There is mildly increased left ventricular hypertrophy. 2. Left ventricular diastolic parameters are consistent with Grade I diastolic dysfunction (impaired relaxation). 3.  Global right ventricle has normal systolic function.The right ventricular size is normal. No increase in right ventricular wall thickness. 4. Left atrial size was normal. 5. Right atrial size was normal. 6. The mitral valve is normal in structure. No evidence of mitral valve regurgitation. No evidence of mitral stenosis. 7. The tricuspid valve is normal in structure. Tricuspid valve regurgitation is trivial. 8. The aortic valve is mildly thickened and mildy calcified. There is mild aortic annular calcification. Aortic valve regurgitation is not visualized. Mild to moderate aortic valve stenosis. AVA by VTI 1.4 cmsq, Mean gradient 10 mmHg, Peak velocity 2.2 m/s. 9. The pulmonic valve was not well visualized. Pulmonic valve regurgitation is not visualized. 10. Normal pulmonary artery systolic pressure. 11. In comparison to study done in 04/2015 there is now mild to moderate Aortic stenosis   Current Outpatient Medications on File Prior to Visit  Medication Sig Dispense  Refill   Ascorbic Acid (VITAMIN C) 1000 MG tablet Take 1,000 mg by mouth daily.     ASPIRIN ADULT LOW STRENGTH 81 MG EC tablet TAKE ONE (1) TABLET BY MOUTH EVERY. DAY.SWALLOW WHOLE. 90 tablet 3   azelastine (ASTELIN) 0.1 % nasal spray Place 2 sprays into both nostrils 2 (two) times daily. Use in each nostril as directed 30 mL 5   Cholecalciferol (VITAMIN D3) 50 MCG (2000 UT) TABS Take 2,000 Units by mouth daily.     ezetimibe (ZETIA) 10 MG tablet TAKE 1 TABLET BY MOUTH DAILY 90 tablet 3   fenofibrate micronized (LOFIBRA) 134 MG capsule TAKE 1 CAPSULE EVERY DAY BEFORE BREAKFAST 90 capsule 2   isosorbide mononitrate (IMDUR) 30 MG 24 hr tablet Take 0.5 tablets (15 mg total) by mouth daily. 45 tablet 1   losartan (COZAAR) 25 MG tablet TAKE 1 TABLET EVERY DAY 90 tablet 1   metoprolol succinate (TOPROL-XL) 25 MG 24 hr tablet TAKE 1/2 TABLET BY MOUTH DAILY 45 tablet 3   Multiple Vitamin (MULTIVITAMIN WITH MINERALS) TABS tablet Take 1 tablet by mouth daily.     REPATHA SURECLICK 607 MG/ML SOAJ INJECT 2MLS INTO THE SKIN EVERY 14 DAYS 2 mL 11   nitroGLYCERIN (NITROSTAT) 0.4 MG SL tablet Place 0.4 mg under the tongue every 5 (five) minutes as needed for chest pain. (Patient not taking: Reported on 10/13/2021)     nystatin cream (MYCOSTATIN) Apply 1 application. topically 2 (two) times daily as needed for dry skin. (Patient not taking: Reported on 10/13/2021) 30 g 2   spironolactone (ALDACTONE) 25 MG tablet Take 0.5 tablets (12.5 mg total) by mouth daily. 45 tablet 1   No current facility-administered medications on file prior to visit.   Allergies  Allergen Reactions   Amlodipine     unknown   Atorvastatin Other (See Comments)    Muscle cramps   Clindamycin/Lincomycin Other (See Comments)    Hives.   Erythromycin     REACTION: hives   Levofloxacin     REACTION: joint swelling   Lisinopril Cough   Penicillins     ?unknown reaction   Rosuvastatin Calcium    Sulfonamide Derivatives     REACTION:  swelling   Tetracycline Hcl     REACTION: unspecified    Current Medications, Allergies, Past Medical History, Past Surgical History, Family History and Social History were reviewed in [employer] record.  Review of Systems:   Constitutional: Negative for fever, sweats, chills or weight loss.  Respiratory: Negative for shortness of breath.   Cardiovascular: Negative for chest pain,  palpitations and leg swelling.  Gastrointestinal: See HPI.  Musculoskeletal: Negative for back pain or muscle aches.  Neurological: Negative for dizziness, headaches or paresthesias.    Physical Exam: BP 122/68   Pulse 74   Ht '5\' 8"'$  (1.727 m)   Wt 212 lb 6.4 oz (96.3 kg)   SpO2 97%   BMI 32.30 kg/m  Wt Readings from Last 3 Encounters:  10/13/21 212 lb 6.4 oz (96.3 kg)  09/18/21 215 lb (97.5 kg)  06/10/21 211 lb 6.4 oz (95.9 kg)   General: 86 year old male in no acute distress. Head: Normocephalic and atraumatic. Eyes: No scleral icterus. Conjunctiva pink . Ears: Normal auditory acuity. Mouth: Dentition intact. No ulcers or lesions.  Lungs: Clear throughout to auscultation. Heart: Regular rate and rhythm. Systolic murmur. Abdomen: Soft, nontender and nondistended. No masses or hepatomegaly. Normal bowel sounds x 4 quadrants.  Rectal: Deferred. Musculoskeletal: Symmetrical with no gross deformities. Extremities: No edema. Neurological: Alert oriented x 4. No focal deficits.  Psychological: Alert and cooperative. Normal mood and affect  Assessment and Recommendations:  72) 86 year old male with history of GERD with yellow phlegm upon awakening in the morning followed by clear phlegm production later in the day.  I suspect his phlegm production is secondary to postnasal drainage/environmentals versus seasonal allergies and less likely waterbrash/reflux related. -Recommend follow-up with Dr. Charlett Blake to consider further postnasal drainage/allergy treatment, possible future  ENT/allergist consults -Continue Famotidine 40 mg twice daily -GERD diet recommended -Patient to contact office if symptoms persist or worsen  2) History of prostate cancer    3) Questionable  history of colon polyps.  Last colonoscopy in 2006, reported as normal (procedure report not found in Epic). Negative Cologuard test 03/21/2015. -No further colonoscopy recommended due to age unless medically necessary

## 2021-10-13 ENCOUNTER — Ambulatory Visit: Payer: Medicare HMO | Admitting: Nurse Practitioner

## 2021-10-13 ENCOUNTER — Encounter: Payer: Self-pay | Admitting: Nurse Practitioner

## 2021-10-13 VITALS — BP 122/68 | HR 74 | Ht 68.0 in | Wt 212.4 lb

## 2021-10-13 DIAGNOSIS — K219 Gastro-esophageal reflux disease without esophagitis: Secondary | ICD-10-CM | POA: Diagnosis not present

## 2021-10-13 MED ORDER — FAMOTIDINE 40 MG PO TABS: ORAL_TABLET | ORAL | 1 refills | Status: DC

## 2021-10-13 NOTE — Progress Notes (Signed)
I agree with the assessment and plan as outlined by Ms. Kennedy-Smith. 

## 2021-10-13 NOTE — Patient Instructions (Signed)
If you are age 84 or older, your body mass index should be between 23-30. Your Body mass index is 32.3 kg/m. If this is out of the aforementioned range listed, please consider follow up with your Primary Care Provider.  If you are age 10 or younger, your body mass index should be between 19-25. Your Body mass index is 32.3 kg/m. If this is out of the aformentioned range listed, please consider follow up with your Primary Care Provider.   ________________________________________________________  The [facility] providers would like to encourage you to use [facility] to communicate with providers for non-urgent requests or questions.  Due to long hold times on the telephone, sending your provider a message by [facility] may be a faster and more efficient way to get a response.  Please allow 48 business hours for a response.  Please remember that this is for non-urgent requests.  _______________________________________________________   Continue Famotidine twice daily.   Follow up with primary care regarding postnasal drainage and possible allergies.  Follow up as needed.  It was a pleasure to see you today!  Thank you for trusting me with your gastrointestinal care!

## 2021-10-16 ENCOUNTER — Ambulatory Visit: Payer: Medicare HMO | Admitting: Family Medicine

## 2021-10-27 ENCOUNTER — Ambulatory Visit: Payer: Medicare HMO | Admitting: Podiatry

## 2021-10-27 ENCOUNTER — Encounter: Payer: Self-pay | Admitting: Podiatry

## 2021-10-27 DIAGNOSIS — B351 Tinea unguium: Secondary | ICD-10-CM

## 2021-10-27 DIAGNOSIS — L603 Nail dystrophy: Secondary | ICD-10-CM

## 2021-10-27 DIAGNOSIS — M79674 Pain in right toe(s): Secondary | ICD-10-CM | POA: Diagnosis not present

## 2021-10-27 DIAGNOSIS — M79675 Pain in left toe(s): Secondary | ICD-10-CM | POA: Diagnosis not present

## 2021-10-27 NOTE — Progress Notes (Signed)
This patient presents to the office with chief complaint of long thick painful nails.  Patient says the nails are painful walking and wearing shoes.  This patient is unable to self treat.  This patient is unable to trim his nails since she is unable to reach his nails.  She presents to the office for preventative foot care services.  General Appearance  Alert, conversant and in no acute stress.  Vascular  Dorsalis pedis and posterior tibial  pulses are palpable  bilaterally.  Capillary return is within normal limits  bilaterally. Temperature is within normal limits  bilaterally.  Neurologic  Senn-Weinstein monofilament wire test within normal limits  bilaterally. Muscle power within normal limits bilaterally.  Nails Thick disfigured discolored nails with subungual debris  from hallux to fifth toes bilaterally. No evidence of bacterial infection or drainage bilaterally.  Orthopedic  No limitations of motion  feet .  No crepitus or effusions noted.  No bony pathology or digital deformities noted.  Skin  normotropic skin with no porokeratosis noted bilaterally.  No signs of infections or ulcers noted.     Onychomycosis  Nails  B/L.  Pain in right toes  Pain in left toes  Debridement of nails both feet followed trimming the nails with dremel tool.    RTC 3 months.   Rainey Kahrs DPM   

## 2021-10-29 DIAGNOSIS — G720 Drug-induced myopathy: Secondary | ICD-10-CM | POA: Insufficient documentation

## 2021-10-29 NOTE — Assessment & Plan Note (Signed)
Suffered from muscle cramps while on statins which resolved after stopping them

## 2021-10-29 NOTE — Assessment & Plan Note (Signed)
Well controlled, no changes to meds. Encouraged heart healthy diet such as the DASH diet and exercise as tolerated.  °

## 2021-10-29 NOTE — Assessment & Plan Note (Signed)
Encourage heart healthy diet such as MIND or DASH diet, increase exercise, avoid trans fats, simple carbohydrates and processed foods, consider a krill or fish or flaxseed oil cap daily. Unable to tolerate statins

## 2021-10-29 NOTE — Assessment & Plan Note (Addendum)
hgba1c acceptable, minimize simple carbs. Increase exercise as tolerated. Given flu shot today 

## 2021-10-30 ENCOUNTER — Encounter: Payer: Self-pay | Admitting: Family Medicine

## 2021-10-30 ENCOUNTER — Ambulatory Visit (INDEPENDENT_AMBULATORY_CARE_PROVIDER_SITE_OTHER): Payer: Medicare HMO | Admitting: Family Medicine

## 2021-10-30 DIAGNOSIS — I10 Essential (primary) hypertension: Secondary | ICD-10-CM | POA: Diagnosis not present

## 2021-10-30 DIAGNOSIS — N644 Mastodynia: Secondary | ICD-10-CM

## 2021-10-30 DIAGNOSIS — Z23 Encounter for immunization: Secondary | ICD-10-CM

## 2021-10-30 DIAGNOSIS — E785 Hyperlipidemia, unspecified: Secondary | ICD-10-CM | POA: Diagnosis not present

## 2021-10-30 DIAGNOSIS — G720 Drug-induced myopathy: Secondary | ICD-10-CM

## 2021-10-30 DIAGNOSIS — T466X5A Adverse effect of antihyperlipidemic and antiarteriosclerotic drugs, initial encounter: Secondary | ICD-10-CM | POA: Diagnosis not present

## 2021-10-30 DIAGNOSIS — R739 Hyperglycemia, unspecified: Secondary | ICD-10-CM | POA: Diagnosis not present
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
## 2021-10-30 NOTE — Patient Instructions (Signed)
RSV (respiratory syncitial virus) vaccine at pharmacy, Arexvy Covid booster new version out late September At pharmacy High dose flu shot mid Sept to mid Oct, given today

## 2021-10-31 LAB — LIPID PANEL
Cholesterol: 111 mg/dL (ref 0–200)
HDL: 39.4 mg/dL (ref >39.00)
LDL Cholesterol: 52 mg/dL (ref 0–99)
NonHDL: 71.55
Total CHOL/HDL Ratio: 3
Triglycerides: 96 mg/dL (ref 0.0–149.0)
VLDL: 19.2 mg/dL (ref 0.0–40.0)

## 2021-10-31 LAB — CBC
HCT: 38 % — ABNORMAL LOW (ref 39.0–52.0)
Hemoglobin: 13.2 g/dL (ref 13.0–17.0)
MCHC: 34.7 g/dL (ref 30.0–36.0)
MCV: 88.4 fl (ref 78.0–100.0)
Platelets: 210 10*3/uL (ref 150.0–400.0)
RBC: 4.3 Mil/uL (ref 4.22–5.81)
RDW: 13.8 % (ref 11.5–15.5)
WBC: 8.5 10*3/uL (ref 4.0–10.5)

## 2021-10-31 LAB — COMPREHENSIVE METABOLIC PANEL
ALT: 19 U/L (ref 0–53)
AST: 22 U/L (ref 0–37)
Albumin: 4 g/dL (ref 3.5–5.2)
Alkaline Phosphatase: 40 U/L (ref 39–117)
BUN: 19 mg/dL (ref 6–23)
CO2: 27 mEq/L (ref 19–32)
Calcium: 9.7 mg/dL (ref 8.4–10.5)
Chloride: 103 mEq/L (ref 96–112)
Creatinine, Ser: 1.23 mg/dL (ref 0.40–1.50)
GFR: 54.01 mL/min — ABNORMAL LOW (ref >60.00)
Glucose, Bld: 97 mg/dL (ref 70–99)
Potassium: 4.3 mEq/L (ref 3.5–5.1)
Sodium: 137 mEq/L (ref 135–145)
Total Bilirubin: 0.5 mg/dL (ref 0.2–1.2)
Total Protein: 7 g/dL (ref 6.0–8.3)

## 2021-10-31 LAB — TSH: TSH: 2.11 u[IU]/mL (ref 0.35–5.50)

## 2021-10-31 LAB — HEMOGLOBIN A1C: Hgb A1c MFr Bld: 6.6 % — ABNORMAL HIGH (ref 4.6–6.5)

## 2021-11-06 IMAGING — RF DG ESOPHAGUS
7 of 9 series · 12 of 18 positions shown · non-contrast
Comparison: None.

CLINICAL DATA: Chest pain, burping

EXAM:
ESOPHOGRAM/BARIUM SWALLOW
TECHNIQUE: Combined double contrast and single contrast examination performed
using effervescent crystals, thick barium liquid, and thin barium
liquid.
FLUOROSCOPY TIME:  Fluoroscopy Time:  54 seconds
Radiation Exposure Index (if provided by the fluoroscopic device):
12.5 mGy

[Series 1: cp_standard · 0.51mm/px · 3 of 127 frames shown (1 of 7)]
[frame 3/127]
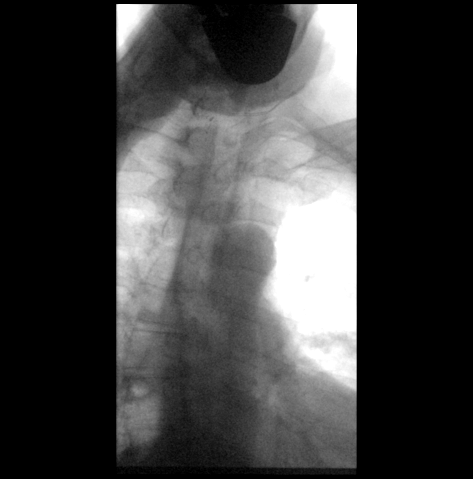
[frame 64/127]
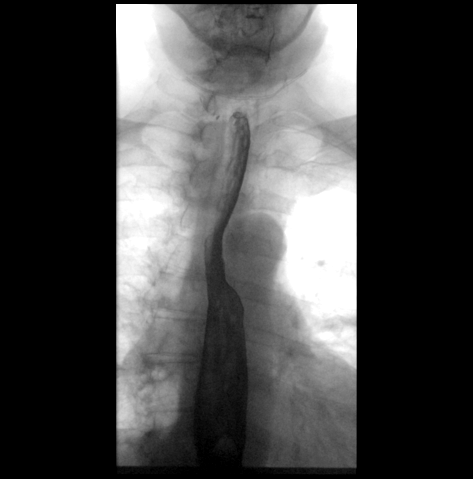
[frame 108/127]
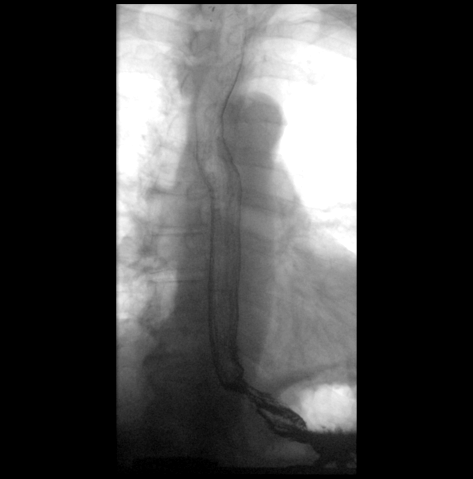

[Series 2: cp_standard · 0.51mm/px · 2 of 140 frames shown (2 of 7)]
[frame 22/140]
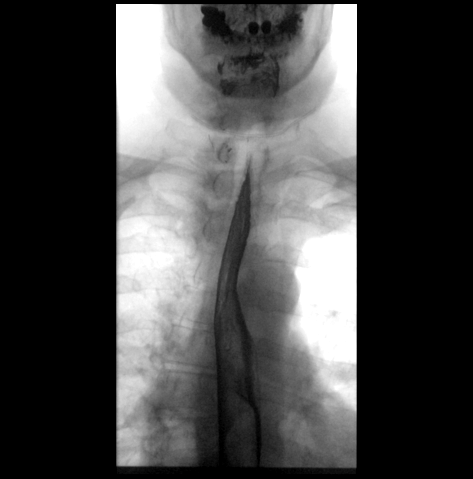
[frame 71/140]
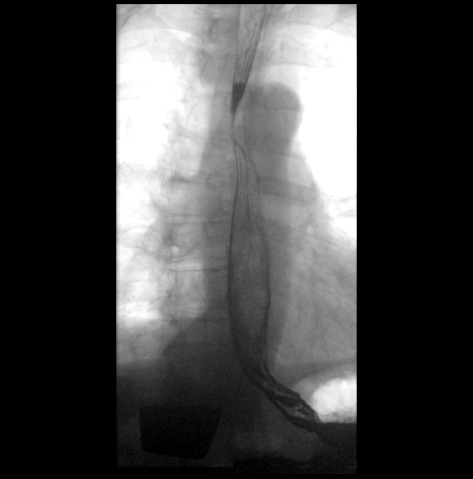

[Series 3: cp_standard · 0.51mm/px · 3 of 124 frames shown (3 of 7)]
[frame 19/124]
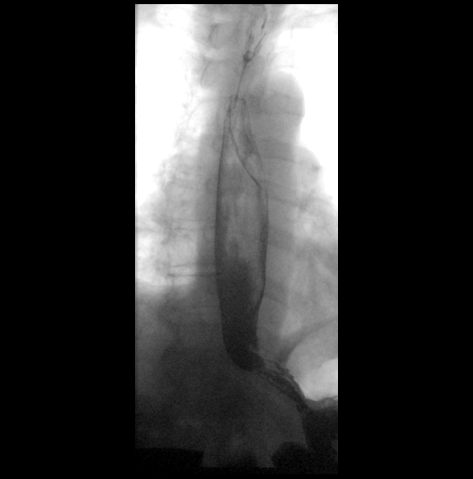
[frame 63/124]
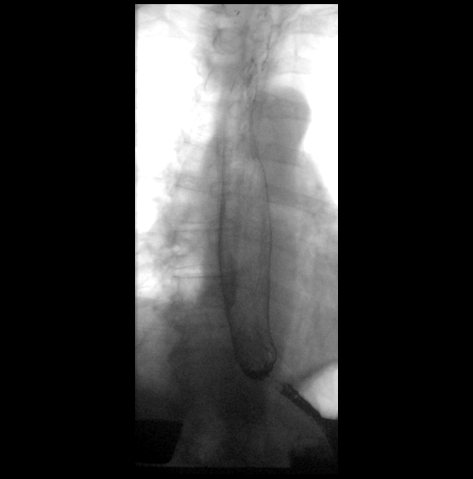
[frame 115/124]
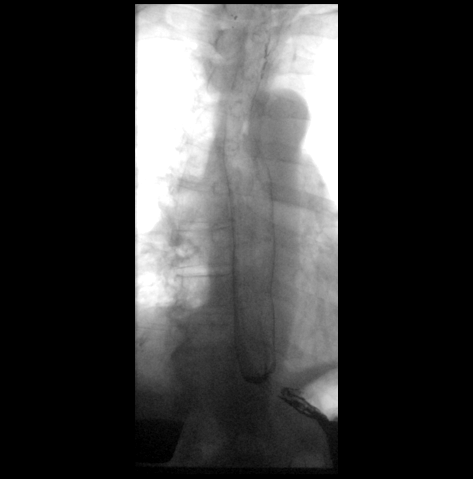

[Series 4: cp_standard · 0.28mm/px · 1 of 1 slices shown (4 of 7)]
[im 1/1]
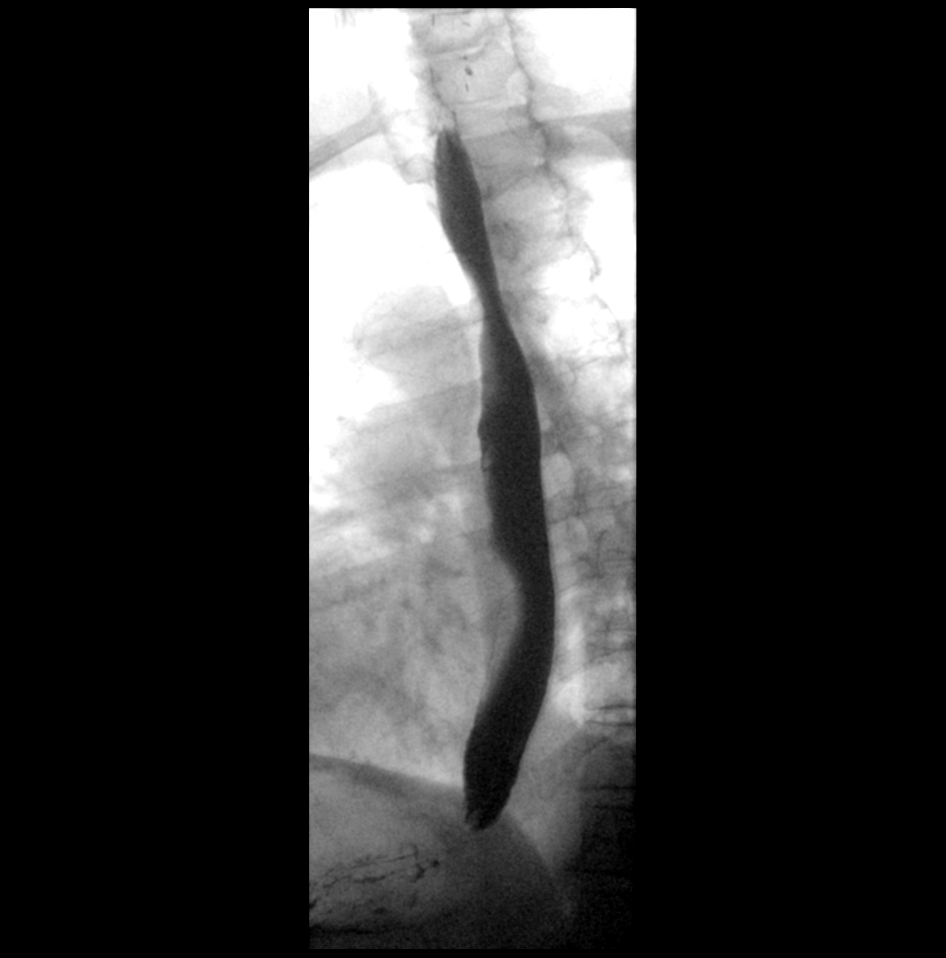

[Series 6: cp_standard · 0.28mm/px · 1 of 1 slices shown (5 of 7)]
[im 1/1]
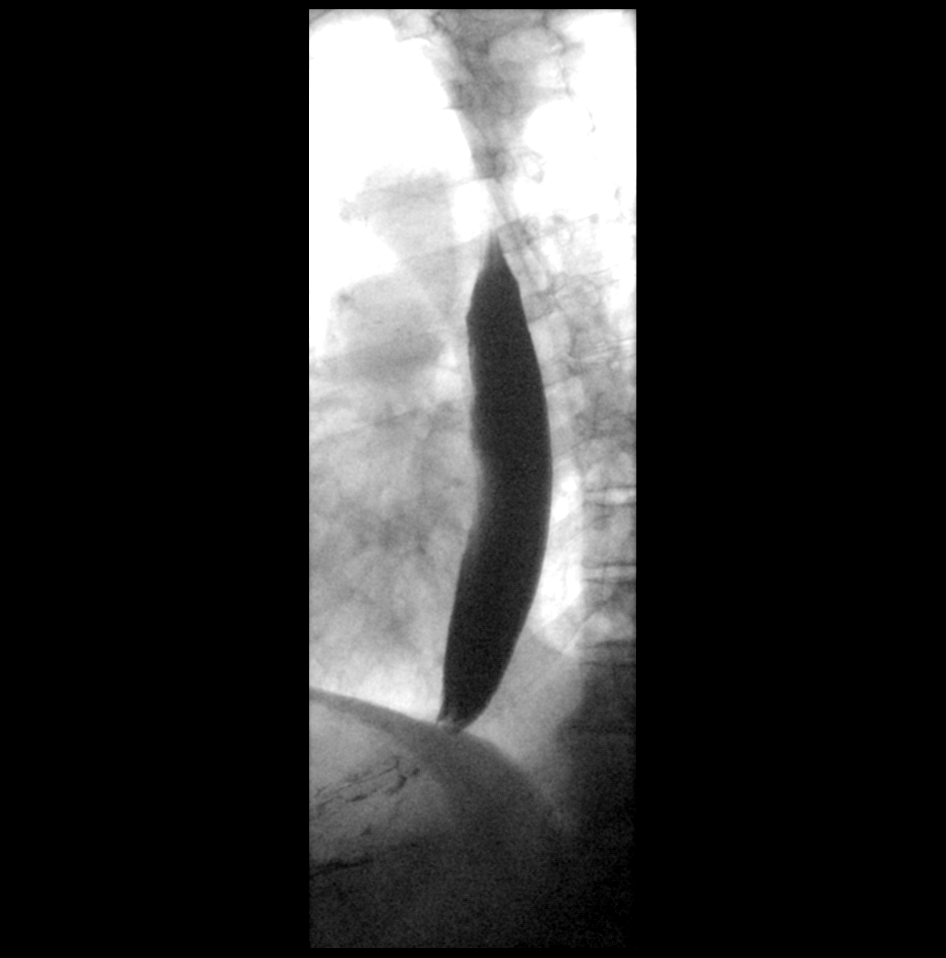

[Series 7: cp_standard · 0.28mm/px · 1 of 1 slices shown (6 of 7)]
[im 1/1]
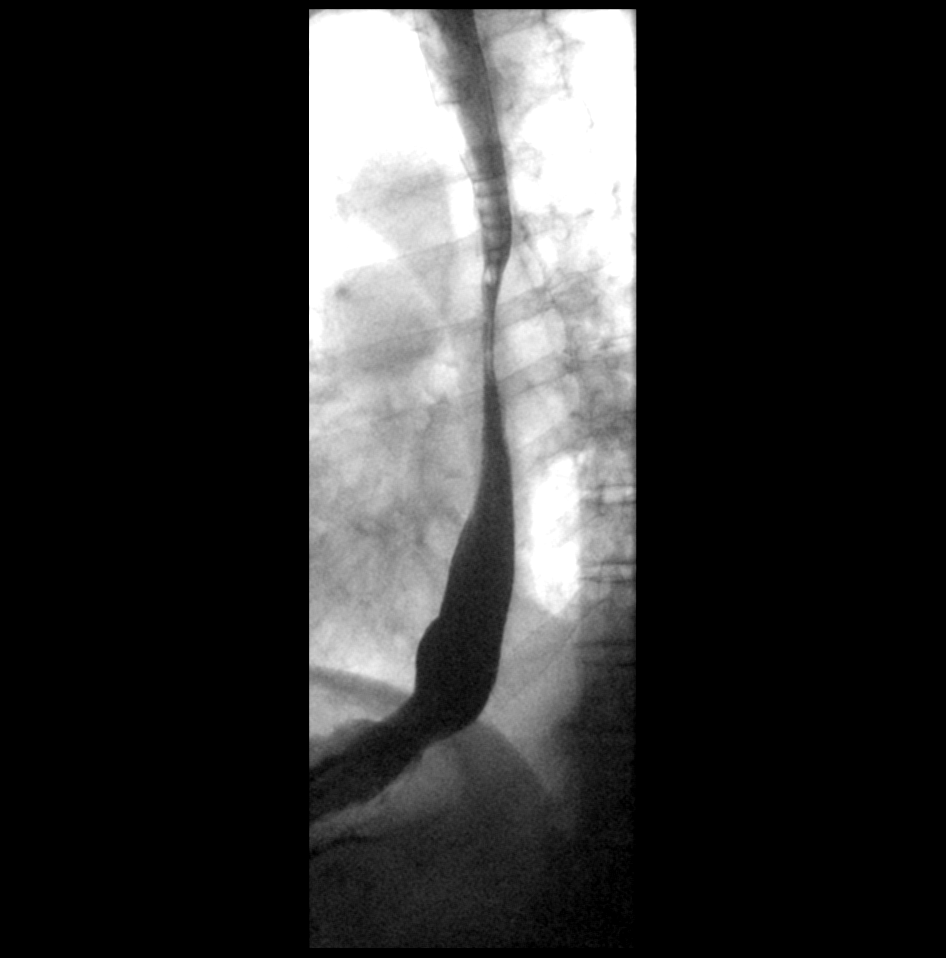

[Series 9: cp_standard · 0.30mm/px · 1 of 1 slices shown (7 of 7)]
[im 1/1]
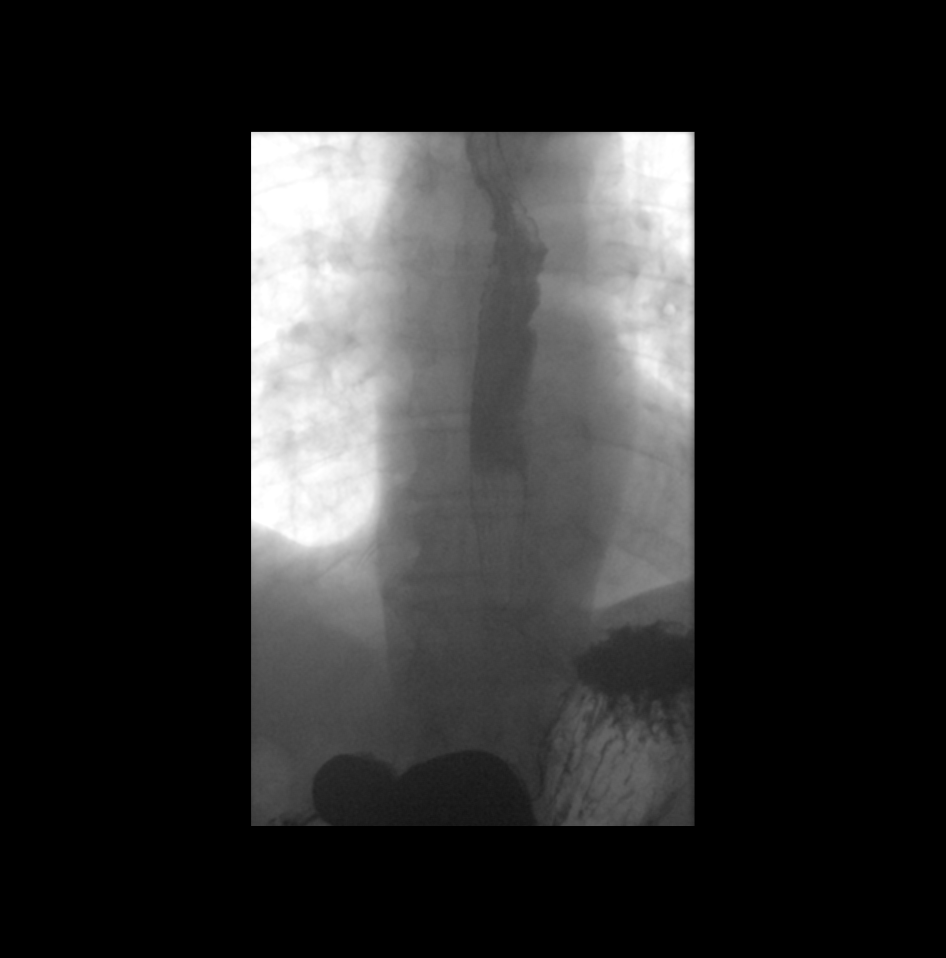

[12 of 18 positions shown; findings below may reference images not displayed]

FINDINGS: Patient swallowed barium without difficulty. There is no mass,
stricture, or other abnormality identified. Normal motility. No
hiatal hernia. There is spontaneous gastroesophageal reflux.
IMPRESSION: Gastroesophageal reflux.  Otherwise unremarkable study.

## 2021-11-07 ENCOUNTER — Telehealth: Payer: Self-pay | Admitting: Cardiology

## 2021-11-07 NOTE — Telephone Encounter (Signed)
Patient stated he cannot afford Repatha $130.00 per month. He is applying for assistance through [place]. He wants to know if he can take another drug to lower his cholesterol. Please advise.

## 2021-11-07 NOTE — Telephone Encounter (Signed)
Informed patient of PharmD reply: His only cheaper option would be to re-challenge with a statin.  Cost of Repatha will go back down after Jan 1.  Patient stated he is allergic to statins. He will wait for assistance and will go back on Repatha in January.

## 2021-11-07 NOTE — Telephone Encounter (Signed)
Pt c/o medication issue:  1. Name of Medication:   REPATHA SURECLICK 810 MG/ML SOAJ    2. How are you currently taking this medication (dosage and times per day)? : INJECT 2MLS INTO THE SKIN EVERY 14 DAYS  3. Are you having a reaction (difficulty breathing--STAT)?   4. What is your medication issue? Pt calling stating his medication used to be $45 a month. He ordered more yesterday and now it is $130 a month, pt states he cannot afford that, please advise.

## 2021-11-07 NOTE — Telephone Encounter (Signed)
His only cheaper option would be to re-challenge with a statin.  Cost of Repatha will go back down after Jan 1.

## 2021-11-17 ENCOUNTER — Telehealth: Payer: Self-pay | Admitting: Family Medicine

## 2021-11-17 NOTE — Telephone Encounter (Signed)
Pt states pcp advised him to get imaging done on his chest/breast area. Please advise pt when orders are in.

## 2021-11-18 ENCOUNTER — Other Ambulatory Visit: Payer: Self-pay | Admitting: Family Medicine

## 2021-11-18 DIAGNOSIS — N644 Mastodynia: Secondary | ICD-10-CM | POA: Insufficient documentation

## 2021-11-18 NOTE — Assessment & Plan Note (Signed)
Mammogram, ultrasound ordered.

## 2021-11-18 NOTE — Telephone Encounter (Signed)
Called pt was advised  

## 2021-11-18 NOTE — Addendum Note (Signed)
Addended by: Penni Homans A on: 11/18/2021 08:07 AM   Modules accepted: Orders

## 2021-11-21 ENCOUNTER — Telehealth: Payer: Self-pay | Admitting: Cardiology

## 2021-11-21 NOTE — Telephone Encounter (Signed)
Pt c/o medication issue:  1. Name of Medication: REPATHA SURECLICK 734 MG/ML SOAJ  2. How are you currently taking this medication (dosage and times per day)? INJECT 2MLS INTO THE SKIN EVERY 14 DAYS  3. Are you having a reaction (difficulty breathing--STAT)? No  4. What is your medication issue? Pt would like a callback regarding paperwork for medication needing to be faxed [facility]. Please advise

## 2021-11-21 NOTE — Telephone Encounter (Signed)
Returned call to patient-patient wanted to confirm we were aware to place the "1" before the fax # in order to go through.

## 2021-12-03 DIAGNOSIS — L821 Other seborrheic keratosis: Secondary | ICD-10-CM | POA: Diagnosis not present

## 2021-12-03 DIAGNOSIS — Z85828 Personal history of other malignant neoplasm of skin: Secondary | ICD-10-CM | POA: Diagnosis not present

## 2021-12-03 DIAGNOSIS — L570 Actinic keratosis: Secondary | ICD-10-CM | POA: Diagnosis not present

## 2021-12-03 DIAGNOSIS — D485 Neoplasm of uncertain behavior of skin: Secondary | ICD-10-CM | POA: Diagnosis not present

## 2021-12-03 DIAGNOSIS — L820 Inflamed seborrheic keratosis: Secondary | ICD-10-CM | POA: Diagnosis not present

## 2021-12-03 DIAGNOSIS — D045 Carcinoma in situ of skin of trunk: Secondary | ICD-10-CM | POA: Diagnosis not present

## 2021-12-03 DIAGNOSIS — D225 Melanocytic nevi of trunk: Secondary | ICD-10-CM | POA: Diagnosis not present

## 2021-12-09 ENCOUNTER — Other Ambulatory Visit: Payer: Self-pay | Admitting: Family Medicine

## 2021-12-09 DIAGNOSIS — I10 Essential (primary) hypertension: Secondary | ICD-10-CM

## 2021-12-15 ENCOUNTER — Other Ambulatory Visit: Payer: Self-pay | Admitting: Family Medicine

## 2021-12-15 DIAGNOSIS — N644 Mastodynia: Secondary | ICD-10-CM

## 2021-12-18 DIAGNOSIS — H35033 Hypertensive retinopathy, bilateral: Secondary | ICD-10-CM | POA: Diagnosis not present

## 2021-12-18 DIAGNOSIS — H35373 Puckering of macula, bilateral: Secondary | ICD-10-CM | POA: Diagnosis not present

## 2021-12-18 DIAGNOSIS — H353131 Nonexudative age-related macular degeneration, bilateral, early dry stage: Secondary | ICD-10-CM | POA: Diagnosis not present

## 2021-12-18 DIAGNOSIS — Z961 Presence of intraocular lens: Secondary | ICD-10-CM | POA: Diagnosis not present

## 2022-01-05 ENCOUNTER — Telehealth: Payer: Self-pay | Admitting: Cardiology

## 2022-01-05 MED ORDER — ASPIRIN 81 MG PO TBEC: DELAYED_RELEASE_TABLET | ORAL | 3 refills | Status: DC

## 2022-01-05 NOTE — Telephone Encounter (Signed)
*  STAT* If patient is at the pharmacy, call can be transferred to refill team.   1. Which medications need to be refilled? (please list name of each medication and dose if known)   ASPIRIN ADULT LOW STRENGTH 81 MG EC tablet    2. Which pharmacy/location (including street and city if local pharmacy) is medication to be sent to?  [facility] - [place], [place] - [address]    3. Do they need a 30 day or 90 day supply? Matthew [name]

## 2022-01-12 ENCOUNTER — Other Ambulatory Visit: Payer: Self-pay | Admitting: Gastroenterology

## 2022-01-12 ENCOUNTER — Other Ambulatory Visit: Payer: Self-pay | Admitting: Cardiology

## 2022-01-12 NOTE — Telephone Encounter (Signed)
This is a patient of Dr Ardis Hughs.  Please advise refills as you are DOD am.   Thank you

## 2022-01-12 NOTE — Telephone Encounter (Signed)
Rx refill sent to pharmacy. 

## 2022-01-27 ENCOUNTER — Ambulatory Visit: Payer: Medicare HMO | Admitting: Podiatry

## 2022-01-27 DIAGNOSIS — M79674 Pain in right toe(s): Secondary | ICD-10-CM

## 2022-01-27 DIAGNOSIS — B351 Tinea unguium: Secondary | ICD-10-CM

## 2022-01-27 DIAGNOSIS — M79675 Pain in left toe(s): Secondary | ICD-10-CM

## 2022-01-27 DIAGNOSIS — L603 Nail dystrophy: Secondary | ICD-10-CM

## 2022-01-27 NOTE — Progress Notes (Signed)
This patient presents to the office with chief complaint of long thick painful nails.  Patient says the nails are painful walking and wearing shoes.  This patient is unable to self treat.  This patient is unable to trim his  nails since she is unable to reach his nails.  She presents to the office for preventative foot care services.  General Appearance  Alert, conversant and in no acute stress.  Vascular  Dorsalis pedis and posterior tibial  pulses are palpable  bilaterally.  Capillary return is within normal limits  bilaterally. Temperature is within normal limits  bilaterally.  Neurologic  Senn-Weinstein monofilament wire test within normal limits  bilaterally. Muscle power within normal limits bilaterally.  Nails Thick disfigured discolored nails with subungual debris  from hallux to fifth toes bilaterally. No evidence of bacterial infection or drainage bilaterally.  Orthopedic  No limitations of motion  feet .  No crepitus or effusions noted.  No bony pathology or digital deformities noted.  Skin  normotropic skin with no porokeratosis noted bilaterally.  No signs of infections or ulcers noted.     Onychomycosis  Nails  B/L.  Pain in right toes  Pain in left toes  Debridement of nails both feet followed trimming the nails with dremel tool.    RTC 3 months.   Gardiner Barefoot DPM

## 2022-02-04 ENCOUNTER — Ambulatory Visit
Admission: RE | Admit: 2022-02-04 | Discharge: 2022-02-04 | Disposition: A | Discharge status: Home / Self Care | Payer: Medicare HMO | Source: Ambulatory Visit | Attending: Family Medicine | Admitting: Family Medicine

## 2022-02-04 DIAGNOSIS — N644 Mastodynia: Secondary | ICD-10-CM

## 2022-02-04 DIAGNOSIS — N62 Hypertrophy of breast: Secondary | ICD-10-CM | POA: Diagnosis not present

## 2022-02-04 DIAGNOSIS — R928 Other abnormal and inconclusive findings on diagnostic imaging of breast: Secondary | ICD-10-CM | POA: Diagnosis not present

## 2022-02-12 ENCOUNTER — Other Ambulatory Visit (HOSPITAL_COMMUNITY): Payer: Self-pay

## 2022-03-05 ENCOUNTER — Encounter: Payer: Self-pay | Admitting: Pharmacist

## 2022-03-05 ENCOUNTER — Other Ambulatory Visit: Payer: Self-pay | Admitting: Pharmacist

## 2022-03-05 ENCOUNTER — Ambulatory Visit: Payer: Medicare HMO | Admitting: Family Medicine

## 2022-03-05 DIAGNOSIS — E785 Hyperlipidemia, unspecified: Secondary | ICD-10-CM

## 2022-03-05 DIAGNOSIS — I2510 Atherosclerotic heart disease of native coronary artery without angina pectoris: Secondary | ICD-10-CM

## 2022-03-05 MED ORDER — REPATHA SURECLICK 140 MG/ML ~~LOC~~ SOAJ: SUBCUTANEOUS | 11 refills | Status: DC

## 2022-03-05 NOTE — Telephone Encounter (Signed)
Received PA request for Repatha from pt's pharmacy. This has been submitted, key [id number]. This encounter was created in error - please disregard.

## 2022-03-16 ENCOUNTER — Telehealth: Payer: Self-pay | Admitting: Family Medicine

## 2022-03-16 ENCOUNTER — Other Ambulatory Visit: Payer: Self-pay

## 2022-03-16 ENCOUNTER — Telehealth: Payer: Self-pay | Admitting: Cardiology

## 2022-03-16 DIAGNOSIS — E785 Hyperlipidemia, unspecified: Secondary | ICD-10-CM

## 2022-03-16 DIAGNOSIS — I2510 Atherosclerotic heart disease of native coronary artery without angina pectoris: Secondary | ICD-10-CM

## 2022-03-16 MED ORDER — FENOFIBRATE MICRONIZED 134 MG PO CAPS: ORAL_CAPSULE | ORAL | 2 refills | Status: DC

## 2022-03-16 NOTE — Telephone Encounter (Signed)
*  STAT* If patient is at the pharmacy, call can be transferred to refill team.   1. Which medications need to be refilled? (please list name of each medication and dose if known)   Evolocumab (REPATHA SURECLICK) 010 MG/ML SOAJ   2. Which pharmacy/location (including street and city if local pharmacy) is medication to be sent to?  [address] [place] [address], fax# [phone]  3. Do they need a 30 day or 90 day supply? Quantity of 1 injector  Caller noted patient will need a prescription sent to them to replace his damaged medication. Case Ref# [id number]

## 2022-03-16 NOTE — Telephone Encounter (Signed)
Prescription Request  03/16/2022  Is this a "Controlled Substance" medicine? No  LOV: 10/30/2021  What is the name of the medication or equipment?  fenofibrate micronized (LOFIBRA) 134 MG capsule   Have you contacted your pharmacy to request a refill? No   Which pharmacy would you like this sent to?  [facility] - [place], [place] - [address] [address] Phone: [phone] Fax: [phone]   Patient notified that their request is being sent to the clinical staff for review and that they should receive a response within 2 business days.   Please advise at Mobile [phone] (mobile)

## 2022-03-17 ENCOUNTER — Other Ambulatory Visit (HOSPITAL_COMMUNITY): Payer: Self-pay

## 2022-03-17 MED ORDER — REPATHA SURECLICK 140 MG/ML ~~LOC~~ SOAJ: SUBCUTANEOUS | 5 refills | Status: DC

## 2022-03-20 ENCOUNTER — Encounter: Payer: Self-pay | Admitting: Cardiology

## 2022-03-20 NOTE — Telephone Encounter (Signed)
Error

## 2022-03-24 ENCOUNTER — Telehealth: Payer: Self-pay

## 2022-03-24 ENCOUNTER — Other Ambulatory Visit (HOSPITAL_COMMUNITY): Payer: Self-pay

## 2022-03-24 NOTE — Telephone Encounter (Signed)
Pharmacy Patient Advocate Encounter  Prior Authorization for Repatha 140MG/ML has been approved.    PA# [id number] Effective dates: 1.1.22 through 12.31.24  Spoke with Pharmacy to process.

## 2022-04-13 ENCOUNTER — Telehealth: Payer: Self-pay | Admitting: Family Medicine

## 2022-04-13 ENCOUNTER — Other Ambulatory Visit: Payer: Self-pay

## 2022-04-13 MED ORDER — FENOFIBRATE MICRONIZED 134 MG PO CAPS: ORAL_CAPSULE | ORAL | 2 refills | Status: DC

## 2022-04-13 MED ORDER — AZELASTINE HCL 0.1 % NA SOLN: 2.0000 | Freq: Two times a day (BID) | NASAL | 5 refills | Status: DC

## 2022-04-13 NOTE — Telephone Encounter (Signed)
Pt needs his prescription for fenofibrate micronized (LOFIBRA) 134 MG capsule to be sent to [facility]. He knows to call [facility] to have this medication filled for this month but would like a prescription to be sent to sent to [facility] for future refills.   He also needs a refill for azelastine (ASTELIN) 0.1 % nasal spray sent to [facility].

## 2022-04-13 NOTE — Telephone Encounter (Signed)
Refills was sent

## 2022-04-13 NOTE — Telephone Encounter (Signed)
Fenofibrate refill needs to go go [facility]. Please send this prescription to correct pharmacy.

## 2022-04-13 NOTE — Telephone Encounter (Signed)
Re sent medication  

## 2022-04-16 ENCOUNTER — Telehealth: Payer: Self-pay | Admitting: Cardiology

## 2022-04-16 NOTE — Telephone Encounter (Signed)
Routed to pharmacy team for assistance as Dr. Harriet Masson is not in the office

## 2022-04-16 NOTE — Telephone Encounter (Signed)
Pt c/o medication issue:  1. Name of Medication: Evolocumab (REPATHA SURECLICK) [id number] MG/ML SOAJ   2. How are you currently taking this medication (dosage and times per day)? As prescribed  3. Are you having a reaction (difficulty breathing--STAT)? No  4. What is your medication issue? Pt states that his insurance, [employer], will be faxing an approval form to Dr. Harriet Masson, that pt states needs to be filled out quickly and resent to them. He states this needs to be done within a day. Pt states that this approval form is for a refill. He states his next dose is supposed to be on Wednesday, he believes.

## 2022-04-17 MED ORDER — REPATHA SURECLICK 140 MG/ML ~~LOC~~ SOAJ: 140.0000 mg | SUBCUTANEOUS | 0 refills | Status: DC

## 2022-04-17 NOTE — Telephone Encounter (Signed)
Spoke with [employer] - straightened out prior authorization problem.  They confirmed next refill due 3/22  Spoke with patient.  He notes that a pen did not work and thus he is out of med sooner than planned.  Called [employer] for replacement, but says he was sent to [facility], left lot # of the defective pen and never heard back.    Will give sample pen for replacement, patient voiced understanding.

## 2022-04-21 ENCOUNTER — Other Ambulatory Visit: Payer: Self-pay

## 2022-04-21 ENCOUNTER — Ambulatory Visit: Payer: Medicare HMO | Admitting: Family Medicine

## 2022-04-21 ENCOUNTER — Ambulatory Visit (INDEPENDENT_AMBULATORY_CARE_PROVIDER_SITE_OTHER): Payer: Medicare HMO | Admitting: Family Medicine

## 2022-04-21 VITALS — BP 110/64 | HR 74 | Temp 97.5°F | Resp 16 | Ht 68.0 in | Wt 196.0 lb

## 2022-04-21 DIAGNOSIS — R739 Hyperglycemia, unspecified: Secondary | ICD-10-CM | POA: Diagnosis not present

## 2022-04-21 DIAGNOSIS — T466X5A Adverse effect of antihyperlipidemic and antiarteriosclerotic drugs, initial encounter: Secondary | ICD-10-CM | POA: Diagnosis not present

## 2022-04-21 DIAGNOSIS — T7840XD Allergy, unspecified, subsequent encounter: Secondary | ICD-10-CM

## 2022-04-21 DIAGNOSIS — G4733 Obstructive sleep apnea (adult) (pediatric): Secondary | ICD-10-CM

## 2022-04-21 DIAGNOSIS — G720 Drug-induced myopathy: Secondary | ICD-10-CM | POA: Diagnosis not present

## 2022-04-21 DIAGNOSIS — E785 Hyperlipidemia, unspecified: Secondary | ICD-10-CM

## 2022-04-21 DIAGNOSIS — M25561 Pain in right knee: Secondary | ICD-10-CM

## 2022-04-21 MED ORDER — FENOFIBRATE MICRONIZED 134 MG PO CAPS: ORAL_CAPSULE | ORAL | 2 refills | Status: DC

## 2022-04-21 NOTE — Assessment & Plan Note (Signed)
Tolerating repatha.  
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
## 2022-04-21 NOTE — Assessment & Plan Note (Signed)
hgba1c acceptable, minimize simple carbs. Increase exercise as tolerated.  

## 2022-04-21 NOTE — Patient Instructions (Signed)
Sleep Apnea Sleep apnea is a condition in which breathing pauses or becomes shallow during sleep. People with sleep apnea usually snore loudly. They may have times when they gasp and stop breathing for 10 seconds or more during sleep. This may happen many times during the night. Sleep apnea disrupts your sleep and keeps your body from getting the rest that it needs. This condition can increase your risk of certain health problems, including: Heart attack. Stroke. Obesity. Type 2 diabetes. Heart failure. Irregular heartbeat. High blood pressure. The goal of treatment is to help you breathe normally again. What are the causes?  The most common cause of sleep apnea is a collapsed or blocked airway. There are three kinds of sleep apnea: Obstructive sleep apnea. This kind is caused by a blocked or collapsed airway. Central sleep apnea. This kind happens when the part of the brain that controls breathing does not send the correct signals to the muscles that control breathing. Mixed sleep apnea. This is a combination of obstructive and central sleep apnea. What increases the risk? You are more likely to develop this condition if you: Are overweight. Smoke. Have a smaller than normal airway. Are older. Are male. Drink alcohol. Take sedatives or tranquilizers. Have a family history of sleep apnea. Have a tongue or tonsils that are larger than normal. What are the signs or symptoms? Symptoms of this condition include: Trouble staying asleep. Loud snoring. Morning headaches. Waking up gasping. Dry mouth or sore throat in the morning. Daytime sleepiness and tiredness. If you have daytime fatigue because of sleep apnea, you may be more likely to have: Trouble concentrating. Forgetfulness. Irritability or mood swings. Personality changes. Feelings of depression. Sexual dysfunction. This may include loss of interest if you are male, or erectile dysfunction if you are male. How is this  diagnosed? This condition may be diagnosed with: A medical history. A physical exam. A series of tests that are done while you are sleeping (sleep study). These tests are usually done in a sleep lab, but they may also be done at home. How is this treated? Treatment for this condition aims to restore normal breathing and to ease symptoms during sleep. It may involve managing health issues that can affect breathing, such as high blood pressure or obesity. Treatment may include: Sleeping on your side. Using a decongestant if you have nasal congestion. Avoiding the use of depressants, including alcohol, sedatives, and narcotics. Losing weight if you are overweight. Making changes to your diet. Quitting smoking. Using a device to open your airway while you sleep, such as: An oral appliance. This is a custom-made mouthpiece that shifts your lower jaw forward. A continuous positive airway pressure (CPAP) device. This device blows air through a mask when you breathe out (exhale). A nasal expiratory positive airway pressure (EPAP) device. This device has valves that you put into each nostril. A bi-level positive airway pressure (BIPAP) device. This device blows air through a mask when you breathe in (inhale) and breathe out (exhale). Having surgery if other treatments do not work. During surgery, excess tissue is removed to create a wider airway. Follow these instructions at home: Lifestyle Make any lifestyle changes that your health care provider recommends. Eat a healthy, well-balanced diet. Take steps to lose weight if you are overweight. Avoid using depressants, including alcohol, sedatives, and narcotics. Do not use any products that contain nicotine or tobacco. These products include cigarettes, chewing tobacco, and vaping devices, such as e-cigarettes. If you need help quitting, ask   your health care provider. General instructions Take over-the-counter and prescription medicines only as told  by your health care provider. If you were given a device to open your airway while you sleep, use it only as told by your health care provider. If you are having surgery, make sure to tell your health care provider you have sleep apnea. You may need to bring your device with you. Keep all follow-up visits. This is important. Contact a health care provider if: The device that you received to open your airway during sleep is uncomfortable or does not seem to be working. Your symptoms do not improve. Your symptoms get worse. Get help right away if: You develop: Chest pain. Shortness of breath. Discomfort in your back, arms, or stomach. You have: Trouble speaking. Weakness on one side of your body. Drooping in your face. These symptoms may represent a serious problem that is an emergency. Do not wait to see if the symptoms will go away. Get medical help right away. Call your local emergency services (911 in the [place] Do not drive yourself to the hospital. Summary Sleep apnea is a condition in which breathing pauses or becomes shallow during sleep. The most common cause is a collapsed or blocked airway. The goal of treatment is to restore normal breathing and to ease symptoms during sleep. This information is not intended to replace advice given to you by your health care provider. Make sure you discuss any questions you have with your health care provider. Document Revised: 08/28/2020 Document Reviewed: 12/29/2019 Elsevier Patient Education  2023 Elsevier Inc.  

## 2022-04-21 NOTE — Assessment & Plan Note (Signed)
Does not tolerate statins is on repatha

## 2022-04-21 NOTE — Assessment & Plan Note (Signed)
Did not tolerate CPAP, he is not treating but is unwilling to return for further evaluation he will let us know if he is willing to take a prescription

## 2022-04-22 ENCOUNTER — Encounter: Payer: Self-pay | Admitting: Family Medicine

## 2022-04-22 DIAGNOSIS — M25561 Pain in right knee: Secondary | ICD-10-CM | POA: Insufficient documentation

## 2022-04-22 LAB — COMPREHENSIVE METABOLIC PANEL
ALT: 16 U/L (ref 0–53)
AST: 20 U/L (ref 0–37)
Albumin: 3.8 g/dL (ref 3.5–5.2)
Alkaline Phosphatase: 43 U/L (ref 39–117)
BUN: 19 mg/dL (ref 6–23)
CO2: 28 mEq/L (ref 19–32)
Calcium: 9.6 mg/dL (ref 8.4–10.5)
Chloride: 105 mEq/L (ref 96–112)
Creatinine, Ser: 1.18 mg/dL (ref 0.40–1.50)
GFR: 56.58 mL/min — ABNORMAL LOW (ref >60.00)
Glucose, Bld: 92 mg/dL (ref 70–99)
Potassium: 4.2 mEq/L (ref 3.5–5.1)
Sodium: 140 mEq/L (ref 135–145)
Total Bilirubin: 0.4 mg/dL (ref 0.2–1.2)
Total Protein: 6.3 g/dL (ref 6.0–8.3)

## 2022-04-22 LAB — CBC WITH DIFFERENTIAL/PLATELET
Basophils Absolute: 0.1 10*3/uL (ref 0.0–0.1)
Basophils Relative: 1 % (ref 0.0–3.0)
Eosinophils Absolute: 0.3 10*3/uL (ref 0.0–0.7)
Eosinophils Relative: 2.8 % (ref 0.0–5.0)
HCT: 39.5 % (ref 39.0–52.0)
Hemoglobin: 13.1 g/dL (ref 13.0–17.0)
Lymphocytes Relative: 19.8 % (ref 12.0–46.0)
Lymphs Abs: 1.9 10*3/uL (ref 0.7–4.0)
MCHC: 33.1 g/dL (ref 30.0–36.0)
MCV: 88.6 fl (ref 78.0–100.0)
Monocytes Absolute: 0.8 10*3/uL (ref 0.1–1.0)
Monocytes Relative: 8.1 % (ref 3.0–12.0)
Neutro Abs: 6.6 10*3/uL (ref 1.4–7.7)
Neutrophils Relative %: 68.3 % (ref 43.0–77.0)
Platelets: 210 10*3/uL (ref 150.0–400.0)
RBC: 4.45 Mil/uL (ref 4.22–5.81)
RDW: 14 % (ref 11.5–15.5)
WBC: 9.7 10*3/uL (ref 4.0–10.5)

## 2022-04-22 LAB — LIPID PANEL
Cholesterol: 134 mg/dL (ref 0–200)
HDL: 44.9 mg/dL (ref >39.00)
LDL Cholesterol: 65 mg/dL (ref 0–99)
NonHDL: 89.04
Total CHOL/HDL Ratio: 3
Triglycerides: 120 mg/dL (ref 0.0–149.0)
VLDL: 24 mg/dL (ref 0.0–40.0)

## 2022-04-22 LAB — TSH: TSH: 1.72 u[IU]/mL (ref 0.35–5.50)

## 2022-04-22 LAB — HEMOGLOBIN A1C: Hgb A1c MFr Bld: 6.4 % (ref 4.6–6.5)

## 2022-04-22 NOTE — Assessment & Plan Note (Signed)
Encouraged moist heat and gentle stretching as tolerated. May try Tylenol and prescription meds as directed and report if symptoms worsen or seek immediate care 

## 2022-04-22 NOTE — Assessment & Plan Note (Signed)
Using Azelastine bid and while his allergies are increased some presently but not enough for him to feel he needs his meds increased.

## 2022-04-23 ENCOUNTER — Telehealth: Payer: Self-pay | Admitting: Family Medicine

## 2022-04-23 MED ORDER — FENOFIBRATE MICRONIZED 134 MG PO CAPS: ORAL_CAPSULE | ORAL | 2 refills | Status: DC

## 2022-04-23 NOTE — Telephone Encounter (Signed)
Pt called stating that his fenofibrate Rx was not received by [facility] and was wondering if it could b resent to them.

## 2022-04-23 NOTE — Addendum Note (Signed)
Addended byDamita Dunnings D on: 04/23/2022 03:10 PM   Modules accepted: Orders

## 2022-04-23 NOTE — Telephone Encounter (Signed)
Rx resent.

## 2022-04-28 ENCOUNTER — Ambulatory Visit: Payer: Medicare HMO | Admitting: Podiatry

## 2022-04-28 ENCOUNTER — Encounter: Payer: Self-pay | Admitting: Podiatry

## 2022-04-28 DIAGNOSIS — L603 Nail dystrophy: Secondary | ICD-10-CM

## 2022-04-28 DIAGNOSIS — M79674 Pain in right toe(s): Secondary | ICD-10-CM

## 2022-04-28 DIAGNOSIS — B351 Tinea unguium: Secondary | ICD-10-CM | POA: Diagnosis not present

## 2022-04-28 DIAGNOSIS — M79675 Pain in left toe(s): Secondary | ICD-10-CM | POA: Diagnosis not present

## 2022-04-28 NOTE — Progress Notes (Signed)
This patient presents to the office with chief complaint of long thick painful nails.  Patient says the nails are painful walking and wearing shoes.  This patient is unable to self treat.  This patient is unable to trim his nails since she is unable to reach his nails.  She presents to the office for preventative foot care services.  General Appearance  Alert, conversant and in no acute stress.  Vascular  Dorsalis pedis and posterior tibial  pulses are palpable  bilaterally.  Capillary return is within normal limits  bilaterally. Temperature is within normal limits  bilaterally.  Neurologic  Senn-Weinstein monofilament wire test within normal limits  bilaterally. Muscle power within normal limits bilaterally.  Nails Thick disfigured discolored nails with subungual debris  from hallux to fifth toes bilaterally. No evidence of bacterial infection or drainage bilaterally.  Orthopedic  No limitations of motion  feet .  No crepitus or effusions noted.  No bony pathology or digital deformities noted.  Skin  normotropic skin with no porokeratosis noted bilaterally.  No signs of infections or ulcers noted.     Onychomycosis  Nails  B/L.  Pain in right toes  Pain in left toes  Debridement of nails both feet followed trimming the nails with dremel tool.    RTC 3 months.   Imaad Reuss DPM  

## 2022-06-08 DIAGNOSIS — L570 Actinic keratosis: Secondary | ICD-10-CM | POA: Diagnosis not present

## 2022-06-08 DIAGNOSIS — Z85828 Personal history of other malignant neoplasm of skin: Secondary | ICD-10-CM | POA: Diagnosis not present

## 2022-06-08 DIAGNOSIS — D225 Melanocytic nevi of trunk: Secondary | ICD-10-CM | POA: Diagnosis not present

## 2022-06-08 DIAGNOSIS — D485 Neoplasm of uncertain behavior of skin: Secondary | ICD-10-CM | POA: Diagnosis not present

## 2022-06-08 DIAGNOSIS — L821 Other seborrheic keratosis: Secondary | ICD-10-CM | POA: Diagnosis not present

## 2022-06-08 DIAGNOSIS — C4441 Basal cell carcinoma of skin of scalp and neck: Secondary | ICD-10-CM | POA: Diagnosis not present

## 2022-06-11 ENCOUNTER — Telehealth: Payer: Self-pay | Admitting: Cardiology

## 2022-06-11 NOTE — Telephone Encounter (Signed)
Pt c/o medication issue:  1. Name of Medication: Repatha  2. How are you currently taking this medication (dosage and times per day)?   3. Are you having a reaction (difficulty breathing--STAT)?   4. What is your medication issue? Patient said the first shot worked, but the second shot did not work, it did not inject

## 2022-06-11 NOTE — Telephone Encounter (Signed)
Called patient, and advised of the message from [facility].   Patient verbalized understanding

## 2022-06-11 NOTE — Telephone Encounter (Signed)
Patient will need to call number on box and tell them the pen malfunctioned and they will send a new one

## 2022-06-12 ENCOUNTER — Telehealth: Payer: Self-pay | Admitting: Cardiology

## 2022-06-12 NOTE — Telephone Encounter (Signed)
Approval given verbally for replacement pen( Repatha) to be given to pt ./cy

## 2022-06-12 NOTE — Telephone Encounter (Signed)
Pt c/o medication issue:  1. Name of Medication:   Evolocumab (REPATHA SURECLICK) 140 MG/ML SOAJ    2. How are you currently taking this medication (dosage and times per day)? nject 140 mg into the skin every 14 (fourteen) days.   3. Are you having a reaction (difficulty breathing--STAT)? No  4. What is your medication issue? Need verbal approval to replace medication. Call transferred

## 2022-06-24 DIAGNOSIS — H0102B Squamous blepharitis left eye, upper and lower eyelids: Secondary | ICD-10-CM | POA: Diagnosis not present

## 2022-06-24 DIAGNOSIS — H04123 Dry eye syndrome of bilateral lacrimal glands: Secondary | ICD-10-CM | POA: Diagnosis not present

## 2022-06-24 DIAGNOSIS — H0102A Squamous blepharitis right eye, upper and lower eyelids: Secondary | ICD-10-CM | POA: Diagnosis not present

## 2022-07-17 MED ORDER — REPATHA SURECLICK 140 MG/ML ~~LOC~~ SOAJ: 140.0000 mg | SUBCUTANEOUS | 0 refills | Status: DC

## 2022-07-17 NOTE — Addendum Note (Signed)
Addended by: Tacie Mccuistion E on: 07/17/2022 02:31 PM   Modules accepted: Orders

## 2022-07-17 NOTE — Telephone Encounter (Signed)
Syringe formulation of Repatha will not be available after this year, only the 140mg  sureclick autoinjector pen will be. [facility] replaces damaged Repatha pens for free.  Spoke with pt, had an issue with his most recent injection. Has had an issue with 5-6 doses since being on Repatha though. Reviewed injection technique in detail. Sent in rx for replacement pen. Pt wanted to note the lot # [id number], exp 11/04/2024 of his malfunctioning pen. He was appreciative for the call.

## 2022-07-17 NOTE — Telephone Encounter (Signed)
Pt c/o medication issue:  1. Name of Medication:   Evolocumab (REPATHA SURECLICK) 140 MG/ML SOAJ   2. How are you currently taking this medication (dosage and times per day)?   Yes  3. Are you having a reaction (difficulty breathing--STAT)?   4. What is your medication issue?   Patient stated [employer] makes this medication is available in a manual injection. Patient stated the insurance will no longer cover the cost if the injectable needle does not work.  Patient wants to know if he can go to manual injection twice a month and take it twice a month instead of every 14 days.

## 2022-07-21 ENCOUNTER — Telehealth: Payer: Self-pay | Admitting: Family Medicine

## 2022-07-21 NOTE — Telephone Encounter (Signed)
Pt called stating that he is concerned about him and his wife [name] waiting until September to see Dr. Abner Greenspan and was looking to see if Dr. Abner Greenspan could get the two of them worked in before then. Please Advise.

## 2022-07-22 NOTE — Telephone Encounter (Signed)
Called pt was advised Dr.Blyth don't have any appt, if needing to be seen sooner, another provider  Can see them.Pt stated ok to wait to appt with Dr. Abner Greenspan.

## 2022-07-23 ENCOUNTER — Ambulatory Visit: Payer: Medicare HMO | Admitting: Family Medicine

## 2022-07-23 ENCOUNTER — Encounter: Payer: Medicare HMO | Admitting: *Deleted

## 2022-07-23 NOTE — Progress Notes (Signed)
Called pt; no answer.This encounter was created in error - please disregard. 

## 2022-07-27 ENCOUNTER — Other Ambulatory Visit: Payer: Self-pay | Admitting: Family Medicine

## 2022-07-29 ENCOUNTER — Ambulatory Visit: Payer: Medicare HMO | Admitting: Podiatry

## 2022-07-29 ENCOUNTER — Encounter: Payer: Self-pay | Admitting: Podiatry

## 2022-07-29 DIAGNOSIS — M79674 Pain in right toe(s): Secondary | ICD-10-CM | POA: Diagnosis not present

## 2022-07-29 DIAGNOSIS — B351 Tinea unguium: Secondary | ICD-10-CM | POA: Diagnosis not present

## 2022-07-29 DIAGNOSIS — M79675 Pain in left toe(s): Secondary | ICD-10-CM | POA: Diagnosis not present

## 2022-07-29 NOTE — Progress Notes (Signed)
This patient presents to the office with chief complaint of long thick painful nails.  Patient says the nails are painful walking and wearing shoes.  This patient is unable to self treat.  This patient is unable to trim his nails since she is unable to reach his nails.  She presents to the office for preventative foot care services.  General Appearance  Alert, conversant and in no acute stress.  Vascular  Dorsalis pedis and posterior tibial  pulses are palpable  bilaterally.  Capillary return is within normal limits  bilaterally. Temperature is within normal limits  bilaterally.  Neurologic  Senn-Weinstein monofilament wire test within normal limits  bilaterally. Muscle power within normal limits bilaterally.  Nails Thick disfigured discolored nails with subungual debris  from hallux to fifth toes bilaterally. No evidence of bacterial infection or drainage bilaterally.  Orthopedic  No limitations of motion  feet .  No crepitus or effusions noted.  No bony pathology or digital deformities noted.  Skin  normotropic skin with no porokeratosis noted bilaterally.  No signs of infections or ulcers noted.     Onychomycosis  Nails  B/L.  Pain in right toes  Pain in left toes  Debridement of nails both feet followed trimming the nails with dremel tool.    RTC 3 months.   Glady Ouderkirk DPM  

## 2022-09-08 ENCOUNTER — Encounter: Payer: Self-pay | Admitting: Podiatry

## 2022-09-08 ENCOUNTER — Ambulatory Visit: Payer: Medicare HMO | Admitting: Podiatry

## 2022-09-08 DIAGNOSIS — M79674 Pain in right toe(s): Secondary | ICD-10-CM

## 2022-09-08 DIAGNOSIS — M79675 Pain in left toe(s): Secondary | ICD-10-CM

## 2022-09-08 DIAGNOSIS — B351 Tinea unguium: Secondary | ICD-10-CM | POA: Diagnosis not present

## 2022-09-08 NOTE — Progress Notes (Signed)
He presents today after having seen Dr. Stacie Acres for quite some time for routine nail debridement.  He is complaining of an area of an ingrown nail that has grown back from when he had a procedure done quite sometime ago.  He is questioning whether or not he should have it removed permanently.  Objective: Vital signs are stable he is alert and oriented x 3 pulses are palpable.  Lateral border hallux right demonstrates a small spicule of nail that is present it can just be clipped off at each time Dr. Corrie Dandy cesium.  Assessment: Small spicule of nail remaining.  Plan: Debrided the spicule for him today he will follow-up with Dr. Collene Schlichter on a regular basis and have the nail trimmed with the rest of the nails.

## 2022-09-16 NOTE — Assessment & Plan Note (Signed)
Asymptomatic, follows with cardiology 

## 2022-09-16 NOTE — Assessment & Plan Note (Signed)
Does not tolerate statins.

## 2022-09-16 NOTE — Assessment & Plan Note (Signed)
Well controlled, no changes to meds. Encouraged heart healthy diet such as the DASH diet and exercise as tolerated.  °

## 2022-09-16 NOTE — Assessment & Plan Note (Signed)
hgba1c acceptable, minimize simple carbs. Increase exercise as tolerated.  

## 2022-09-16 NOTE — Assessment & Plan Note (Signed)
Tolerating repatha 

## 2022-09-17 ENCOUNTER — Encounter: Payer: Self-pay | Admitting: Family Medicine

## 2022-09-17 ENCOUNTER — Ambulatory Visit: Payer: Medicare HMO | Admitting: Family Medicine

## 2022-09-17 VITALS — BP 122/66 | HR 56 | Ht 68.0 in | Wt 201.0 lb

## 2022-09-17 DIAGNOSIS — I2510 Atherosclerotic heart disease of native coronary artery without angina pectoris: Secondary | ICD-10-CM

## 2022-09-17 DIAGNOSIS — R351 Nocturia: Secondary | ICD-10-CM | POA: Diagnosis not present

## 2022-09-17 DIAGNOSIS — T466X5A Adverse effect of antihyperlipidemic and antiarteriosclerotic drugs, initial encounter: Secondary | ICD-10-CM

## 2022-09-17 DIAGNOSIS — R739 Hyperglycemia, unspecified: Secondary | ICD-10-CM

## 2022-09-17 DIAGNOSIS — M791 Myalgia, unspecified site: Secondary | ICD-10-CM

## 2022-09-17 DIAGNOSIS — E785 Hyperlipidemia, unspecified: Secondary | ICD-10-CM | POA: Diagnosis not present

## 2022-09-17 DIAGNOSIS — I10 Essential (primary) hypertension: Secondary | ICD-10-CM | POA: Diagnosis not present

## 2022-09-17 LAB — URINALYSIS
Bilirubin Urine: NEGATIVE
Hgb urine dipstick: NEGATIVE
Ketones, ur: NEGATIVE
Leukocytes,Ua: NEGATIVE
Nitrite: NEGATIVE
Specific Gravity, Urine: 1.02 (ref 1.000–1.030)
Total Protein, Urine: NEGATIVE
Urine Glucose: NEGATIVE
Urobilinogen, UA: 0.2 (ref 0.0–1.0)
pH: 7 (ref 5.0–8.0)

## 2022-09-17 NOTE — Patient Instructions (Signed)
Try to increase fluid intake to a minimum of 40 ounces daily and consider increasing up to 60   Plain Mucinex/Guaifenesin twice daily   Dehydration, Adult Dehydration is a condition in which there is not enough water or other fluids in the body. This happens when a person loses more fluids than they take in. Important organs, such as the kidneys, brain, and heart, cannot function without a proper amount of fluids. Any loss of fluids from the body can lead to dehydration. Dehydration can be mild, moderate, or severe. It should be treated right away to prevent it from becoming severe. What are the causes? Dehydration may be caused by: Health conditions, such as diarrhea, vomiting, fever, infection, or sweating or urinating a lot. Not drinking enough fluids. Certain medicines, such as medicines that remove excess fluid from the body (diuretics). Lack of safe drinking water. Not being able to get enough water and food. What increases the risk? The following factors may make you more likely to develop this condition: Having a long-term (chronic) illness that has not been treated properly, such as diabetes, heart disease, or kidney disease. Being 54 years of age or older. Having a disability. Living in a place that is high in altitude, where thinner, drier air causes more fluid loss. Doing exercises that put stress on your body for a long time (endurance sports). Being active in a hot climate. What are the signs or symptoms? Symptoms of dehydration depend on how severe it is. Mild or moderate dehydration Thirst. Dry lips or dry mouth. Dizziness or light-headedness. Muscle cramps. Dark urine. Urine may be the color of tea. Less urine or tears produced than usual. Headache. Severe dehydration Changes in skin. Your skin may be cold and clammy, blotchy, or pale. Your skin also may not return to normal after being lightly pinched and released. Little or no tears, urine, or sweat. Rapid  breathing and low blood pressure. Your pulse may be weak or may be faster than 100 beats per minute when you are sitting still. Other changes, such as: Feeling very thirsty. Sunken eyes. Cold hands and feet. Confusion. Being very tired (lethargic) or having trouble waking from sleep. Short-term weight loss. Loss of consciousness. How is this diagnosed? This condition is diagnosed based on your symptoms and a physical exam. You may have blood and urine tests to help confirm the diagnosis. How is this treated? Treatment for this condition depends on how severe it is. Treatment should be started right away. Do not wait until dehydration becomes severe. Severe dehydration is an emergency and needs to be treated in a hospital. Mild or moderate dehydration can be treated at home. You may be asked to: Drink more fluids. Drink an oral rehydration solution (ORS). This drink restores fluids, salts, and minerals in the blood (electrolytes). Stop any activities that caused dehydration, such as exercise. Cool off with cool compresses, cool mist, or cool fluids, if heat or too much sweat caused your condition. Take medicine to treat fever, if fever caused your condition. Take medicine to treat nausea and diarrhea, if vomiting or diarrhea caused your condition. Severe dehydration can be treated: With IV fluids. By correcting abnormal levels of electrolytes in your body. By treating the underlying cause of dehydration. Follow these instructions at home: Oral rehydration solution If told by your health care provider, drink an ORS: Make an ORS by following instructions on the package. Start by drinking small amounts, about  cup (120 mL) every 5-10 minutes. Slowly increase how  much you drink until you have taken the amount recommended by your health care provider.  Eating and drinking  Drink enough clear fluid to keep your urine pale yellow. If you were told to drink an ORS, finish the ORS first and  then start slowly drinking other clear fluids. Drink fluids such as: Water. Do not drink only water. Doing that can lead to hyponatremia, which is having too little salt (sodium) in the body. Water from ice chips you suck on. Diluted fruit juice. This is fruit juice that you have added water to. Low-calorie sports drinks. Eat foods that contain a healthy balance of electrolytes, such as bananas, oranges, potatoes, tomatoes, and spinach. Do not drink alcohol. Avoid the following: Drinks that contain a lot of sugar. These include high-calorie sports drinks, fruit juice that is not diluted, and soda. Caffeine. Foods that are greasy or contain a lot of fat or sugar. General instructions Take over-the-counter and prescription medicines only as told by your health care provider. Do not take sodium tablets. Doing that can lead to having too much sodium in the body (hypernatremia). Return to your normal activities as told by your health care provider. Ask your health care provider what activities are safe for you. Keep all follow-up visits. Your health care provider may need to check your progress and suggest new ways to treat your condition. Contact a health care provider if: You have muscle cramps, pain, or discomfort, such as: Pain in your abdomen and the pain gets worse or stays in one area. Stiff neck. You have a rash. You are more irritable than usual. You are sleepier or have a harder time waking. You feel weak or dizzy. You feel very thirsty. Get help right away if: You have symptoms of severe dehydration. You vomit every time you eat or drink. Your vomiting gets worse, does not go away, or includes blood or green matter (bile). You are getting treatment but symptoms are getting worse. You have a fever. You have a severe headache. You have: Diarrhea that gets worse or does not go away. Blood in your stool. This may cause stool to look black and tarry. Not urinating, or urinating  only a small amount of very dark urine, within 6-8 hours. You have trouble breathing. These symptoms may be an emergency. Get help right away. Do not wait to see if the symptoms will go away. Do not drive yourself to the hospital. Call 911. This information is not intended to replace advice given to you by your health care provider. Make sure you discuss any questions you have with your health care provider. Document Revised: 08/18/2021 Document Reviewed: 08/18/2021 Elsevier Patient Education  2024 [employer].

## 2022-09-18 ENCOUNTER — Ambulatory Visit: Payer: Medicare HMO | Attending: Cardiology | Admitting: Cardiology

## 2022-09-18 VITALS — BP 120/72 | HR 71 | Ht 68.0 in | Wt 203.6 lb

## 2022-09-18 DIAGNOSIS — E669 Obesity, unspecified: Secondary | ICD-10-CM | POA: Diagnosis not present

## 2022-09-18 DIAGNOSIS — I2510 Atherosclerotic heart disease of native coronary artery without angina pectoris: Secondary | ICD-10-CM

## 2022-09-18 DIAGNOSIS — E785 Hyperlipidemia, unspecified: Secondary | ICD-10-CM

## 2022-09-18 DIAGNOSIS — I10 Essential (primary) hypertension: Secondary | ICD-10-CM | POA: Diagnosis not present

## 2022-09-18 LAB — COMPREHENSIVE METABOLIC PANEL
ALT: 15 U/L (ref 0–53)
ALT: 15 IU/L (ref 0–44)
AST: 20 U/L (ref 0–37)
AST: 20 IU/L (ref 0–40)
Albumin: 4.1 g/dL (ref 3.5–5.2)
Albumin: 4.2 g/dL (ref 3.7–4.7)
Alkaline Phosphatase: 35 U/L — ABNORMAL LOW (ref 39–117)
Alkaline Phosphatase: 46 IU/L (ref 44–121)
BUN/Creatinine Ratio: 15 (ref 10–24)
BUN: 18 mg/dL (ref 6–23)
BUN: 17 mg/dL (ref 8–27)
Bilirubin Total: 0.5 mg/dL (ref 0.0–1.2)
CO2: 30 meq/L (ref 19–32)
CO2: 23 mmol/L (ref 20–29)
Calcium: 10.3 mg/dL (ref 8.4–10.5)
Calcium: 9.6 mg/dL (ref 8.6–10.2)
Chloride: 101 meq/L (ref 96–112)
Chloride: 106 mmol/L (ref 96–106)
Creatinine, Ser: 1.11 mg/dL (ref 0.40–1.50)
Creatinine, Ser: 1.1 mg/dL (ref 0.76–1.27)
GFR: 60.71 mL/min (ref >60.00)
Globulin, Total: 2.4 g/dL (ref 1.5–4.5)
Glucose, Bld: 92 mg/dL (ref 70–99)
Glucose: 111 mg/dL — ABNORMAL HIGH (ref 70–99)
Potassium: 4.3 meq/L (ref 3.5–5.1)
Potassium: 4.1 mmol/L (ref 3.5–5.2)
Sodium: 136 meq/L (ref 135–145)
Sodium: 142 mmol/L (ref 134–144)
Total Bilirubin: 0.5 mg/dL (ref 0.2–1.2)
Total Protein: 6.5 g/dL (ref 6.0–8.3)
Total Protein: 6.6 g/dL (ref 6.0–8.5)
eGFR: 66 mL/min/{1.73_m2} (ref >59)

## 2022-09-18 LAB — MAGNESIUM
Magnesium: 2 mg/dL (ref 1.5–2.5)
Magnesium: 2 mg/dL (ref 1.6–2.3)

## 2022-09-18 LAB — URINE CULTURE
MICRO NUMBER:: 15335963
Result:: NO GROWTH
SPECIMEN QUALITY:: ADEQUATE

## 2022-09-18 LAB — LIPID PANEL
Chol/HDL Ratio: 2.7 ratio (ref 0.0–5.0)
Cholesterol, Total: 127 mg/dL (ref 100–199)
Cholesterol: 123 mg/dL (ref 0–200)
HDL: 42.4 mg/dL (ref >39.00)
HDL: 47 mg/dL (ref >39)
LDL Chol Calc (NIH): 69 mg/dL (ref 0–99)
LDL Cholesterol: 66 mg/dL (ref 0–99)
NonHDL: 80.2
Total CHOL/HDL Ratio: 3
Triglycerides: 70 mg/dL (ref 0.0–149.0)
Triglycerides: 49 mg/dL (ref 0–149)
VLDL Cholesterol Cal: 11 mg/dL (ref 5–40)
VLDL: 14 mg/dL (ref 0.0–40.0)

## 2022-09-18 LAB — CBC WITH DIFFERENTIAL/PLATELET
Basophils Absolute: 0.1 10*3/uL (ref 0.0–0.1)
Basophils Relative: 0.7 % (ref 0.0–3.0)
Eosinophils Absolute: 0.3 10*3/uL (ref 0.0–0.7)
Eosinophils Relative: 3.7 % (ref 0.0–5.0)
HCT: 39.7 % (ref 39.0–52.0)
Hemoglobin: 13 g/dL (ref 13.0–17.0)
Lymphocytes Relative: 19.4 % (ref 12.0–46.0)
Lymphs Abs: 1.7 10*3/uL (ref 0.7–4.0)
MCHC: 32.8 g/dL (ref 30.0–36.0)
MCV: 89.9 fl (ref 78.0–100.0)
Monocytes Absolute: 0.7 10*3/uL (ref 0.1–1.0)
Monocytes Relative: 8.4 % (ref 3.0–12.0)
Neutro Abs: 5.8 10*3/uL (ref 1.4–7.7)
Neutrophils Relative %: 67.8 % (ref 43.0–77.0)
Platelets: 190 10*3/uL (ref 150.0–400.0)
RBC: 4.42 Mil/uL (ref 4.22–5.81)
RDW: 14.4 % (ref 11.5–15.5)
WBC: 8.6 10*3/uL (ref 4.0–10.5)

## 2022-09-18 LAB — TSH: TSH: 1.5 u[IU]/mL (ref 0.35–5.50)

## 2022-09-18 LAB — HEMOGLOBIN A1C: Hgb A1c MFr Bld: 6.3 % (ref 4.6–6.5)

## 2022-09-18 NOTE — Patient Instructions (Signed)
    Follow-Up: At [facility], you and your health needs are our priority.  As part of our continuing mission to provide you with exceptional heart care, we have created designated Provider Care Teams.  These Care Teams include your primary Cardiologist (physician) and Advanced Practice Providers (APPs -  Physician Assistants and Nurse Practitioners) who all work together to provide you with the care you need, when you need it.  We recommend signing up for the patient portal called "MyChart".  Sign up information is provided on this After Visit Summary.  MyChart is used to connect with patients for Virtual Visits (Telemedicine).  Patients are able to view lab/test results, encounter notes, upcoming appointments, etc.  Non-urgent messages can be sent to your provider as well.   To learn more about what you can do with MyChart, go to [email or web address].    Your next appointment:   9 month(s)  Provider:   Thomasene Ripple
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
## 2022-09-20 ENCOUNTER — Encounter: Payer: Self-pay | Admitting: Cardiology
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
## 2022-09-28 ENCOUNTER — Emergency Department (HOSPITAL_BASED_OUTPATIENT_CLINIC_OR_DEPARTMENT_OTHER): Payer: Medicare HMO

## 2022-09-28 ENCOUNTER — Emergency Department (HOSPITAL_BASED_OUTPATIENT_CLINIC_OR_DEPARTMENT_OTHER)
Admission: EM | Admit: 2022-09-28 | Discharge: 2022-09-28 | Disposition: A | Discharge status: Home / Self Care | Payer: Medicare HMO | Source: Home / Self Care | Attending: Emergency Medicine | Admitting: Emergency Medicine

## 2022-09-28 ENCOUNTER — Encounter (HOSPITAL_BASED_OUTPATIENT_CLINIC_OR_DEPARTMENT_OTHER): Payer: Self-pay | Admitting: Emergency Medicine

## 2022-09-28 ENCOUNTER — Other Ambulatory Visit: Payer: Self-pay

## 2022-09-28 DIAGNOSIS — S0993XA Unspecified injury of face, initial encounter: Secondary | ICD-10-CM | POA: Diagnosis present

## 2022-09-28 DIAGNOSIS — W010XXA Fall on same level from slipping, tripping and stumbling without subsequent striking against object, initial encounter: Secondary | ICD-10-CM | POA: Diagnosis not present

## 2022-09-28 DIAGNOSIS — S025XXA Fracture of tooth (traumatic), initial encounter for closed fracture: Secondary | ICD-10-CM | POA: Diagnosis not present

## 2022-09-28 DIAGNOSIS — Y9301 Activity, walking, marching and hiking: Secondary | ICD-10-CM | POA: Diagnosis not present

## 2022-09-28 DIAGNOSIS — Z23 Encounter for immunization: Secondary | ICD-10-CM | POA: Insufficient documentation

## 2022-09-28 DIAGNOSIS — S61412A Laceration without foreign body of left hand, initial encounter: Secondary | ICD-10-CM | POA: Diagnosis not present

## 2022-09-28 DIAGNOSIS — Y93H2 Activity, gardening and landscaping: Secondary | ICD-10-CM | POA: Diagnosis not present

## 2022-09-28 DIAGNOSIS — W19XXXA Unspecified fall, initial encounter: Secondary | ICD-10-CM

## 2022-09-28 DIAGNOSIS — Z79899 Other long term (current) drug therapy: Secondary | ICD-10-CM | POA: Insufficient documentation

## 2022-09-28 DIAGNOSIS — I10 Essential (primary) hypertension: Secondary | ICD-10-CM | POA: Diagnosis not present

## 2022-09-28 DIAGNOSIS — Z7982 Long term (current) use of aspirin: Secondary | ICD-10-CM | POA: Diagnosis not present

## 2022-09-28 DIAGNOSIS — M19042 Primary osteoarthritis, left hand: Secondary | ICD-10-CM | POA: Diagnosis not present

## 2022-09-28 MED ORDER — LIDOCAINE-EPINEPHRINE-TETRACAINE (LET) TOPICAL GEL
3.0000 mL | Freq: Once | TOPICAL | Status: AC
  Administered 2022-09-28: 3 mL via TOPICAL
  Filled 2022-09-28: qty 3

## 2022-09-28 MED ORDER — TETANUS-DIPHTH-ACELL PERTUSSIS 5-2.5-18.5 LF-MCG/0.5 IM SUSY
0.5000 mL | PREFILLED_SYRINGE | Freq: Once | INTRAMUSCULAR | Status: AC
  Administered 2022-09-28: 0.5 mL via INTRAMUSCULAR
  Filled 2022-09-28: qty 0.5

## 2022-09-28 NOTE — ED Provider Notes (Signed)
[place] [facility] DEPART[facility] AT [facility] Provider Note   CSN: [id number] Arrival date & time: 09/28/22  1436     History  Chief Complaint  Patient presents with   Matthew [name] is a 87 y.o. male with PMHx GERD, HLD, HTN who presents to ED after mechanical fall. Patient endorses tripping and hitting his mouth on the sidewalk while watering flowers today. Denies blood thinners. Patient states he feels good overall and wanted his left hand laceration repaired since it was bleeding a lot.  Denies LOC, vision changes, seizures. Denies chest pain, dyspnea, nausea, vomiting, abdominal pain, joint pain.    Fall       Home Medications Prior to Admission medications   Medication Sig Start Date End Date Taking? Authorizing Provider  amLODipine (NORVASC) 5 MG tablet Take 5 mg by mouth daily.    [provider]  Ascorbic Acid (VITAMIN C) 1000 MG tablet Take 1,000 mg by mouth daily.    [provider]  aspirin EC (ASPIRIN ADULT LOW STRENGTH) 81 MG tablet TAKE ONE (1) TABLET BY MOUTH EVERY. DAY.SWALLOW WHOLE. 01/05/22   Tobb, Kardie, DO  atorvastatin (LIPITOR) 40 MG tablet Take 40 mg by mouth daily.    [provider]  azelastine (ASTELIN) 0.1 % nasal spray Place 2 sprays into both nostrils 2 (two) times daily. Use in each nostril as directed 04/13/22   Bradd Canary, MD  Cholecalciferol (VITAMIN D3) 50 MCG (2000 UT) TABS Take 2,000 Units by mouth daily.    [provider]  Evolocumab (REPATHA SURECLICK) 140 MG/ML SOAJ Inject 140 mg into the skin every 14 (fourteen) days. 04/17/22   Tobb, Kardie, DO  ezetimibe (ZETIA) 10 MG tablet TAKE 1 TABLET BY MOUTH DAILY 09/29/21   Tobb, Kardie, DO  famotidine (PEPCID) 40 MG tablet TAKE 1 TABLET BY MOUTH IN THE MORNING AND IN THE EVENING 01/12/22   Nandigam, Eleonore Chiquito, MD  fenofibrate micronized (LOFIBRA) 134 MG capsule TAKE 1 CAPSULE EVERY DAY BEFORE BREAKFAST 04/23/22   Bradd Canary, MD   isosorbide mononitrate (IMDUR) 30 MG 24 hr tablet TAKE 1/2 TABLET BY MOUTH DAILY 01/12/22   Tobb, Kardie, DO  losartan (COZAAR) 25 MG tablet TAKE 1 TABLET EVERY DAY 12/09/21   Bradd Canary, MD  metoprolol succinate (TOPROL-XL) 25 MG 24 hr tablet TAKE 1/2 TABLET BY MOUTH DAILY 09/29/21   Tobb, Kardie, DO  Multiple Vitamin (MULTIVITAMIN WITH MINERALS) TABS tablet Take 1 tablet by mouth daily.    [provider]  nitroGLYCERIN (NITROSTAT) 0.4 MG SL tablet Place 0.4 mg under the tongue every 5 (five) minutes as needed for chest pain.    [provider]  nystatin cream (MYCOSTATIN) Apply topically 2 (two) times daily as needed (dermititis). 07/27/22   Bradd Canary, MD  spironolactone (ALDACTONE) 25 MG tablet Take 0.5 tablets (12.5 mg total) by mouth daily. 07/08/21   Tobb, Kardie, DO      Allergies    Clindamycin/lincomycin, Erythromycin, Levofloxacin, Lisinopril, Penicillins, Rosuvastatin calcium, Sulfonamide derivatives, and Tetracycline hcl    Review of Systems   Review of Systems  Skin:        Laceration; fall    Physical Exam Updated Vital Signs BP (!) 169/82 (BP Location: Left Arm)   Pulse 72   Temp 97.8 F (36.6 C) (Oral)   Resp 18   Ht 5\' 8"  (1.727 m)   Wt 91.2 kg   SpO2 97%   BMI  30.56 kg/m  Physical Exam Vitals and nursing note reviewed.  Constitutional:      General: He is not in acute distress. HENT:     Head: Normocephalic and atraumatic.     Comments: No tenderness to palpation of skull/maxillofacial bones    Ears:     Comments: No hemotympanum    Mouth/Throat:     Mouth: Mucous membranes are moist.      Comments: 2 left sided anterior permanent veneers chipped. No active oral bleeding or tongue lacerations. Eyes:     General: No scleral icterus.       Right eye: No discharge.        Left eye: No discharge.     Conjunctiva/sclera: Conjunctivae normal.  Cardiovascular:     Rate and Rhythm: Normal rate and regular rhythm.     Pulses:  Normal pulses.     Heart sounds: Normal heart sounds. No murmur heard. Pulmonary:     Effort: Pulmonary effort is normal. No respiratory distress.     Breath sounds: Normal breath sounds. No wheezing, rhonchi or rales.  Abdominal:     General: Abdomen is flat. Bowel sounds are normal. There is no distension.     Palpations: Abdomen is soft. There is no mass.     Tenderness: There is no abdominal tenderness.  Musculoskeletal:     Right lower leg: No edema.     Left lower leg: No edema.  Skin:    General: Skin is warm and dry.     Capillary Refill: Capillary refill takes less than 2 seconds.     Findings: No rash.     Comments: 1cm laceration of left palm proximal to little finger. No active bleeding or foreign bodies appreciated. +2 radial pulse; sensation to light touch intact; brisk capillary refill.  Neurological:     General: No focal deficit present.     Mental Status: He is alert and oriented to person, place, and time. Mental status is at baseline.     Cranial Nerves: No cranial nerve deficit.     Sensory: No sensory deficit.     Motor: No weakness.     Comments: GCS 15. Speech is goal oriented. AAOx4. No deficits appreciated to CN III-XII; symmetric eyebrow raise, no facial drooping, tongue midline. Patient has equal grip strength bilaterally with 5/5 strength against resistance in all major muscle groups bilaterally. Sensation to light touch intact. Patient moves extremities without ataxia.  Patient ambulatory with steady gait.   Psychiatric:        Mood and Affect: Mood normal.        Behavior: Behavior normal.     ED Results / Procedures / Treatments   Labs (all labs ordered are listed, but only abnormal results are displayed) Labs Reviewed - No data to display  EKG None  Radiology DG Hand 2 View Left  Result Date: 09/28/2022 CLINICAL DATA:  Laceration to the left hand after fall at the base of the little finger EXAM: LEFT HAND - 2 VIEW COMPARISON:  None Available.  FINDINGS: There is no evidence of fracture or dislocation. Degenerative changes of the hand. Soft tissues are unremarkable. No radiopaque foreign body. IMPRESSION: No radiopaque foreign body. Electronically Signed   By: Agustin Cree M.D.   On: 09/28/2022 16:03    Procedures .Marland KitchenLaceration Repair  Date/Time: 09/28/2022 4:50 PM  Performed by: Dorthy Cooler, PA-C Authorized by: Dorthy Cooler, PA-C   Consent:    Consent obtained:  Verbal  Consent given by:  Patient   Risks, benefits, and alternatives were discussed: yes     Risks discussed:  Infection, need for additional repair, nerve damage, poor wound healing, poor cosmetic result, pain, tendon damage and vascular damage   Alternatives discussed:  No treatment Universal protocol:    Patient identity confirmed:  Verbally with patient Anesthesia:    Anesthesia method:  Topical application   Topical anesthetic:  LET Laceration details:    Location:  Hand   Hand location:  L palm   Length (cm):  1 Pre-procedure details:    Preparation:  Imaging obtained to evaluate for foreign bodies Exploration:    Hemostasis achieved with:  Direct pressure   Imaging obtained: x-ray     Imaging outcome: foreign body not noted   Treatment:    Area cleansed with:  Povidone-iodine and saline   Amount of cleaning:  Standard   Irrigation solution:  Sterile saline   Irrigation volume:  Skin repair:    Repair method:  Sutures   Suture size:  4-0   Suture material:  Prolene   Suture technique:  Simple interrupted   Number of sutures:  2 Approximation:    Approximation:  Close Repair type:    Repair type:  Simple Post-procedure details:    Dressing:  Antibiotic ointment and non-adherent dressing   Procedure completion:  Tolerated well, no immediate complications     Medications Ordered in ED Medications  lidocaine-EPINEPHrine-tetracaine (LET) topical gel (3 mLs Topical Given 09/28/22 1535)  Tdap (BOOSTRIX) injection 0.5 mL (0.5  mLs Intramuscular Given 09/28/22 1533)    ED Course/ Medical Decision Making/ A&P                                 Medical Decision Making  This patient presents to the ED after a fall, this involves an extensive number of treatment options, and is a complaint that carries with it a high risk of complications and morbidity.  The differential diagnosis includes  intracranial hemorrhage, subdural/epidural hematoma, vertebral fracture, spinal cord injury, muscle strain, skull fracture, fracture.   Co morbidities that complicate the patient evaluation  HTN, HLD, GERD    Problem List / ED Course / Critical interventions / Medication management  Patient presented for Mechanical Fall. Patient with stable vitals and does not appear to be in distress.  Patient denying any alarming symptoms such as headache, vision changes, loss of consciousness, seizures, nausea, vomiting.  Denies blood thinners.  Patient stating that the only part of his head that hit the ground was his mouth.  Patient stating that he is overall without symptoms. No skull/maxillofacial bone tenderness to palpation.  Ear exam without hemotympanum.  Patient ambulatory without difficulties.  Physical exam with 1 cm laceration to palm of left hand.  No active bleeding or foreign bodies appreciated.  Physical exam also with 2 chipped anterior permanent veneers.  No active oral bleeding.  Patient stating that he has a Education officer, community in town that he is able to follow-up with given his chipped veneers. Patient declining head imaging or blood labs today, stating that he just got blood labs at his PCP, currently feels fine, and just wants stitching in his 1cm laceration on his left hand.  As for patient's laceration - they are neurovascularly intact. Tetanus is provided in ED today. Patient is in no distress. Xray without concern for foreign body. Laceration will be repaired with standard  wound care procedures and antibiotic ointment. The laceration is  not through infected skin, associated with deep puncture wounds, >8hrs old, or grossly contaminated with foreign debris, so I will be using sutures for primary closure. Patient educated about specific return precautions for given chief complaint and symptoms. Laceration repaired without complication and with pulse, motor, sensation intact. Patient is stable for discharge at this time. Patient expressed understanding of return precautions and need for follow-up.  All education provided in verbal form with additional information in written form. Time was allowed for answering of patient questions.  Patient will be encouraged to follow-up with primary care provider to be reevaluated in the next few days.  I have reviewed the patients home medicines and have made adjustments as needed Patient was given return precautions. Patient stable for discharge at this time. Patient verbalized understanding of plan.   DDx: These are considered less likely due to history of present illness and physical exam findings -Intracranial hemorrhage, subdural/epidural hematoma: Canadian head CT score of 0, no neurodeficits -Vertebral fracture: No seatbelt sign, no midline tenderness, no step-off/crepitus/abnormalities palpated -Spinal cord injury: Nexus C-spine and Canadian head CT score of 0, no neurodeficits -Skull fracture: No postauricular ecchymosis, no periorbital ecchymosis, no hemotympanum -Fracture: No step-offs/crepitus/abnormalities palpated in head, neck, chest, upper extremities, lower extremities, pelvis   Social Determinants of Health:  None              Final Clinical Impression(s) / ED Diagnoses Final diagnoses:  Laceration of left hand without foreign body, initial encounter  Fall, initial encounter  Closed fracture of tooth, initial encounter    Rx / DC Orders ED Discharge Orders     None         Margarita Rana 09/28/22 1652    Benjiman Core, MD 09/28/22  2314

## 2022-09-28 NOTE — ED Notes (Signed)
Driver from [employer] - to pick patient up.

## 2022-09-28 NOTE — ED Triage Notes (Addendum)
Pt was watering flowers today and slipped.  Pt fell, hit his mouth on sidewalk.  No LOC.  Pt has wound to left hand

## 2022-09-28 NOTE — Discharge Instructions (Addendum)
It was a pleasure caring for you today.   Please follow-up with your dentist in relation to your chipped veneers.  Seek emergency care if experiencing any new or worsening symptoms.  Non-asorbable sutures: Keep sutures dry. Please refrain from swimming or bathing. Area can be gently cleaned with mild soap and water after 24 hours. Please return to local urgent care or primary care in 10 to 14 days for suture removal. Failure to remove sutures in timely manner can result in infection.

## 2022-10-06 ENCOUNTER — Ambulatory Visit (INDEPENDENT_AMBULATORY_CARE_PROVIDER_SITE_OTHER): Payer: Medicare HMO | Admitting: Family

## 2022-10-06 ENCOUNTER — Other Ambulatory Visit: Payer: Self-pay | Admitting: Cardiology

## 2022-10-06 VITALS — BP 127/59 | HR 81 | Temp 98.0°F | Resp 16 | Wt 203.0 lb

## 2022-10-06 DIAGNOSIS — S61412A Laceration without foreign body of left hand, initial encounter: Secondary | ICD-10-CM | POA: Insufficient documentation

## 2022-10-06 NOTE — Assessment & Plan Note (Signed)
Appears to be healing well. Given the palm location will bring him back on Monday for suture removal.
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
## 2022-10-12 ENCOUNTER — Ambulatory Visit (INDEPENDENT_AMBULATORY_CARE_PROVIDER_SITE_OTHER): Payer: Medicare HMO | Admitting: Family

## 2022-10-12 VITALS — BP 138/65 | HR 64 | Temp 97.6°F | Resp 18 | Ht 68.0 in | Wt 203.4 lb

## 2022-10-12 DIAGNOSIS — Z87828 Personal history of other (healed) physical injury and trauma: Secondary | ICD-10-CM | POA: Diagnosis not present
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
## 2022-10-12 NOTE — Assessment & Plan Note (Signed)
Well healed.  Sutures have been in place for 14 days.  2 sutures removed. Pt tolerated removal without difficulty.

## 2022-10-15 ENCOUNTER — Telehealth: Payer: Self-pay | Admitting: Cardiology

## 2022-10-15 NOTE — Telephone Encounter (Signed)
Noted  

## 2022-10-15 NOTE — Telephone Encounter (Signed)
Pt c/o medication issue:  1. Name of Medication:  Evolocumab (REPATHA SURECLICK) 140 MG/ML SOAJ  2. How are you currently taking this medication (dosage and times per day)?   3. Are you having a reaction (difficulty breathing--STAT)?   4. What is your medication issue?   Patient states he just spoke with a Repatha representative. He states, per the representative, they plan to notify [employer] that they owe him a replacement injection because one dose was ineffective. Patient states they will also notify the office/DO and this is just an FYI that [employer] will be calling the office.

## 2022-10-16 MED ORDER — REPATHA SURECLICK 140 MG/ML ~~LOC~~ SOAJ: 140.0000 mg | SUBCUTANEOUS | 0 refills | Status: DC

## 2022-10-16 NOTE — Telephone Encounter (Signed)
Received fax from [facility] pharmacy to replace 1 Repatha pen. Rx has been e-scribed.

## 2022-10-19 ENCOUNTER — Other Ambulatory Visit: Payer: Self-pay | Admitting: Nurse Practitioner

## 2022-10-20 ENCOUNTER — Telehealth: Payer: Self-pay | Admitting: Family Medicine

## 2022-10-20 NOTE — Telephone Encounter (Signed)
Patient called and would like a call back from nurse to let him know which vaccines he and his wife have had or have not had. Please call

## 2022-10-20 NOTE — Telephone Encounter (Signed)
Called pt was advised that they have had 4 Covid shot.Told pt that Dr.Blyth recommend new Covid shot and Flu. Pt stated he understand.

## 2022-10-29 ENCOUNTER — Ambulatory Visit: Payer: Medicare HMO | Admitting: Podiatry

## 2022-10-29 ENCOUNTER — Encounter: Payer: Self-pay | Admitting: Podiatry

## 2022-10-29 ENCOUNTER — Ambulatory Visit: Payer: Medicare HMO | Admitting: Family Medicine

## 2022-10-29 DIAGNOSIS — M79674 Pain in right toe(s): Secondary | ICD-10-CM

## 2022-10-29 DIAGNOSIS — B351 Tinea unguium: Secondary | ICD-10-CM

## 2022-10-29 DIAGNOSIS — M79675 Pain in left toe(s): Secondary | ICD-10-CM | POA: Diagnosis not present

## 2022-10-30 ENCOUNTER — Ambulatory Visit: Payer: Medicare HMO | Admitting: Podiatry

## 2022-11-01 NOTE — Progress Notes (Signed)
He presents today chief complaint of painful elongated toenails 1 through 5 bilaterally.  Objective: Toenails are long thick yellow dystrophic clinical mycotic with benign skin lesions.  Assessment: Pain in limb secondary onychomycosis benign skin lesions.  Plan: Debridement of benign skin lesion debridement of toenails 1 through 5 bilateral

## 2022-11-30 ENCOUNTER — Ambulatory Visit: Payer: Medicare HMO | Admitting: Family Medicine

## 2022-12-09 DIAGNOSIS — L821 Other seborrheic keratosis: Secondary | ICD-10-CM | POA: Diagnosis not present

## 2022-12-09 DIAGNOSIS — C44519 Basal cell carcinoma of skin of other part of trunk: Secondary | ICD-10-CM | POA: Diagnosis not present

## 2022-12-09 DIAGNOSIS — D485 Neoplasm of uncertain behavior of skin: Secondary | ICD-10-CM | POA: Diagnosis not present

## 2022-12-09 DIAGNOSIS — L570 Actinic keratosis: Secondary | ICD-10-CM | POA: Diagnosis not present

## 2022-12-09 DIAGNOSIS — L814 Other melanin hyperpigmentation: Secondary | ICD-10-CM | POA: Diagnosis not present

## 2022-12-09 DIAGNOSIS — Z85828 Personal history of other malignant neoplasm of skin: Secondary | ICD-10-CM | POA: Diagnosis not present

## 2022-12-25 DIAGNOSIS — H04123 Dry eye syndrome of bilateral lacrimal glands: Secondary | ICD-10-CM | POA: Diagnosis not present

## 2022-12-25 DIAGNOSIS — H35033 Hypertensive retinopathy, bilateral: Secondary | ICD-10-CM | POA: Diagnosis not present

## 2022-12-25 DIAGNOSIS — H353131 Nonexudative age-related macular degeneration, bilateral, early dry stage: Secondary | ICD-10-CM | POA: Diagnosis not present

## 2022-12-25 DIAGNOSIS — Z961 Presence of intraocular lens: Secondary | ICD-10-CM | POA: Diagnosis not present

## 2022-12-25 DIAGNOSIS — H35373 Puckering of macula, bilateral: Secondary | ICD-10-CM | POA: Diagnosis not present

## 2022-12-27 NOTE — Assessment & Plan Note (Signed)
Well controlled, no changes to meds. Encouraged heart healthy diet such as the DASH diet and exercise as tolerated.  °

## 2022-12-27 NOTE — Assessment & Plan Note (Signed)
Does not tolerate statins.

## 2022-12-27 NOTE — Assessment & Plan Note (Signed)
hgba1c acceptable, minimize simple carbs. Increase exercise as tolerated.  

## 2022-12-27 NOTE — Assessment & Plan Note (Signed)
Tolerating repatha

## 2022-12-29 ENCOUNTER — Ambulatory Visit: Payer: Medicare HMO | Admitting: Family Medicine

## 2022-12-29 VITALS — BP 146/66 | HR 58 | Temp 97.6°F | Resp 16 | Ht 68.0 in | Wt 204.6 lb

## 2022-12-29 DIAGNOSIS — R739 Hyperglycemia, unspecified: Secondary | ICD-10-CM

## 2022-12-29 DIAGNOSIS — I10 Essential (primary) hypertension: Secondary | ICD-10-CM

## 2022-12-29 DIAGNOSIS — T466X5A Adverse effect of antihyperlipidemic and antiarteriosclerotic drugs, initial encounter: Secondary | ICD-10-CM | POA: Diagnosis not present

## 2022-12-29 DIAGNOSIS — E785 Hyperlipidemia, unspecified: Secondary | ICD-10-CM

## 2022-12-29 DIAGNOSIS — M791 Myalgia, unspecified site: Secondary | ICD-10-CM | POA: Diagnosis not present

## 2022-12-30 ENCOUNTER — Encounter: Payer: Self-pay | Admitting: Family Medicine
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
## 2023-01-01 ENCOUNTER — Other Ambulatory Visit (HOSPITAL_BASED_OUTPATIENT_CLINIC_OR_DEPARTMENT_OTHER): Payer: Self-pay

## 2023-01-01 MED ORDER — RSVPREF3 VAC RECOMB ADJUVANTED 120 MCG/0.5ML IM SUSR
0.5000 mL | Freq: Once | INTRAMUSCULAR | 0 refills | Status: AC
  Filled 2023-01-01: qty 0.5, 1d supply, fill #0

## 2023-01-04 ENCOUNTER — Other Ambulatory Visit: Payer: Self-pay | Admitting: Cardiology

## 2023-01-07 ENCOUNTER — Other Ambulatory Visit: Payer: Self-pay

## 2023-01-07 ENCOUNTER — Telehealth: Payer: Self-pay | Admitting: Cardiology

## 2023-01-07 MED ORDER — SPIRONOLACTONE 25 MG PO TABS: 12.5000 mg | ORAL_TABLET | Freq: Every day | ORAL | 1 refills | Status: DC

## 2023-01-07 MED ORDER — ISOSORBIDE MONONITRATE ER 30 MG PO TB24: 15.0000 mg | ORAL_TABLET | Freq: Every day | ORAL | 2 refills | Status: DC

## 2023-01-07 NOTE — Telephone Encounter (Signed)
 Rx sent to requested Pharmacy.

## 2023-01-07 NOTE — Telephone Encounter (Signed)
*  STAT* If patient is at the pharmacy, call can be transferred to refill team.   1. Which medications need to be refilled? (please list name of each medication and dose if known)  isosorbide mononitrate (IMDUR) 30 MG 24 hr tablet             spironolactone (ALDACTONE) 25 MG tablet  2. Which pharmacy/location (including street and city if local pharmacy) is medication to be sent to? [facility] - [place], [place] - [address]   3. Do they need a 30 day or 90 day supply?  90 day supply

## 2023-01-07 NOTE — Telephone Encounter (Signed)
 RX sent to requested Pharmacy

## 2023-01-11 ENCOUNTER — Telehealth: Payer: Self-pay | Admitting: *Deleted

## 2023-01-11 NOTE — Telephone Encounter (Signed)
Left message on machine at [facility] [place] for them to send records of Covid and Flu Vaccines.

## 2023-01-25 ENCOUNTER — Other Ambulatory Visit: Payer: Self-pay | Admitting: Nurse Practitioner

## 2023-02-02 ENCOUNTER — Ambulatory Visit: Payer: Medicare HMO | Admitting: Podiatry

## 2023-02-02 DIAGNOSIS — M79674 Pain in right toe(s): Secondary | ICD-10-CM | POA: Diagnosis not present

## 2023-02-02 DIAGNOSIS — L989 Disorder of the skin and subcutaneous tissue, unspecified: Secondary | ICD-10-CM

## 2023-02-02 DIAGNOSIS — L84 Corns and callosities: Secondary | ICD-10-CM | POA: Diagnosis not present

## 2023-02-02 DIAGNOSIS — B351 Tinea unguium: Secondary | ICD-10-CM

## 2023-02-02 DIAGNOSIS — M79675 Pain in left toe(s): Secondary | ICD-10-CM

## 2023-02-02 NOTE — Progress Notes (Signed)
 He presents today with his wife chief complaint of painful elongated toenails and calluses.  Objective: Pulses are palpable.  There is no erythema edema salines drainage or odor multiple callosities to the plantar aspect of the bilateral foot benign in nature.  No open lesions or wounds.  Toenails are long thick yellow dystrophic onychomycotic.  Assessment: Pain in limb secondary to benign skin lesions and mycotic nails.  Plan: Debridement of toenails 1 through 5 bilateral.

## 2023-02-08 ENCOUNTER — Encounter: Payer: Self-pay | Admitting: Family Medicine

## 2023-02-08 ENCOUNTER — Ambulatory Visit (INDEPENDENT_AMBULATORY_CARE_PROVIDER_SITE_OTHER): Payer: Medicare HMO | Admitting: Family Medicine

## 2023-02-08 ENCOUNTER — Ambulatory Visit: Payer: Self-pay | Admitting: Family Medicine

## 2023-02-08 ENCOUNTER — Ambulatory Visit (HOSPITAL_BASED_OUTPATIENT_CLINIC_OR_DEPARTMENT_OTHER)
Admission: RE | Admit: 2023-02-08 | Discharge: 2023-02-08 | Disposition: A | Discharge status: Home / Self Care | Payer: Medicare HMO | Source: Ambulatory Visit | Attending: Family Medicine | Admitting: Family Medicine

## 2023-02-08 ENCOUNTER — Other Ambulatory Visit: Payer: Self-pay | Admitting: Family Medicine

## 2023-02-08 VITALS — BP 176/76 | HR 62 | Temp 98.1°F | Ht 68.0 in | Wt 203.2 lb

## 2023-02-08 DIAGNOSIS — R059 Cough, unspecified: Secondary | ICD-10-CM | POA: Diagnosis not present

## 2023-02-08 DIAGNOSIS — J40 Bronchitis, not specified as acute or chronic: Secondary | ICD-10-CM

## 2023-02-08 DIAGNOSIS — J439 Emphysema, unspecified: Secondary | ICD-10-CM | POA: Diagnosis not present

## 2023-02-08 MED ORDER — GUAIFENESIN ER 600 MG PO TB12: 1200.0000 mg | ORAL_TABLET | Freq: Two times a day (BID) | ORAL | 0 refills | Status: DC

## 2023-02-08 MED ORDER — CEFDINIR 300 MG PO CAPS: 300.0000 mg | ORAL_CAPSULE | Freq: Two times a day (BID) | ORAL | 0 refills | Status: DC
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
## 2023-02-08 NOTE — Telephone Encounter (Signed)
 Copied from CRM [id number]. Topic: Clinical - Red Word Triage >> Feb 08, 2023  8:15 AM Eleanor C wrote: Red Word that prompted transfer to Nurse Triage: patient has an appointment with his doctor on 4/3 but he is calling because he is still coughing up yellow mucus and feeling bad. Very concerned   Chief Complaint: Cough x 4 weeks Symptoms: runny nose, sore throat Frequency: constant Pertinent Negatives: Patient denies chest pain Disposition: [] ED /[] Urgent Care (no appt availability in office) / [x] Appointment(In office/virtual)/ []  [facility]/ [] Home Care/ [] Refused Recommended Disposition /[] [facility]/ []  Follow-up with PCP Additional Notes: Patient reports cough x 4-5 weeks, states he has been taking cough medicine and tylenol  with no relief. Pt has appt in April,  but requesting office visit sooner to address cough. Appt scheduled for today at 10:40   Reason for Disposition  Cough has been present for > 3 weeks  Answer Assessment - Initial Assessment Questions 1. ONSET: When did the cough begin?      4-5 weeks ago  2. SEVERITY: How bad is the cough today?      *No Answer* 3. SPUTUM: Describe the color of your sputum (none, dry cough; clear, white, yellow, green)     Yellow sputum, thick in morning and clear through out the day  4. HEMOPTYSIS: Are you coughing up any blood? If so ask: How much? (flecks, streaks, tablespoons, etc.)     No blood  5. DIFFICULTY BREATHING: Are you having difficulty breathing? If Yes, ask: How bad is it? (e.g., mild, moderate, severe)    - MILD: No SOB at rest, mild SOB with walking, speaks normally in sentences, can lie down, no retractions, pulse < 100.    - MODERATE: SOB at rest, SOB with minimal exertion and prefers to sit, cannot lie down flat, speaks in phrases, mild retractions, audible wheezing, pulse 100-120.    - SEVERE: Very SOB at rest, speaks in single words, struggling to breathe, sitting hunched  forward, retractions, pulse > 120      No shortness of breath  6. FEVER: Do you have a fever? If Yes, ask: What is your temperature, how was it measured, and when did it start?     No fevers, but takes 2 tylenol  every 4 hours for aches   7. CARDIAC HISTORY: Do you have any history of heart disease? (e.g., heart attack, congestive heart failure)      Has a cardiologist, but not diagnosis  8. LUNG HISTORY: Do you have any history of lung disease?  (e.g., pulmonary embolus, asthma, emphysema)     No lung disease  9. PE RISK FACTORS: Do you have a history of blood clots? (or: recent major surgery, recent prolonged travel, bedridden)     No 10. OTHER SYMPTOMS: Do you have any other symptoms? (e.g., runny nose, wheezing, chest pain)       Runny nose, sore throat,  11. PREGNANCY: Is there any chance you are pregnant? When was your last menstrual period?       N?a 12. TRAVEL: Have you traveled out of the country in the last month? (e.g., travel history, exposures)       No exposure that he is aware  Protocols used: Cough - Acute Productive-A-AH
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
## 2023-02-09 ENCOUNTER — Other Ambulatory Visit: Payer: Self-pay | Admitting: Family Medicine

## 2023-02-09 DIAGNOSIS — I10 Essential (primary) hypertension: Secondary | ICD-10-CM

## 2023-02-11 ENCOUNTER — Telehealth: Payer: Self-pay | Admitting: Cardiology

## 2023-02-11 NOTE — Telephone Encounter (Signed)
 Patient identification verified by 2 forms. Bertina Cooks, RN    Called and spoke to patient  Patient states:   -has no contacted insurance to review plan/deductible  Informed patient:   -Likely cost of $297 is ($250 for deductible +$47 for medication)   -should contact insurance to confirm if this is due to deductible   -even if medication is changed, he will still need to meet insurance deductible  Patient verbalized understanding, states will contact insurance

## 2023-02-11 NOTE — Telephone Encounter (Signed)
 Pt c/o medication issue:  1. Name of Medication: Evolocumab  (REPATHA  SURECLICK) 140 MG/ML SOAJ   2. How are you currently taking this medication (dosage and times per day)? Inject 140 mg into the skin every 14 (fourteen) days.   3. Are you having a reaction (difficulty breathing--STAT)? No   4. What is your medication issue? Patient called and said that he is no longer taking medication due to price being $297 from $45 last year. Wanted to make Dr. Sheena aware and would like an alternative to Repatha  for a cheaper price

## 2023-03-22 ENCOUNTER — Other Ambulatory Visit: Payer: Self-pay | Admitting: Cardiology

## 2023-03-22 DIAGNOSIS — E785 Hyperlipidemia, unspecified: Secondary | ICD-10-CM

## 2023-03-22 DIAGNOSIS — I2510 Atherosclerotic heart disease of native coronary artery without angina pectoris: Secondary | ICD-10-CM

## 2023-03-29 ENCOUNTER — Other Ambulatory Visit: Payer: Self-pay | Admitting: Cardiology

## 2023-03-29 ENCOUNTER — Other Ambulatory Visit: Payer: Self-pay | Admitting: Family Medicine

## 2023-05-04 ENCOUNTER — Ambulatory Visit: Payer: Medicare HMO | Admitting: Podiatry

## 2023-05-04 ENCOUNTER — Encounter: Payer: Self-pay | Admitting: Podiatry

## 2023-05-04 DIAGNOSIS — B351 Tinea unguium: Secondary | ICD-10-CM

## 2023-05-04 DIAGNOSIS — M79675 Pain in left toe(s): Secondary | ICD-10-CM

## 2023-05-04 DIAGNOSIS — D2371 Other benign neoplasm of skin of right lower limb, including hip: Secondary | ICD-10-CM | POA: Diagnosis not present

## 2023-05-04 DIAGNOSIS — M79674 Pain in right toe(s): Secondary | ICD-10-CM | POA: Diagnosis not present

## 2023-05-04 DIAGNOSIS — D2372 Other benign neoplasm of skin of left lower limb, including hip: Secondary | ICD-10-CM | POA: Diagnosis not present

## 2023-05-04 NOTE — Assessment & Plan Note (Signed)
 Tolerating repatha

## 2023-05-04 NOTE — Assessment & Plan Note (Signed)
 Well controlled, no changes to meds. Encouraged heart healthy diet such as the DASH diet and exercise as tolerated.

## 2023-05-04 NOTE — Progress Notes (Signed)
 He presents today with his wife chief complaint of painful elongated toenails and calluses.  Objective: Pulses are palpable.  There is no erythema edema salines drainage or odor multiple callosities to the plantar aspect of the bilateral foot benign in nature.  No open lesions or wounds.  Toenails are long thick yellow dystrophic onychomycotic.  Assessment: Pain in limb secondary to benign skin lesions and mycotic nails.  Plan: Debridement of toenails 1 through 5 bilateral.

## 2023-05-04 NOTE — Assessment & Plan Note (Signed)
 Encouraged DASH or MIND diet, decrease po intake and increase exercise as tolerated. Needs 7-8 hours of sleep nightly. Avoid trans fats, eat small, frequent meals every 4-5 hours with lean proteins, complex carbs and healthy fats. Minimize simple carbs, high fat foods and processed foods

## 2023-05-04 NOTE — Assessment & Plan Note (Signed)
 hgba1c acceptable, minimize simple carbs. Increase exercise as tolerated.

## 2023-05-04 NOTE — Assessment & Plan Note (Signed)
 Does not tolerate statins.

## 2023-05-06 ENCOUNTER — Ambulatory Visit (INDEPENDENT_AMBULATORY_CARE_PROVIDER_SITE_OTHER): Payer: Medicare HMO | Admitting: Family Medicine

## 2023-05-06 VITALS — BP 134/72 | HR 63 | Temp 98.1°F | Resp 16 | Ht 68.0 in | Wt 201.6 lb

## 2023-05-06 DIAGNOSIS — R0600 Dyspnea, unspecified: Secondary | ICD-10-CM | POA: Diagnosis not present

## 2023-05-06 DIAGNOSIS — M791 Myalgia, unspecified site: Secondary | ICD-10-CM | POA: Diagnosis not present

## 2023-05-06 DIAGNOSIS — I10 Essential (primary) hypertension: Secondary | ICD-10-CM | POA: Diagnosis not present

## 2023-05-06 DIAGNOSIS — E669 Obesity, unspecified: Secondary | ICD-10-CM

## 2023-05-06 DIAGNOSIS — E785 Hyperlipidemia, unspecified: Secondary | ICD-10-CM | POA: Diagnosis not present

## 2023-05-06 DIAGNOSIS — T466X5A Adverse effect of antihyperlipidemic and antiarteriosclerotic drugs, initial encounter: Secondary | ICD-10-CM | POA: Diagnosis not present

## 2023-05-06 DIAGNOSIS — R739 Hyperglycemia, unspecified: Secondary | ICD-10-CM | POA: Diagnosis not present

## 2023-05-06 DIAGNOSIS — R7989 Other specified abnormal findings of blood chemistry: Secondary | ICD-10-CM

## 2023-05-06 NOTE — Patient Instructions (Signed)
 Hypertension, Adult High blood pressure (hypertension) is when the force of blood pumping through the arteries is too strong. The arteries are the blood vessels that carry blood from the heart throughout the body. Hypertension forces the heart to work harder to pump blood and may cause arteries to become narrow or stiff. Untreated or uncontrolled hypertension can lead to a heart attack, heart failure, a stroke, kidney disease, and other problems. A blood pressure reading consists of a higher number over a lower number. Ideally, your blood pressure should be below 120/80. The first ("top") number is called the systolic pressure. It is a measure of the pressure in your arteries as your heart beats. The second ("bottom") number is called the diastolic pressure. It is a measure of the pressure in your arteries as the heart relaxes. What are the causes? The exact cause of this condition is not known. There are some conditions that result in high blood pressure. What increases the risk? Certain factors may make you more likely to develop high blood pressure. Some of these risk factors are under your control, including: Smoking. Not getting enough exercise or physical activity. Being overweight. Having too much fat, sugar, calories, or salt (sodium) in your diet. Drinking too much alcohol. Other risk factors include: Having a personal history of heart disease, diabetes, high cholesterol, or kidney disease. Stress. Having a family history of high blood pressure and high cholesterol. Having obstructive sleep apnea. Age. The risk increases with age. What are the signs or symptoms? High blood pressure may not cause symptoms. Very high blood pressure (hypertensive crisis) may cause: Headache. Fast or irregular heartbeats (palpitations). Shortness of breath. Nosebleed. Nausea and vomiting. Vision changes. Severe chest pain, dizziness, and seizures. How is this diagnosed? This condition is diagnosed by  measuring your blood pressure while you are seated, with your arm resting on a flat surface, your legs uncrossed, and your feet flat on the floor. The cuff of the blood pressure monitor will be placed directly against the skin of your upper arm at the level of your heart. Blood pressure should be measured at least twice using the same arm. Certain conditions can cause a difference in blood pressure between your right and left arms. If you have a high blood pressure reading during one visit or you have normal blood pressure with other risk factors, you may be asked to: Return on a different day to have your blood pressure checked again. Monitor your blood pressure at home for 1 week or longer. If you are diagnosed with hypertension, you may have other blood or imaging tests to help your health care provider understand your overall risk for other conditions. How is this treated? This condition is treated by making healthy lifestyle changes, such as eating healthy foods, exercising more, and reducing your alcohol intake. You may be referred for counseling on a healthy diet and physical activity. Your health care provider may prescribe medicine if lifestyle changes are not enough to get your blood pressure under control and if: Your systolic blood pressure is above 130. Your diastolic blood pressure is above 80. Your personal target blood pressure may vary depending on your medical conditions, your age, and other factors. Follow these instructions at home: Eating and drinking  Eat a diet that is high in fiber and potassium, and low in sodium, added sugar, and fat. An example of this eating plan is called the DASH diet. DASH stands for Dietary Approaches to Stop Hypertension. To eat this way: Eat  plenty of fresh fruits and vegetables. Try to fill one half of your plate at each meal with fruits and vegetables. Eat whole grains, such as whole-wheat pasta, brown rice, or whole-grain bread. Fill about one  fourth of your plate with whole grains. Eat or drink low-fat dairy products, such as skim milk or low-fat yogurt. Avoid fatty cuts of meat, processed or cured meats, and poultry with skin. Fill about one fourth of your plate with lean proteins, such as fish, chicken without skin, beans, eggs, or tofu. Avoid pre-made and processed foods. These tend to be higher in sodium, added sugar, and fat. Reduce your daily sodium intake. Many people with hypertension should eat less than 1,500 mg of sodium a day. Do not drink alcohol if: Your health care provider tells you not to drink. You are pregnant, may be pregnant, or are planning to become pregnant. If you drink alcohol: Limit how much you have to: 0-1 drink a day for women. 0-2 drinks a day for men. Know how much alcohol is in your drink. In the [place] one drink equals one 12 oz bottle of beer (355 mL), one 5 oz glass of wine (148 mL), or one 1 oz glass of hard liquor (44 mL). Lifestyle  Work with your health care provider to maintain a healthy body weight or to lose weight. Ask what an ideal weight is for you. Get at least 30 minutes of exercise that causes your heart to beat faster (aerobic exercise) most days of the week. Activities may include walking, swimming, or biking. Include exercise to strengthen your muscles (resistance exercise), such as Pilates or lifting weights, as part of your weekly exercise routine. Try to do these types of exercises for 30 minutes at least 3 days a week. Do not use any products that contain nicotine or tobacco. These products include cigarettes, chewing tobacco, and vaping devices, such as e-cigarettes. If you need help quitting, ask your health care provider. Monitor your blood pressure at home as told by your health care provider. Keep all follow-up visits. This is important. Medicines Take over-the-counter and prescription medicines only as told by your health care provider. Follow directions carefully. Blood  pressure medicines must be taken as prescribed. Do not skip doses of blood pressure medicine. Doing this puts you at risk for problems and can make the medicine less effective. Ask your health care provider about side effects or reactions to medicines that you should watch for. Contact a health care provider if you: Think you are having a reaction to a medicine you are taking. Have headaches that keep coming back (recurring). Feel dizzy. Have swelling in your ankles. Have trouble with your vision. Get help right away if you: Develop a severe headache or confusion. Have unusual weakness or numbness. Feel faint. Have severe pain in your chest or abdomen. Vomit repeatedly. Have trouble breathing. These symptoms may be an emergency. Get help right away. Call 911. Do not wait to see if the symptoms will go away. Do not drive yourself to the hospital. Summary Hypertension is when the force of blood pumping through your arteries is too strong. If this condition is not controlled, it may put you at risk for serious complications. Your personal target blood pressure may vary depending on your medical conditions, your age, and other factors. For most people, a normal blood pressure is less than 120/80. Hypertension is treated with lifestyle changes, medicines, or a combination of both. Lifestyle changes include losing weight, eating a healthy,  low-sodium diet, exercising more, and limiting alcohol. This information is not intended to replace advice given to you by your health care provider. Make sure you discuss any questions you have with your health care provider. Document Revised: 11/26/2020 Document Reviewed: 11/26/2020 Elsevier Patient Education  2024 [employer].

## 2023-05-07 ENCOUNTER — Encounter: Payer: Self-pay | Admitting: Family Medicine
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
## 2023-05-08 ENCOUNTER — Other Ambulatory Visit: Payer: Self-pay | Admitting: Family Medicine

## 2023-05-08 DIAGNOSIS — I10 Essential (primary) hypertension: Secondary | ICD-10-CM

## 2023-05-10 ENCOUNTER — Other Ambulatory Visit: Payer: Self-pay | Admitting: Nurse Practitioner

## 2023-05-11 ENCOUNTER — Other Ambulatory Visit (INDEPENDENT_AMBULATORY_CARE_PROVIDER_SITE_OTHER)

## 2023-05-11 DIAGNOSIS — T466X5A Adverse effect of antihyperlipidemic and antiarteriosclerotic drugs, initial encounter: Secondary | ICD-10-CM

## 2023-05-11 DIAGNOSIS — M791 Myalgia, unspecified site: Secondary | ICD-10-CM | POA: Diagnosis not present

## 2023-05-11 DIAGNOSIS — I10 Essential (primary) hypertension: Secondary | ICD-10-CM

## 2023-05-11 DIAGNOSIS — E785 Hyperlipidemia, unspecified: Secondary | ICD-10-CM | POA: Diagnosis not present

## 2023-05-11 DIAGNOSIS — R7989 Other specified abnormal findings of blood chemistry: Secondary | ICD-10-CM

## 2023-05-11 DIAGNOSIS — R739 Hyperglycemia, unspecified: Secondary | ICD-10-CM

## 2023-05-11 DIAGNOSIS — R0600 Dyspnea, unspecified: Secondary | ICD-10-CM

## 2023-05-11 LAB — LIPID PANEL
Cholesterol: 139 mg/dL (ref 0–200)
HDL: 41 mg/dL (ref >39.00)
LDL Cholesterol: 81 mg/dL (ref 0–99)
NonHDL: 97.58
Total CHOL/HDL Ratio: 3
Triglycerides: 84 mg/dL (ref 0.0–149.0)
VLDL: 16.8 mg/dL (ref 0.0–40.0)

## 2023-05-11 LAB — COMPREHENSIVE METABOLIC PANEL WITH GFR
ALT: 16 U/L (ref 0–53)
AST: 22 U/L (ref 0–37)
Albumin: 4.3 g/dL (ref 3.5–5.2)
Alkaline Phosphatase: 36 U/L — ABNORMAL LOW (ref 39–117)
BUN: 18 mg/dL (ref 6–23)
CO2: 23 meq/L (ref 19–32)
Calcium: 9.9 mg/dL (ref 8.4–10.5)
Chloride: 106 meq/L (ref 96–112)
Creatinine, Ser: 1.1 mg/dL (ref 0.40–1.50)
GFR: 61.1 mL/min (ref >60.00)
Glucose, Bld: 123 mg/dL — ABNORMAL HIGH (ref 70–99)
Potassium: 4 meq/L (ref 3.5–5.1)
Sodium: 139 meq/L (ref 135–145)
Total Bilirubin: 0.6 mg/dL (ref 0.2–1.2)
Total Protein: 6.7 g/dL (ref 6.0–8.3)

## 2023-05-11 LAB — CBC WITH DIFFERENTIAL/PLATELET
Basophils Absolute: 0 10*3/uL (ref 0.0–0.1)
Basophils Relative: 0.7 % (ref 0.0–3.0)
Eosinophils Absolute: 0.3 10*3/uL (ref 0.0–0.7)
Eosinophils Relative: 3.7 % (ref 0.0–5.0)
HCT: 40 % (ref 39.0–52.0)
Hemoglobin: 13.5 g/dL (ref 13.0–17.0)
Lymphocytes Relative: 16.5 % (ref 12.0–46.0)
Lymphs Abs: 1.2 10*3/uL (ref 0.7–4.0)
MCHC: 33.7 g/dL (ref 30.0–36.0)
MCV: 88.9 fl (ref 78.0–100.0)
Monocytes Absolute: 0.6 10*3/uL (ref 0.1–1.0)
Monocytes Relative: 8.3 % (ref 3.0–12.0)
Neutro Abs: 5.1 10*3/uL (ref 1.4–7.7)
Neutrophils Relative %: 70.8 % (ref 43.0–77.0)
Platelets: 196 10*3/uL (ref 150.0–400.0)
RBC: 4.5 Mil/uL (ref 4.22–5.81)
RDW: 14.2 % (ref 11.5–15.5)
WBC: 7.2 10*3/uL (ref 4.0–10.5)

## 2023-05-11 LAB — MAGNESIUM: Magnesium: 2 mg/dL (ref 1.5–2.5)

## 2023-05-11 LAB — HEMOGLOBIN A1C: Hgb A1c MFr Bld: 6.3 % (ref 4.6–6.5)

## 2023-05-11 NOTE — Telephone Encounter (Signed)
 Medication was refilled. Attempted to contact patient and left a voicemail to return call regarding an appointment.

## 2023-05-12 ENCOUNTER — Encounter: Payer: Self-pay | Admitting: Family Medicine

## 2023-05-12 LAB — TSH: TSH: 2.12 u[IU]/mL (ref 0.35–5.50)

## 2023-05-12 LAB — VITAMIN D 25 HYDROXY (VIT D DEFICIENCY, FRACTURES): VITD: 32.31 ng/mL (ref 30.00–100.00)

## 2023-05-31 ENCOUNTER — Telehealth: Payer: Self-pay

## 2023-05-31 NOTE — Telephone Encounter (Signed)
 Copied from CRM [id number]. Topic: Clinical - Request for Lab/Test Order >> May 28, 2023  8:28 AM Elle L wrote: Reason for CRM: The patient states that he received a letter stating he was approved for an ultrasound of his heart. He is requesting an imaging order and assistance scheduling the appointment. The patient's call back number is [phone]. He is also requesting the name of the Dermatologist that she recommended for him previously. He states he declined at the time as he had a Armed forces operational officer but is now interested.

## 2023-06-01 NOTE — Telephone Encounter (Signed)
 Called patient and no answer left vm to return call

## 2023-06-04 NOTE — Telephone Encounter (Signed)
 Returned patients call and he wanted to know why appointment wasn't scheduled for his Echocardiogram. He was advised that order was placed and the phone number to imaging at the [facility].   Also patient was wanting to know if another referral was placed for his Matthew [name] (wife) to dermatology. Patient was advised that the referral was send to Dr. Fain Home and her office number. He stated if they need to schedule appointment they will reach out to Dr. Fain Home office.

## 2023-06-08 DIAGNOSIS — C44712 Basal cell carcinoma of skin of right lower limb, including hip: Secondary | ICD-10-CM | POA: Diagnosis not present

## 2023-06-08 DIAGNOSIS — Z85828 Personal history of other malignant neoplasm of skin: Secondary | ICD-10-CM | POA: Diagnosis not present

## 2023-06-08 DIAGNOSIS — L905 Scar conditions and fibrosis of skin: Secondary | ICD-10-CM | POA: Diagnosis not present

## 2023-06-08 DIAGNOSIS — L570 Actinic keratosis: Secondary | ICD-10-CM | POA: Diagnosis not present

## 2023-06-08 DIAGNOSIS — L821 Other seborrheic keratosis: Secondary | ICD-10-CM | POA: Diagnosis not present

## 2023-06-08 DIAGNOSIS — D485 Neoplasm of uncertain behavior of skin: Secondary | ICD-10-CM | POA: Diagnosis not present

## 2023-06-08 DIAGNOSIS — D225 Melanocytic nevi of trunk: Secondary | ICD-10-CM | POA: Diagnosis not present

## 2023-07-01 ENCOUNTER — Ambulatory Visit (HOSPITAL_BASED_OUTPATIENT_CLINIC_OR_DEPARTMENT_OTHER)
Admission: RE | Admit: 2023-07-01 | Discharge: 2023-07-01 | Disposition: A | Discharge status: Home / Self Care | Source: Ambulatory Visit | Attending: Family Medicine | Admitting: Family Medicine

## 2023-07-01 DIAGNOSIS — R0600 Dyspnea, unspecified: Secondary | ICD-10-CM | POA: Insufficient documentation

## 2023-07-01 DIAGNOSIS — R0609 Other forms of dyspnea: Secondary | ICD-10-CM

## 2023-07-02 ENCOUNTER — Ambulatory Visit: Payer: Self-pay | Admitting: Family Medicine

## 2023-07-02 LAB — ECHOCARDIOGRAM COMPLETE
AR max vel: 0.85 cm2
AV Area VTI: 0.85 cm2
AV Area mean vel: 0.88 cm2
AV Mean grad: 19.2 mmHg
AV Peak grad: 33.7 mmHg
Ao pk vel: 2.9 m/s
Area-P 1/2: 2.71 cm2
Calc EF: 63 %
S' Lateral: 3.2 cm
Single Plane A2C EF: 60.3 %
Single Plane A4C EF: 63.2 %

## 2023-08-03 ENCOUNTER — Ambulatory Visit: Admitting: Podiatry

## 2023-08-23 ENCOUNTER — Other Ambulatory Visit: Payer: Self-pay | Admitting: Nurse Practitioner

## 2023-08-24 ENCOUNTER — Other Ambulatory Visit: Payer: Self-pay | Admitting: Cardiology

## 2023-08-24 DIAGNOSIS — I2510 Atherosclerotic heart disease of native coronary artery without angina pectoris: Secondary | ICD-10-CM

## 2023-08-24 DIAGNOSIS — E785 Hyperlipidemia, unspecified: Secondary | ICD-10-CM

## 2023-08-24 NOTE — Telephone Encounter (Signed)
 Called and spoke with patient to schedule follow up. Appt has been scheduled for Monday, 09/20/23 at 9 am with [facility], NP. Patient is aware that 1 month refill has been sent in, additional refills can be sent at the time of his follow up appt. Patient verbalized understanding and had no concerns at the end of the call.

## 2023-08-26 NOTE — Telephone Encounter (Signed)
 RX routed to PharmD

## 2023-08-26 NOTE — Telephone Encounter (Signed)
*  STAT* If patient is at the pharmacy, call can be transferred to refill team.   1. Which medications need to be refilled? (please list name of each medication and dose if known) Evolocumab  (REPATHA  SURECLICK) 140 MG/ML SOAJ    2. Would you like to learn more about the convenience, safety, & potential cost savings by using the [facility]?     3. Are you open to using the [facility] (Type [facility]. ).   4. Which pharmacy/location (including street and city if local pharmacy) is medication to be sent to?  [facility] - [place], [place] - [address]     5. Do they need a 30 day or 90 day supply? 30 day Patient is out of medication

## 2023-08-31 ENCOUNTER — Ambulatory Visit: Admitting: Podiatry

## 2023-09-20 ENCOUNTER — Encounter: Payer: Self-pay | Admitting: Gastroenterology

## 2023-09-20 ENCOUNTER — Ambulatory Visit: Admitting: Gastroenterology

## 2023-09-20 VITALS — BP 120/60 | HR 64 | Ht 66.0 in | Wt 198.5 lb

## 2023-09-20 DIAGNOSIS — Z8546 Personal history of malignant neoplasm of prostate: Secondary | ICD-10-CM | POA: Diagnosis not present

## 2023-09-20 DIAGNOSIS — Z87891 Personal history of nicotine dependence: Secondary | ICD-10-CM

## 2023-09-20 DIAGNOSIS — K219 Gastro-esophageal reflux disease without esophagitis: Secondary | ICD-10-CM | POA: Diagnosis not present

## 2023-09-20 MED ORDER — FAMOTIDINE 40 MG PO TABS
40.0000 mg | ORAL_TABLET | Freq: Two times a day (BID) | ORAL | 4 refills | Status: AC
Start: 2023-09-20 — End: ?

## 2023-09-20 NOTE — Progress Notes (Signed)
 I agree with the assessment and plan as outlined by Ms. May.

## 2023-09-20 NOTE — Patient Instructions (Signed)
 Continue GERD diet Continue famotidine  40mg  twice daily Matthew [name] consider taking medications with food Avoid carbonated drinks, using straw, or eating too quickly
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
## 2023-09-21 ENCOUNTER — Encounter: Payer: Self-pay | Admitting: Podiatry

## 2023-09-21 ENCOUNTER — Ambulatory Visit: Admitting: Podiatry

## 2023-09-21 DIAGNOSIS — M79675 Pain in left toe(s): Secondary | ICD-10-CM

## 2023-09-21 DIAGNOSIS — B351 Tinea unguium: Secondary | ICD-10-CM

## 2023-09-21 DIAGNOSIS — D2371 Other benign neoplasm of skin of right lower limb, including hip: Secondary | ICD-10-CM

## 2023-09-21 DIAGNOSIS — M79674 Pain in right toe(s): Secondary | ICD-10-CM | POA: Diagnosis not present

## 2023-09-21 DIAGNOSIS — D2372 Other benign neoplasm of skin of left lower limb, including hip: Secondary | ICD-10-CM

## 2023-09-21 NOTE — Progress Notes (Signed)
 He presents today with his wife chief complaint of painful elongated toenails and calluses.  Objective: Pulses are palpable.  There is no erythema edema salines drainage or odor multiple callosities to the plantar aspect of the bilateral foot benign in nature.  No open lesions or wounds.  Toenails are long thick yellow dystrophic onychomycotic.  Assessment: Pain in limb secondary to benign skin lesions and mycotic nails.  Plan: Debridement of toenails 1 through 5 bilateral.  Also debrided benign skin lesions.

## 2023-09-24 ENCOUNTER — Other Ambulatory Visit: Payer: Self-pay | Admitting: Cardiology

## 2023-10-09 ENCOUNTER — Other Ambulatory Visit: Payer: Self-pay | Admitting: Cardiology

## 2023-10-11 NOTE — Telephone Encounter (Signed)
 *  STAT* If patient is at the pharmacy, call can be transferred to refill team.   1. Which medications need to be refilled? (please list name of each medication and dose if known)   ezetimibe  (ZETIA ) 10 MG tablet    metoprolol  succinate (TOPROL -XL) 25 MG 24 hr tablet    2. Would you like to learn more about the convenience, safety, & potential cost savings by using the [facility]? No      3. Are you open to using the [facility] (Type [facility]. ). No    4. Which pharmacy/location (including street and city if local pharmacy) is medication to be sent to? [facility] - [place], [place] - [address]     5. Do they need a 30 day or 90 day supply? 30 days    Patient is out of medications

## 2023-10-25 ENCOUNTER — Other Ambulatory Visit: Payer: Self-pay | Admitting: Family Medicine

## 2023-10-27 ENCOUNTER — Encounter: Payer: Self-pay | Admitting: *Deleted

## 2023-10-28 ENCOUNTER — Telehealth: Payer: Self-pay | Admitting: Family Medicine

## 2023-10-28 NOTE — Telephone Encounter (Signed)
 Copied from CRM [id number]. Topic: Medical Record Request - Records Request >> Oct 28, 2023 12:29 PM [place] J wrote: Reason for CRM: Patient's is calling to verify what immunizations he has up to date. He is mostly concerned about whether or not he's had flu vaccination.

## 2023-10-29 ENCOUNTER — Encounter: Payer: Self-pay | Admitting: Cardiology

## 2023-10-29 ENCOUNTER — Ambulatory Visit: Attending: Cardiology | Admitting: Cardiology

## 2023-10-29 VITALS — BP 138/72 | HR 51 | Ht 68.0 in | Wt 210.0 lb

## 2023-10-29 DIAGNOSIS — E785 Hyperlipidemia, unspecified: Secondary | ICD-10-CM

## 2023-10-29 DIAGNOSIS — I10 Essential (primary) hypertension: Secondary | ICD-10-CM | POA: Diagnosis not present

## 2023-10-29 DIAGNOSIS — I2510 Atherosclerotic heart disease of native coronary artery without angina pectoris: Secondary | ICD-10-CM

## 2023-10-29 NOTE — Telephone Encounter (Signed)
 Patient was advised that he is due for flu vaccine.

## 2023-10-29 NOTE — Patient Instructions (Signed)
 Medication Instructions:  Your physician recommends that you continue on your current medications as directed. Please refer to the Current Medication list given to you today.  *If you need a refill on your cardiac medications before your next appointment, please call your pharmacy*  Lab Work: Lipids If you have labs (blood work) drawn today and your tests are completely normal, you will receive your results only by: MyChart Message (if you have MyChart) OR A paper copy in the mail If you have any lab test that is abnormal or we need to change your treatment, we will call you to review the results.    Follow-Up: At [facility], you and your health needs are our priority.  As part of our continuing mission to provide you with exceptional heart care, our providers are all part of one team.  This team includes your primary Cardiologist (physician) and Advanced Practice Providers or APPs (Physician Assistants and Nurse Practitioners) who all work together to provide you with the care you need, when you need it.  Your next appointment:   1 year(s)  Provider:

## 2023-11-01 DIAGNOSIS — E785 Hyperlipidemia, unspecified: Secondary | ICD-10-CM | POA: Diagnosis not present

## 2023-11-01 DIAGNOSIS — I2510 Atherosclerotic heart disease of native coronary artery without angina pectoris: Secondary | ICD-10-CM | POA: Diagnosis not present
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
## 2023-11-02 ENCOUNTER — Ambulatory Visit: Payer: Self-pay | Admitting: Cardiology

## 2023-11-02 LAB — LIPID PANEL
Chol/HDL Ratio: 2.8 ratio (ref 0.0–5.0)
Cholesterol, Total: 135 mg/dL (ref 100–199)
HDL: 49 mg/dL (ref >39)
LDL Chol Calc (NIH): 73 mg/dL (ref 0–99)
Triglycerides: 60 mg/dL (ref 0–149)
VLDL Cholesterol Cal: 13 mg/dL (ref 5–40)

## 2023-11-04 ENCOUNTER — Ambulatory Visit (INDEPENDENT_AMBULATORY_CARE_PROVIDER_SITE_OTHER): Admitting: *Deleted

## 2023-11-04 VITALS — Ht 68.0 in | Wt 201.0 lb

## 2023-11-04 DIAGNOSIS — Z0000 Encounter for general adult medical examination without abnormal findings: Secondary | ICD-10-CM | POA: Diagnosis not present

## 2023-11-04 NOTE — Patient Instructions (Addendum)
 Mr. Matton , Thank you for taking time out of your busy schedule to complete your Annual Wellness Visit with me. I enjoyed our conversation and look forward to speaking with you again next year. I, as well as your care team,  appreciate your ongoing commitment to your health goals. Please review the following plan we discussed and let me know if I can assist you in the future. Your Game plan/ To Do List    The web address for mychart access on your desktop is:  [email or web address].  Choose forgot username to reset that.  Choose forgot password word to reset that. Hope this helps!  Follow up Visits: Next Medicare AWV with our clinical staff:  11/07/24 9am, telephone  Next Office Visit with your provider: 01/31/24 2:40pm, Dr Domenica  Clinician Recommendations:  Aim for 30 minutes of exercise or brisk walking, 6-8 glasses of water, and 5 servings of fruits and vegetables each day.   You will need to get the following vaccines at your local pharmacy or at [facility]:  Flu      This is a list of the screening recommended for you and due dates:  Health Maintenance  Topic Date Due   Flu Shot  09/03/2023   COVID-19 Vaccine (7 - Moderna risk 2024-25 season) 10/04/2023   Medicare Annual Wellness Visit  02/02/2024*   DTaP/Tdap/Td vaccine (6 - Td or Tdap) 09/27/2032   Pneumococcal Vaccine for age over 29  Completed   Zoster (Shingles) Vaccine  Completed   HPV Vaccine  Aged Out   Meningitis B Vaccine  Aged Out  *Topic was postponed. The date shown is not the original due date.    Advanced directives: (In Chart) A copy of your advanced directives are scanned into your chart should your provider ever need it. Advance Care Planning is important because it:  [x]  Makes sure you receive the medical care that is consistent with your values, goals, and preferences  [x]  It provides guidance to your family and loved ones and reduces their decisional burden about whether or not they are making the  right decisions based on your wishes.  Follow the link provided in your after visit summary or read over the paperwork we have mailed to you to help you started getting your Advance Directives in place. If you need assistance in completing these, please reach out to us  so that we can help you!  See attachments for Preventive Care and Fall Prevention Tips.
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
## 2023-11-09 NOTE — Telephone Encounter (Signed)
 Results/message from Dr. Emmette Harms has been released to MyChart. A letter is being sent to the last known home address.

## 2023-11-17 ENCOUNTER — Other Ambulatory Visit: Payer: Self-pay | Admitting: Cardiology

## 2023-11-17 ENCOUNTER — Other Ambulatory Visit: Payer: Self-pay | Admitting: Family Medicine

## 2023-11-17 DIAGNOSIS — I2510 Atherosclerotic heart disease of native coronary artery without angina pectoris: Secondary | ICD-10-CM

## 2023-11-17 DIAGNOSIS — E785 Hyperlipidemia, unspecified: Secondary | ICD-10-CM

## 2023-11-18 ENCOUNTER — Telehealth: Payer: Self-pay | Admitting: Cardiology

## 2023-11-18 DIAGNOSIS — N529 Male erectile dysfunction, unspecified: Secondary | ICD-10-CM | POA: Diagnosis not present

## 2023-11-18 DIAGNOSIS — N393 Stress incontinence (female) (male): Secondary | ICD-10-CM | POA: Diagnosis not present

## 2023-11-18 DIAGNOSIS — C61 Malignant neoplasm of prostate: Secondary | ICD-10-CM | POA: Diagnosis not present

## 2023-11-18 NOTE — Telephone Encounter (Signed)
*  STAT* If patient is at the pharmacy, call can be transferred to refill team.   1. Which medications need to be refilled? (please list name of each medication and dose if known) Evolocumab  (REPATHA  SURECLICK) 140 MG/ML SOAJ    2. Would you like to learn more about the convenience, safety, & potential cost savings by using the [facility]? No   3. Are you open to using the [facility] (Type [facility] No   4. Which pharmacy/location (including street and city if local pharmacy) is medication to be sent to?  [facility] - [place], [place] - [address]   5. Do they need a 30 day or 90 day supply?

## 2023-11-19 MED ORDER — REPATHA SURECLICK 140 MG/ML ~~LOC~~ SOAJ: 140.0000 mg | SUBCUTANEOUS | 3 refills | Status: AC

## 2023-12-13 DIAGNOSIS — L821 Other seborrheic keratosis: Secondary | ICD-10-CM | POA: Diagnosis not present

## 2023-12-13 DIAGNOSIS — L570 Actinic keratosis: Secondary | ICD-10-CM | POA: Diagnosis not present

## 2023-12-13 DIAGNOSIS — D225 Melanocytic nevi of trunk: Secondary | ICD-10-CM | POA: Diagnosis not present

## 2023-12-13 DIAGNOSIS — Z85828 Personal history of other malignant neoplasm of skin: Secondary | ICD-10-CM | POA: Diagnosis not present

## 2023-12-15 DIAGNOSIS — R739 Hyperglycemia, unspecified: Secondary | ICD-10-CM | POA: Diagnosis not present

## 2023-12-15 DIAGNOSIS — E669 Obesity, unspecified: Secondary | ICD-10-CM | POA: Diagnosis not present

## 2023-12-15 DIAGNOSIS — N393 Stress incontinence (female) (male): Secondary | ICD-10-CM | POA: Diagnosis not present

## 2023-12-15 DIAGNOSIS — I2510 Atherosclerotic heart disease of native coronary artery without angina pectoris: Secondary | ICD-10-CM | POA: Diagnosis not present

## 2023-12-15 DIAGNOSIS — K219 Gastro-esophageal reflux disease without esophagitis: Secondary | ICD-10-CM | POA: Diagnosis not present

## 2023-12-15 DIAGNOSIS — Z8249 Family history of ischemic heart disease and other diseases of the circulatory system: Secondary | ICD-10-CM | POA: Diagnosis not present

## 2023-12-15 DIAGNOSIS — E785 Hyperlipidemia, unspecified: Secondary | ICD-10-CM | POA: Diagnosis not present

## 2023-12-15 DIAGNOSIS — J439 Emphysema, unspecified: Secondary | ICD-10-CM | POA: Diagnosis not present

## 2023-12-15 DIAGNOSIS — Z8546 Personal history of malignant neoplasm of prostate: Secondary | ICD-10-CM | POA: Diagnosis not present

## 2023-12-15 DIAGNOSIS — I10 Essential (primary) hypertension: Secondary | ICD-10-CM | POA: Diagnosis not present

## 2023-12-15 DIAGNOSIS — Z87891 Personal history of nicotine dependence: Secondary | ICD-10-CM | POA: Diagnosis not present

## 2023-12-15 DIAGNOSIS — Z7982 Long term (current) use of aspirin: Secondary | ICD-10-CM | POA: Diagnosis not present

## 2023-12-15 DIAGNOSIS — Z880 Allergy status to penicillin: Secondary | ICD-10-CM | POA: Diagnosis not present

## 2023-12-21 ENCOUNTER — Ambulatory Visit: Admitting: Podiatry

## 2023-12-21 ENCOUNTER — Encounter: Payer: Self-pay | Admitting: Podiatry

## 2023-12-21 DIAGNOSIS — D2372 Other benign neoplasm of skin of left lower limb, including hip: Secondary | ICD-10-CM | POA: Diagnosis not present

## 2023-12-21 DIAGNOSIS — M79675 Pain in left toe(s): Secondary | ICD-10-CM | POA: Diagnosis not present

## 2023-12-21 DIAGNOSIS — M79674 Pain in right toe(s): Secondary | ICD-10-CM | POA: Diagnosis not present

## 2023-12-21 DIAGNOSIS — B351 Tinea unguium: Secondary | ICD-10-CM | POA: Diagnosis not present

## 2023-12-21 DIAGNOSIS — D2371 Other benign neoplasm of skin of right lower limb, including hip: Secondary | ICD-10-CM

## 2023-12-21 NOTE — Progress Notes (Signed)
 He presents today with his wife chief complaint of painful elongated toenails and calluses.  Objective: Pulses are palpable.  There is no erythema edema salines drainage or odor multiple callosities to the plantar aspect of the bilateral foot benign in nature.  No open lesions or wounds.  Toenails are long thick yellow dystrophic onychomycotic.  Assessment: Pain in limb secondary to benign skin lesions and mycotic nails.  Plan: Debridement of toenails 1 through 5 bilateral.  Also debrided benign skin lesions.

## 2023-12-22 ENCOUNTER — Ambulatory Visit: Admitting: Podiatry

## 2023-12-23 ENCOUNTER — Telehealth: Payer: Self-pay | Admitting: Cardiology

## 2023-12-27 DIAGNOSIS — H43813 Vitreous degeneration, bilateral: Secondary | ICD-10-CM | POA: Diagnosis not present

## 2023-12-27 DIAGNOSIS — H353131 Nonexudative age-related macular degeneration, bilateral, early dry stage: Secondary | ICD-10-CM | POA: Diagnosis not present

## 2023-12-27 DIAGNOSIS — H26492 Other secondary cataract, left eye: Secondary | ICD-10-CM | POA: Diagnosis not present

## 2023-12-27 DIAGNOSIS — H35373 Puckering of macula, bilateral: Secondary | ICD-10-CM | POA: Diagnosis not present

## 2023-12-27 DIAGNOSIS — Z961 Presence of intraocular lens: Secondary | ICD-10-CM | POA: Diagnosis not present

## 2023-12-28 ENCOUNTER — Other Ambulatory Visit: Payer: Self-pay

## 2023-12-28 MED ORDER — SPIRONOLACTONE 25 MG PO TABS: 12.5000 mg | ORAL_TABLET | Freq: Every day | ORAL | 3 refills | Status: AC

## 2023-12-28 MED ORDER — SPIRONOLACTONE 25 MG PO TABS: 12.5000 mg | ORAL_TABLET | Freq: Every day | ORAL | 3 refills | Status: DC

## 2023-12-28 NOTE — Addendum Note (Signed)
 Addended by: JOSHUA ANDREZ PARAS on: 12/28/2023 11:07 AM   Modules accepted: Orders

## 2023-12-28 NOTE — Progress Notes (Signed)
 Refill for spironolactone  (ALDACTONE ) 25 MG tablet  sent to [facility].

## 2023-12-28 NOTE — Telephone Encounter (Signed)
*  STAT* If patient is at the pharmacy, call can be transferred to refill team.   1. Which medications need to be refilled? (please list name of each medication and dose if known)   spironolactone  (ALDACTONE ) 25 MG tablet   2. Would you like to learn more about the convenience, safety, & potential cost savings by using the [facility]?   3. Are you open to using the [facility] (Type [facility]. ).  4. Which pharmacy/location (including street and city if local pharmacy) is medication to be sent to?  [facility] - [place], [place] - [address]   5. Do they need a 30 day or 90 day supply?   Caller Pleasant) stated patient is almost out of this medication.

## 2023-12-31 ENCOUNTER — Other Ambulatory Visit: Payer: Self-pay | Admitting: Cardiology

## 2024-01-05 ENCOUNTER — Telehealth: Payer: Self-pay | Admitting: Cardiology

## 2024-01-05 NOTE — Telephone Encounter (Signed)
*  STAT* If patient is at the pharmacy, call can be transferred to refill team.   1. Which medications need to be refilled? (please list name of each medication and dose if known)   isosorbide  mononitrate (IMDUR ) 30 MG 24 hr tablet    2. Which pharmacy/location (including street and city if local pharmacy) is medication to be sent to?  [facility] - [place], [place] - [address]      3. Do they need a 30 day or 90 day supply? 90 day    Pt is out of medication

## 2024-01-06 NOTE — Telephone Encounter (Signed)
 Refill sent

## 2024-01-15 ENCOUNTER — Other Ambulatory Visit: Payer: Self-pay | Admitting: Family Medicine

## 2024-01-30 NOTE — Assessment & Plan Note (Signed)
 hgba1c acceptable, minimize simple carbs. Increase exercise as tolerated.

## 2024-01-30 NOTE — Assessment & Plan Note (Signed)
 Well controlled, no changes to meds. Encouraged heart healthy diet such as the DASH diet and exercise as tolerated.

## 2024-01-30 NOTE — Assessment & Plan Note (Signed)
 Her BMI today is 46. Encouraged ongoing weight-loss efforts, as even modest reductions can significantly improve overall health. Encouraged DASH or MIND diet, decrease po intake and increase exercise as tolerated. Needs 7-8 hours of sleep nightly. Avoid trans fats, eat small, frequent meals every 4-5 hours with lean proteins, complex carbs and healthy fats. Minimize simple carbs, high fat foods and processed foods

## 2024-01-30 NOTE — Assessment & Plan Note (Signed)
 Tolerating repatha , maintain heart healthy diet such as MIND or DASH diet, increase exercise, avoid trans fats, simple carbohydrates and processed foods, consider a krill or fish or flaxseed oil cap daily.
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
## 2024-01-30 NOTE — Assessment & Plan Note (Signed)
Does not tolerate statins is on repatha

## 2024-01-31 ENCOUNTER — Ambulatory Visit (INDEPENDENT_AMBULATORY_CARE_PROVIDER_SITE_OTHER): Admitting: Family Medicine

## 2024-01-31 ENCOUNTER — Encounter: Payer: Self-pay | Admitting: Family Medicine

## 2024-01-31 VITALS — BP 128/70 | HR 52 | Temp 97.5°F | Resp 16 | Ht 68.0 in | Wt 201.0 lb

## 2024-01-31 DIAGNOSIS — R7989 Other specified abnormal findings of blood chemistry: Secondary | ICD-10-CM | POA: Diagnosis not present

## 2024-01-31 DIAGNOSIS — I10 Essential (primary) hypertension: Secondary | ICD-10-CM

## 2024-01-31 DIAGNOSIS — E785 Hyperlipidemia, unspecified: Secondary | ICD-10-CM

## 2024-01-31 DIAGNOSIS — G720 Drug-induced myopathy: Secondary | ICD-10-CM

## 2024-01-31 DIAGNOSIS — E669 Obesity, unspecified: Secondary | ICD-10-CM

## 2024-01-31 DIAGNOSIS — T466X5A Adverse effect of antihyperlipidemic and antiarteriosclerotic drugs, initial encounter: Secondary | ICD-10-CM

## 2024-01-31 DIAGNOSIS — R739 Hyperglycemia, unspecified: Secondary | ICD-10-CM

## 2024-01-31 MED ORDER — SERTRALINE HCL 50 MG PO TABS: ORAL_TABLET | ORAL | 3 refills | Status: AC

## 2024-02-01 ENCOUNTER — Ambulatory Visit: Payer: Self-pay | Admitting: Family Medicine

## 2024-02-01 ENCOUNTER — Encounter: Payer: Self-pay | Admitting: Family Medicine

## 2024-02-01 DIAGNOSIS — E559 Vitamin D deficiency, unspecified: Secondary | ICD-10-CM

## 2024-02-01 LAB — COMPREHENSIVE METABOLIC PANEL WITH GFR
ALT: 18 U/L (ref 3–53)
AST: 23 U/L (ref 5–37)
Albumin: 4.1 g/dL (ref 3.5–5.2)
Alkaline Phosphatase: 32 U/L — ABNORMAL LOW (ref 39–117)
BUN: 20 mg/dL (ref 6–23)
CO2: 30 meq/L (ref 19–32)
Calcium: 9.4 mg/dL (ref 8.4–10.5)
Chloride: 105 meq/L (ref 96–112)
Creatinine, Ser: 1.16 mg/dL (ref 0.40–1.50)
GFR: 57.03 mL/min — ABNORMAL LOW
Glucose, Bld: 105 mg/dL — ABNORMAL HIGH (ref 70–99)
Potassium: 4.3 meq/L (ref 3.5–5.1)
Sodium: 141 meq/L (ref 135–145)
Total Bilirubin: 0.4 mg/dL (ref 0.2–1.2)
Total Protein: 6.4 g/dL (ref 6.0–8.3)

## 2024-02-01 LAB — CBC WITH DIFFERENTIAL/PLATELET
Basophils Absolute: 0.1 K/uL (ref 0.0–0.1)
Basophils Relative: 0.7 % (ref 0.0–3.0)
Eosinophils Absolute: 0.3 K/uL (ref 0.0–0.7)
Eosinophils Relative: 3.2 % (ref 0.0–5.0)
HCT: 38.2 % — ABNORMAL LOW (ref 39.0–52.0)
Hemoglobin: 13.2 g/dL (ref 13.0–17.0)
Lymphocytes Relative: 17.7 % (ref 12.0–46.0)
Lymphs Abs: 1.4 K/uL (ref 0.7–4.0)
MCHC: 34.5 g/dL (ref 30.0–36.0)
MCV: 87.6 fl (ref 78.0–100.0)
Monocytes Absolute: 0.7 K/uL (ref 0.1–1.0)
Monocytes Relative: 8.3 % (ref 3.0–12.0)
Neutro Abs: 5.6 K/uL (ref 1.4–7.7)
Neutrophils Relative %: 70.1 % (ref 43.0–77.0)
Platelets: 173 K/uL (ref 150.0–400.0)
RBC: 4.36 Mil/uL (ref 4.22–5.81)
RDW: 14.3 % (ref 11.5–15.5)
WBC: 8 K/uL (ref 4.0–10.5)

## 2024-02-01 LAB — TSH: TSH: 1.75 u[IU]/mL (ref 0.35–5.50)

## 2024-02-01 LAB — LIPID PANEL
Cholesterol: 121 mg/dL (ref 28–200)
HDL: 42 mg/dL
LDL Cholesterol: 61 mg/dL (ref 10–99)
NonHDL: 78.9
Total CHOL/HDL Ratio: 3
Triglycerides: 91 mg/dL (ref 10.0–149.0)
VLDL: 18.2 mg/dL (ref 0.0–40.0)

## 2024-02-01 LAB — HEMOGLOBIN A1C: Hgb A1c MFr Bld: 6.2 % (ref 4.6–6.5)

## 2024-02-01 LAB — VITAMIN D 25 HYDROXY (VIT D DEFICIENCY, FRACTURES): VITD: 26.91 ng/mL — ABNORMAL LOW (ref 30.00–100.00)

## 2024-02-02 MED ORDER — VITAMIN D (ERGOCALCIFEROL) 1.25 MG (50000 UNIT) PO CAPS: 50000.0000 [IU] | ORAL_CAPSULE | ORAL | 4 refills | Status: AC

## 2024-02-28 ENCOUNTER — Other Ambulatory Visit: Payer: Self-pay | Admitting: Family Medicine

## 2024-02-28 DIAGNOSIS — I10 Essential (primary) hypertension: Secondary | ICD-10-CM

## 2024-03-21 ENCOUNTER — Ambulatory Visit: Admitting: Podiatry

## 2024-05-15 ENCOUNTER — Ambulatory Visit: Admitting: Family Medicine

## 2024-11-07 ENCOUNTER — Ambulatory Visit
